# Patient Record
Sex: Male | Born: 1980 | Race: White | Hispanic: No | Marital: Married | Smoking: Former smoker
Health system: Southern US, Community
[De-identification: ages and names within clinical notes are randomized; demographics above are authoritative.]

## PROBLEM LIST (undated history)

## (undated) ENCOUNTER — Inpatient Hospital Stay: Admission: EM | Payer: Self-pay | Source: Home / Self Care

## (undated) DIAGNOSIS — F329 Major depressive disorder, single episode, unspecified: Secondary | ICD-10-CM

## (undated) DIAGNOSIS — K219 Gastro-esophageal reflux disease without esophagitis: Secondary | ICD-10-CM

## (undated) DIAGNOSIS — F419 Anxiety disorder, unspecified: Secondary | ICD-10-CM

## (undated) DIAGNOSIS — E039 Hypothyroidism, unspecified: Secondary | ICD-10-CM

## (undated) DIAGNOSIS — A4902 Methicillin resistant Staphylococcus aureus infection, unspecified site: Secondary | ICD-10-CM

## (undated) DIAGNOSIS — F1011 Alcohol abuse, in remission: Secondary | ICD-10-CM

## (undated) DIAGNOSIS — F32A Depression, unspecified: Secondary | ICD-10-CM

## (undated) DIAGNOSIS — A630 Anogenital (venereal) warts: Secondary | ICD-10-CM

## (undated) HISTORY — DX: Major depressive disorder, single episode, unspecified: F32.9

## (undated) HISTORY — DX: Gastro-esophageal reflux disease without esophagitis: K21.9

## (undated) HISTORY — DX: Anxiety disorder, unspecified: F41.9

## (undated) HISTORY — DX: Alcohol abuse, in remission: F10.11

## (undated) HISTORY — DX: Depression, unspecified: F32.A

## (undated) HISTORY — DX: Methicillin resistant Staphylococcus aureus infection, unspecified site: A49.02

## (undated) HISTORY — DX: Hypothyroidism, unspecified: E03.9

## (undated) HISTORY — DX: Anogenital (venereal) warts: A63.0

## (undated) HISTORY — PX: WRIST SURGERY: SHX841

---

## 2001-07-03 ENCOUNTER — Encounter: Admission: RE | Admit: 2001-07-03 | Discharge: 2001-07-03 | Payer: Self-pay | Admitting: Sports Medicine

## 2001-08-11 ENCOUNTER — Encounter: Admission: RE | Admit: 2001-08-11 | Discharge: 2001-08-11 | Payer: Self-pay | Admitting: Sports Medicine

## 2001-09-04 ENCOUNTER — Encounter: Admission: RE | Admit: 2001-09-04 | Discharge: 2001-09-04 | Payer: Self-pay | Admitting: Sports Medicine

## 2001-09-11 ENCOUNTER — Encounter: Admission: RE | Admit: 2001-09-11 | Discharge: 2001-09-11 | Payer: Self-pay | Admitting: Sports Medicine

## 2003-06-14 ENCOUNTER — Encounter: Admission: RE | Admit: 2003-06-14 | Discharge: 2003-06-14 | Payer: Self-pay | Admitting: Sports Medicine

## 2006-03-10 ENCOUNTER — Ambulatory Visit: Payer: Self-pay | Admitting: Gastroenterology

## 2006-03-10 LAB — CONVERTED CEMR LAB
AST: 25 units/L (ref 0–37)
Albumin: 4.4 g/dL (ref 3.5–5.2)
Amylase: 60 units/L (ref 27–131)
BUN: 13 mg/dL (ref 6–23)
Basophils Absolute: 0 10*3/uL (ref 0.0–0.1)
Basophils Relative: 0.6 % (ref 0.0–1.0)
CO2: 32 meq/L (ref 19–32)
Calcium: 9.7 mg/dL (ref 8.4–10.5)
Eosinophils Relative: 1.6 % (ref 0.0–5.0)
GFR calc Af Amer: 151 mL/min
GFR calc non Af Amer: 125 mL/min
HCT: 49.9 % (ref 39.0–52.0)
Lymphocytes Relative: 39 % (ref 12.0–46.0)
MCHC: 35 g/dL (ref 30.0–36.0)
MCV: 91.8 fL (ref 78.0–100.0)
Monocytes Absolute: 0.7 10*3/uL (ref 0.2–0.7)
Monocytes Relative: 12.1 % — ABNORMAL HIGH (ref 3.0–11.0)
Neutrophils Relative %: 46.7 % (ref 43.0–77.0)
Platelets: 240 10*3/uL (ref 150–400)
Potassium: 4 meq/L (ref 3.5–5.1)
RBC: 5.43 M/uL (ref 4.22–5.81)
Sed Rate: 2 mm/hr (ref 0–20)
Sodium: 145 meq/L (ref 135–145)
Tissue Transglutaminase Ab, IgA: <3 units (ref <5)
Total Bilirubin: 0.8 mg/dL (ref 0.3–1.2)
WBC: 6 10*3/uL (ref 4.5–10.5)

## 2006-03-17 ENCOUNTER — Encounter (INDEPENDENT_AMBULATORY_CARE_PROVIDER_SITE_OTHER): Payer: Self-pay | Admitting: Specialist

## 2006-03-17 ENCOUNTER — Ambulatory Visit: Payer: Self-pay | Admitting: Gastroenterology

## 2006-03-17 LAB — CONVERTED CEMR LAB: HCV Ab: NEGATIVE

## 2006-03-21 ENCOUNTER — Ambulatory Visit: Payer: Self-pay | Admitting: Gastroenterology

## 2006-03-31 ENCOUNTER — Ambulatory Visit: Payer: Self-pay | Admitting: Gastroenterology

## 2006-04-01 ENCOUNTER — Ambulatory Visit: Payer: Self-pay | Admitting: Gastroenterology

## 2006-04-25 ENCOUNTER — Ambulatory Visit: Payer: Self-pay | Admitting: Gastroenterology

## 2006-07-01 ENCOUNTER — Ambulatory Visit: Payer: Self-pay | Admitting: Gastroenterology

## 2006-07-01 LAB — CONVERTED CEMR LAB
ALT: 26 units/L (ref 0–40)
AST: 22 units/L (ref 0–37)
Albumin: 4.2 g/dL (ref 3.5–5.2)
Alkaline Phosphatase: 57 units/L (ref 39–117)
Bilirubin, Direct: 0.1 mg/dL (ref 0.0–0.3)
Total Bilirubin: 1 mg/dL (ref 0.3–1.2)
Total Protein: 7 g/dL (ref 6.0–8.3)

## 2010-03-28 ENCOUNTER — Ambulatory Visit: Payer: Self-pay | Admitting: Infectious Diseases

## 2010-03-29 ENCOUNTER — Encounter: Payer: Self-pay | Admitting: *Deleted

## 2010-03-29 DIAGNOSIS — IMO0002 Reserved for concepts with insufficient information to code with codable children: Secondary | ICD-10-CM | POA: Insufficient documentation

## 2010-04-02 ENCOUNTER — Telehealth: Payer: Self-pay | Admitting: Infectious Diseases

## 2010-04-02 ENCOUNTER — Encounter: Payer: Self-pay | Admitting: Infectious Diseases

## 2010-04-02 ENCOUNTER — Ambulatory Visit (INDEPENDENT_AMBULATORY_CARE_PROVIDER_SITE_OTHER): Payer: BC Managed Care – PPO | Admitting: Infectious Diseases

## 2010-04-02 ENCOUNTER — Other Ambulatory Visit: Payer: Self-pay | Admitting: Infectious Diseases

## 2010-04-02 DIAGNOSIS — IMO0002 Reserved for concepts with insufficient information to code with codable children: Secondary | ICD-10-CM

## 2010-04-02 LAB — CONVERTED CEMR LAB
BUN: 23 mg/dL (ref 6–23)
CO2: 28 meq/L (ref 19–32)
Calcium: 9.3 mg/dL (ref 8.4–10.5)
Compl, Total (CH50): >60 — ABNORMAL HIGH (ref 31–60)
Creatinine, Ser: 0.95 mg/dL (ref 0.40–1.50)
Eosinophils Absolute: 0.2 10*3/uL (ref 0.0–0.7)
Eosinophils Relative: 2 % (ref 0–5)
Glucose, Bld: 93 mg/dL (ref 70–99)
HCT: 47 % (ref 39.0–52.0)
HIV 1 RNA Quant: <20 copies/mL (ref <20)
HIV-1 RNA Quant, Log: <1.3 (ref <1.30)
Hemoglobin: 16.6 g/dL (ref 13.0–17.0)
IgA: 219 mg/dL (ref 68–378)
Lymphocytes Relative: 35 % (ref 12–46)
Lymphs Abs: 2.8 10*3/uL (ref 0.7–4.0)
MCV: 89.9 fL (ref 78.0–100.0)
Monocytes Relative: 11 % (ref 3–12)
RBC: 5.23 M/uL (ref 4.22–5.81)
Total Bilirubin: 0.6 mg/dL (ref 0.3–1.2)
WBC: 8 10*3/uL (ref 4.0–10.5)

## 2010-04-03 LAB — T-HELPER CELL (CD4) - (RCID CLINIC ONLY): CD4 T Cell Abs: 1260 uL (ref 400–2700)

## 2010-04-03 NOTE — Miscellaneous (Addendum)
Summary: meds  Medications Added CHANTIX STARTING MONTH PAK 0.5 MG X 11 & 1 MG X 42 TABS (VARENICLINE TARTRATE) per instructions CYMBALTA 30 MG CPEP (DULOXETINE HCL) take 3 caps orally once a day PREDNISONE 5 MG TABS (PREDNISONE) 1 pack was dispensed. take as directed      Allergies Added: NKDA Clinical Lists Changes  Problems: Added new problem of CELLULITIS, ARM (ICD-682.3) Added new problem of DEPRESSIVE DISORDER NOT ELSEWHERE CLASSIFIED (ICD-311) Medications: Added new medication of CHANTIX STARTING MONTH PAK 0.5 MG X 11 & 1 MG X 42 TABS (VARENICLINE TARTRATE) per instructions Added new medication of CYMBALTA 30 MG CPEP (DULOXETINE HCL) take 3 caps orally once a day Added new medication of CEFTIN 500 MG TABS (CEFUROXIME AXETIL) one tab two times a day Added new medication of MUCINEX DM 30-600 MG XR12H-TAB (DEXTROMETHORPHAN-GUAIFENESIN) take one or tabs by mouth twice a day Added new medication of 12 HOUR NASAL SPRAY 0.05 % SOLN (OXYMETAZOLINE HCL) take 2 sprays intranasally twice a day for 3 days. Added new medication of SINUS RINSE KIT  PACK (HYPERTONIC NASAL WASH) take one application in each affected nostril 1 or 2 times a day for 30 days Added new medication of AVELOX ABC PACK 400 MG TABS (MOXIFLOXACIN HCL) take one tablet per day Added new medication of MUCINEX D 60-600 MG XR12H-TAB (PSEUDOEPHEDRINE-GUAIFENESIN) take one tab orally two times a day for stuffines & congestion Added new medication of AUGMENTIN 875-125 MG TABS (AMOXICILLIN-POT CLAVULANATE) 1 tab two times a day Added new medication of PREDNISONE 5 MG TABS (PREDNISONE) 1 pack was dispensed. take as directed Observations: Added new observation of NKA: T (03/29/2010 11:08)

## 2010-04-05 ENCOUNTER — Telehealth: Payer: Self-pay | Admitting: *Deleted

## 2010-04-10 NOTE — Progress Notes (Signed)
Summary: Send rxes to [facility] and answer pt. questions  Phone Note Call from Patient Call back at cell [phone]   Caller: Patient Call For: Johny Sax, MD Reason for Call: Talk to Doctor Summary of Call: Pt. would appreciate the anibiotic ointment and surgical scrub [facility] [address]  Pt. also had a question about his continuing flu-like symptoms.  Is there something he should be doing or taking to relieve the symptoms?  Please call the pt. Jennet Maduro RN  April 02, 2010 3:23 PM   Follow-up for Phone Call        rx's sent electronically. will watch his flu symptoms. can try zyrtec, ibuprofen.     New/Updated Medications: MUPIROCIN 2 % OINT (MUPIROCIN) apply pea sized amt, intrasally, two times a day for the first 5 days of each month for the next 5 months. BETASEPT SURGICAL SCRUB 4 % LIQD (CHLORHEXIDINE GLUCONATE) scrub entire body except face, once weekly Prescriptions: BETASEPT SURGICAL SCRUB 4 % LIQD (CHLORHEXIDINE GLUCONATE) scrub entire body except face, once weekly  #30 days x 3   Entered and Authorized by:   Johny Sax MD   Signed by:   Johny Sax MD on 04/02/2010   Method used:   [facility] to        [facility] [address] [phone]* (retail)       [address]       [place], [place]  [address]       Ph: [phone] or [phone]       Fax: [phone]   RxID:   [id number] MUPIROCIN 2 % OINT (MUPIROCIN) apply pea sized amt, intrasally, two times a day for the first 5 days of each month for the next 5 months.  #30 days x 1   Entered and Authorized by:   Johny Sax MD   Signed by:   Johny Sax MD on 04/02/2010   Method used:   Electronically to        [facility] [address] [phone]* (retail)       [address]       [place], [place]  [address]       Ph: [phone] or [phone]       Fax: [phone]   RxID:   [id number]

## 2010-04-10 NOTE — Assessment & Plan Note (Signed)
Summary: Office Visit - Infectious Disease   CC:  new pt. recurrent MRSA and pt. c/o headache and sorethroat x 2 weeks.  History of Present Illness: 30 yo M with hx of recurrent MRSA infections. prev treated with Avelox, cipro, ceftin, bactrim. Two episodes on forearm, once on his thigh. Has never had a Cx. No fevers or chills, sweating still (from previous viral gastroenteritis).  States he is getting sick once a month- sinus infections, stomach virus.  Soap- "manbar".   Depression History:      The patient denies a depressed mood most of the day and a diminished interest in his usual daily activities.        The patient denies that he feels like life is not worth living, denies that he wishes that he were dead, and denies that he has thought about ending his life.        Preventive Screening-Counseling & Management  Alcohol-Tobacco     Alcohol drinks/day: 0     Smoking Status: quit     Year Quit: 2010  Caffeine-Diet-Exercise     Caffeine use/day: coffee and soda 6 per day     Does Patient Exercise: yes     Type of exercise: lift weights, cardio     Times/week: 3  Comments: pt. uses nicotine lozenges  Safety-Violence-Falls     Seat Belt Use: yes      Drug Use:  no.     Current Allergies (reviewed today): No known allergies  Family History: sister with DM dx at  55 Father with CAD, PTCA  Social History: Single Former Smoker Alcohol use-no Drug use-no Drug Use:  no  Current Medications (verified): 1)  Cymbalta 20 Mg Cpep (Duloxetine Hcl) .... Take 1 Tablet By Mouth Once A Day  Allergies (verified): No Known Drug Allergies   Review of Systems       BM nl, urination nl, has skin "bumps" pimples on chest and pelvis. wt is steady. lifts weights daily.   Vital Signs:  Patient profile:   30 year old male Height:      71 inches (180.34 cm) Weight:      185.8 pounds (84.45 kg) BMI:     26.01 Temp:     98.4 degrees F (36.89 degrees C) oral Pulse rate:   80 /  minute BP sitting:   147 / 81  (right arm)  Vitals Entered By: Wendall Mola CMA Duncan Dull) (April 02, 2010 2:16 PM) CC: new pt. recurrent MRSA, pt. c/o headache and sorethroat x 2 weeks Is Patient Diabetic? No Pain Assessment Patient in pain? no      Nutritional Status BMI of 25 - 29 = overweight Nutritional Status Detail appetite "terrible"  Have you ever been in a relationship where you felt threatened, hurt or afraid?No   Does patient need assistance? Functional Status Self care Ambulation Normal   Physical Exam  General:  well-developed, well-nourished, and well-hydrated.   Eyes:  pupils equal, pupils round, and pupils reactive to light.   Mouth:  pharynx pink and moist and no exudates.   Neck:  no masses.   Lungs:  normal respiratory effort and normal breath sounds.   Heart:  normal rate, regular rhythm, and no murmur.   Abdomen:  soft, non-tender, and normal bowel sounds.   Extremities:  has healed furuncle scar o nihs R inner thigh.    Impression & Recommendations:  Problem # 1:  CELLULITIS, ARM (ICD-682.3)  he does not have any active  lesions currently. WIll start him on mupicocin intranasal two times a day for the first 5 days of the month for the next 3 months. Advised him to use  an antibacterial soap. Offered to write him a rx for chlorhexidine soap but will hold off for He would like to be screened for "everything" with regards to possible recurrent infections. He had a negative HIV 3 months ago at rehab. Will rechck that and check his AB levels and CH50. return to clinic 2 weeks.   Orders: T-HIV Viral Load [phone]) T-Comprehensive Metabolic Panel [phone]) T-CBC w/Diff [phone]) T-CD4SP ([facility]) (CD4SP) T- * Misc. Laboratory test [phone]) Consultation Level IV [phone])  Medications Added to Medication List This Visit: 1)  Cymbalta 20 Mg Cpep (Duloxetine hcl) .... Take 1 tablet by mouth once a day

## 2010-04-10 NOTE — Progress Notes (Signed)
   Phone Note Call from Patient   Caller: Patient Summary of Call: states he has been sick for a wk & it is now moving to his chest. c/o stuffed head & sinus. blowing green mucous. 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 saw ENT 2 yrs ago & was told to "tough it out" wants to be seen. transferred to front to get an appt. told him if none today of tomorrow, use UC. he agreed with plan Initial call taken by: Golden Circle RN,  April 05, 2010 10:16 AM

## 2010-04-13 ENCOUNTER — Telehealth: Payer: Self-pay | Admitting: *Deleted

## 2010-04-13 NOTE — Telephone Encounter (Signed)
C/o L upper arm MRSA. States he has a long hx of this. Wanted to be seen or have something called in. Told him we have no appts & he would need to be seen. He will go to UC. Has appt here early April.Jacob [name], Jacob [name]

## 2010-04-23 ENCOUNTER — Ambulatory Visit (INDEPENDENT_AMBULATORY_CARE_PROVIDER_SITE_OTHER): Payer: BC Managed Care – PPO | Admitting: Infectious Diseases

## 2010-04-23 ENCOUNTER — Encounter: Payer: Self-pay | Admitting: Infectious Diseases

## 2010-04-23 VITALS — BP 151/75 | HR 66 | Temp 98.2°F | Ht 71.0 in | Wt 185.4 lb

## 2010-04-23 DIAGNOSIS — IMO0002 Reserved for concepts with insufficient information to code with codable children: Secondary | ICD-10-CM

## 2010-04-23 NOTE — Assessment & Plan Note (Signed)
He has had a recurnece. Exam of his arm does not show a lesion that i am convinced was a MRSA boil. He needs to continue the bactroban, CHG baths. He is given a prn rx for doxy. Will rtc prn. i have asked him to be seen by a dermatologist or to try topical hydrocortisone for his rash.

## 2010-04-23 NOTE — Progress Notes (Signed)
  Subjective:    Patient ID: LAKSHYA MCGILLICUDDY, male    DOB: 1980-03-19, 30 y.o.   MRN: [id number]  HPI 30 yo M with hx of recurrent MRSA infections which had previously with Avelox, cipro, ceftin and bactrim. Has  Prev had episodes on forearm, thigh. No previous Cxs. He was prev started on (04-02-10) mupirocin, chlorhexidine baths. His Immunoglobulin levels, CH50, HIV RNA were all nl/negative. Had small lesion on his LUE and saw urgent care. Was given 10 days of bactrim and topical (? Can't rember name).  Also recently treated for sinus infection.  Has new rash on his UE over the last 24 hours. No new soaps or medicines (except bactrim). Pruritic.    Review of Systems     Objective:   Physical Exam  Skin: Skin is warm and dry. Rash noted.             Assessment & Plan:

## 2010-06-08 NOTE — Assessment & Plan Note (Signed)
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   HISTORY OF PRESENT ILLNESS:  Slayter is accompanied by his mother today  and I have a somewhat detailed history. He apparently has had a queasy  stomach since childhood. He has also had recurrent episodes of  constipation throughout his life usually when traveling and not watching  his diet. The patient has had a long history of musculoskeletal pains  related to his athleticism and has taken large amounts of Advil which  has probably contributed to his epigastric discomfort. He currently  describes rather typical dyspepsia with reflux symptoms but no dysphagia  or specific hepatobiliary complaints. He was seen at an emergency  medical care center recently. He was found to have a positive H. pylori  antibody and has been taking a Pylora pack with Pepto-Bismol and  antibiotics. He has had no real improvement in his symptoms with this  treatment. Over the last several years he has had recurrent episodes of  dysentery while traveling to [place] and [place], but currently is not  having any bowel problems. He has had 13 pound weight loss over the last  year and he follows a frequent small feeding diet.   The patient has never had an endoscopy or upper GI series. He denies any  specific food intolerances or lactose intolerance.   PAST MEDICAL HISTORY:  Otherwise fairly unremarkable except for a  chronic anxiety disorder and he has been on clonazepam twice a day for  several years. He also takes Adderall daily for attention  deficit  disorder and has recently been on over-the-counter Prilosec. He denies  drug allergies.   FAMILY HISTORY:  Remarkable for peptic ulcer disease in several family  members including his father, mother and siblings. Apparently his father  has some type of abnormal chronic pancreatitis.   SOCIAL HISTORY:  The patient is single and lives with his parents. He  has a Naval architect. He is employed as [employer] of  [employer]. The patient smokes a pack of cigarettes per day and  uses ethanol socially but gives no history of ethanol abuse.   REVIEW OF SYSTEMS:  Noncontributory. He has had acne skin-like rash all  of his life and previously was on doxicycline. He gives no past history  of hepatitis or known pancreatitis. He does have a high stress level and  tends to get GI problems associated with stress. He denies any  hallucinations or delusions or other psychiatric difficulties.   LABORATORY DATA:  He apparently had some lab tests which are not  available a this time. Ultrasound of the abdomen in [place] [facility] was normal on February 28, 2006 per Dr. Onalee Hua  Ormond.   PHYSICAL EXAMINATION:  GENERAL:  He is a somewhat nervous but healthy-  appearing, white male in no acute distress. He does not have stigmata of  chronic liver disease but does have a fine acneiform rash over his back.  VITAL SIGNS:  He is 5 feet 10 inches tall and weighs 150 pounds. Blood  pressure 122/78, pulse was 88 and regular.  HEENT:  I could not appreciate lymphadenopathy or thyromegaly.  CHEST:  Clear.  CARDIAC:  Regular rhythm without murmur, gallop or rub.  ABDOMEN:  I could not appreciate hepatosplenomegaly, abdominal masses or  tenderness. Bowel sounds were normal.  EXTREMITIES:  Peripheral extremities were unremarkable.  NEUROLOGIC:  Mental status was clear.  RECTAL:  Deferred at this time.   ASSESSMENT:  1. The patient's discomfort seems to be localized  in the subxiphoid      area and I suspect he has chronic reflux esophagitis with an      associated hiatal hernia.  2. I doubt that the patient has active H. pylori infection or peptic      ulcer disease but this certainly is a possibility with his NSAID      use.  3. 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  4. History of recurrent dysentery with his travels to [place] and      [place]. He does need to be screened for intestinal parasites.  5. History of chronic anxiety syndrome with a diagnosis of adult      attention deficit disorder.   RECOMMENDATIONS:  1. Screen and laboratory parameters.  2. Stool examinations for parasites and culture.  3. Discontinue H. pylori treatment and placed him on Nexium 40 mg a      day.  4. Endoscopic exam with small bowel biopsy and CLOtest as soon as      possible.  5. Consider colonoscopy exam.  6. Avoidance of NSAIDs and salicylates if at all possible.  7. The patient will need smoking cessation counseling once      gastrointestinal workup is completed.     Vania Rea. Jarold Motto, MD, Caleen Essex, FAGA  Electronically Signed    DRP/MedQ  DD: 03/10/2006  DT: 03/10/2006  Job #: [id number]

## 2010-06-08 NOTE — Assessment & Plan Note (Signed)
[facility]                         GASTROENTEROLOGY OFFICE NOTE   DETRON, CARRAS                  MRN:          [id number]  DATE:04/25/2006                            DOB:          1980/02/09    Jacob [name] is fairly asymptomatic in terms of his GI complaints. 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 Since he is a heterozygote, he really has no risk of  hemachromatosis or iron overload but I have given him a copy of the  report for his information and for genetic counseling.   The patient is on Adderall 40-60 mg a day and Clonazepam 1.5 mg a day  for attention deficit disorder and anxiety attacks. He wishes to come  off of these medications, and I have given him Dr. Dawayne Cirri card,  arranged a visit with Dr. Dellia [name] to counsel him along these lines. I  will have him repeat his liver function tests in 3 months' time with  continued absence from alcohol.     Jacob [name]. Jacob Motto, MD, Caleen Essex, FAGA  Electronically Signed    Jacob [name]  DD: 04/25/2006  DT: 04/25/2006  Job #: [id number]   cc:   Jacob Hua L. Dellia Cloud, Jacob [name]

## 2010-12-27 ENCOUNTER — Telehealth: Payer: Self-pay | Admitting: *Deleted

## 2010-12-27 DIAGNOSIS — Z0000 Encounter for general adult medical examination without abnormal findings: Secondary | ICD-10-CM

## 2010-12-27 NOTE — Telephone Encounter (Signed)
Received staff msg pt made cpx appt need labs. Entered in EPIC....12/27/10@12 :05am/LMB

## 2010-12-31 ENCOUNTER — Other Ambulatory Visit (INDEPENDENT_AMBULATORY_CARE_PROVIDER_SITE_OTHER): Payer: BC Managed Care – PPO

## 2010-12-31 DIAGNOSIS — Z0000 Encounter for general adult medical examination without abnormal findings: Secondary | ICD-10-CM

## 2010-12-31 LAB — LIPID PANEL
HDL: 62.7 mg/dL (ref >39.00)
Total CHOL/HDL Ratio: 3
Triglycerides: 64 mg/dL (ref 0.0–149.0)

## 2010-12-31 LAB — URINALYSIS, ROUTINE W REFLEX MICROSCOPIC
Hgb urine dipstick: NEGATIVE
Nitrite: NEGATIVE
Specific Gravity, Urine: 1.025 (ref 1.000–1.030)
Total Protein, Urine: NEGATIVE
Urobilinogen, UA: 0.2 (ref 0.0–1.0)

## 2010-12-31 LAB — CBC WITH DIFFERENTIAL/PLATELET
Eosinophils Relative: 2.9 % (ref 0.0–5.0)
Lymphocytes Relative: 49 % — ABNORMAL HIGH (ref 12.0–46.0)
MCV: 94.2 fl (ref 78.0–100.0)
Monocytes Absolute: 0.9 10*3/uL (ref 0.1–1.0)
Neutrophils Relative %: 34.2 % — ABNORMAL LOW (ref 43.0–77.0)
Platelets: 220 10*3/uL (ref 150.0–400.0)
WBC: 6.4 10*3/uL (ref 4.5–10.5)

## 2010-12-31 LAB — HEPATIC FUNCTION PANEL
AST: 28 U/L (ref 0–37)
Albumin: 4.1 g/dL (ref 3.5–5.2)
Alkaline Phosphatase: 65 U/L (ref 39–117)
Total Protein: 6.7 g/dL (ref 6.0–8.3)

## 2010-12-31 LAB — BASIC METABOLIC PANEL
BUN: 22 mg/dL (ref 6–23)
Calcium: 9 mg/dL (ref 8.4–10.5)
Chloride: 106 mEq/L (ref 96–112)
Creatinine, Ser: 0.9 mg/dL (ref 0.4–1.5)
GFR: 110.54 mL/min (ref >60.00)

## 2010-12-31 LAB — TSH: TSH: 6.24 u[IU]/mL — ABNORMAL HIGH (ref 0.35–5.50)

## 2011-01-02 ENCOUNTER — Encounter: Payer: Self-pay | Admitting: Internal Medicine

## 2011-01-02 ENCOUNTER — Ambulatory Visit (INDEPENDENT_AMBULATORY_CARE_PROVIDER_SITE_OTHER): Payer: BC Managed Care – PPO | Admitting: Internal Medicine

## 2011-01-02 VITALS — BP 110/82 | HR 74 | Temp 98.4°F | Ht 70.0 in | Wt 187.4 lb

## 2011-01-02 DIAGNOSIS — F329 Major depressive disorder, single episode, unspecified: Secondary | ICD-10-CM

## 2011-01-02 DIAGNOSIS — D72820 Lymphocytosis (symptomatic): Secondary | ICD-10-CM

## 2011-01-02 DIAGNOSIS — Z23 Encounter for immunization: Secondary | ICD-10-CM

## 2011-01-02 DIAGNOSIS — F32A Depression, unspecified: Secondary | ICD-10-CM

## 2011-01-02 DIAGNOSIS — Z0000 Encounter for general adult medical examination without abnormal findings: Secondary | ICD-10-CM

## 2011-01-02 DIAGNOSIS — R946 Abnormal results of thyroid function studies: Secondary | ICD-10-CM

## 2011-01-02 DIAGNOSIS — A4902 Methicillin resistant Staphylococcus aureus infection, unspecified site: Secondary | ICD-10-CM

## 2011-01-02 MED ORDER — SULFAMETHOXAZOLE-TRIMETHOPRIM 800-160 MG PO TABS: 1.0000 | ORAL_TABLET | Freq: Two times a day (BID) | ORAL | Status: DC

## 2011-01-02 NOTE — Patient Instructions (Signed)
It was good to see you today. Exam and labs overall look normal - will recheck thyroid and cbc in 3 months (lab only) Flu and Tdap shot today Refill Bactrim as needed for MRSA outbreaks Other medications as reviewed - no changes - Please schedule followup annually for labs and physical, call sooner if problems.

## 2011-01-02 NOTE — Progress Notes (Signed)
Subjective:    Patient ID: Jacob [name], male    DOB: January 30, 1980, 30 y.o.   MRN: [id number]  HPI patient is here today for annual physical. Patient feels well and has no complaints.  Reviewed chronic medical issues: Colonization with community acquired MRSA. Recurrent skin infections 3-4 times annually due to same - treated with Septra as needed and has been evaluated by ID for same. Not compliance with nasal treatment monthly as recommended by ID, uses Hibiclens as needed. No current skin problem  Depression. Overlaps with history of alcohol and substance abuse. Sober since January 2009. On Cymbalta since that time and works with substance counselor in [place], [place]. Denies active symptoms and would like to wean off medication but we'll continue to work with counselor on same   Past Medical History  Diagnosis Date  . H/O alcohol abuse   . Depression   . GERD (gastroesophageal reflux disease)   . Genital warts   . 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 Heart disease Father   . Hyperlipidemia Father   . Hypertension Father   . Diabetes Other   . Stroke Other   . Heart disease Other    History  Substance Use Topics  . Smoking status: Former Smoker    Types: Cigarettes  . Smokeless tobacco: Never Used  . Alcohol Use: No     Review of Systems Constitutional: Negative for fever or weight change.  Respiratory: Negative for cough and shortness of breath.   Cardiovascular: Negative for chest pain or palpitations.  Gastrointestinal: Negative for abdominal pain, no bowel changes.  Musculoskeletal: Negative for gait problem or joint swelling.  Skin: Negative for rash.  Neurological: Negative for dizziness or headache.  No other specific complaints in a complete review of systems (except as listed in HPI above).     Objective:   Physical Exam BP 110/82   Pulse 74  Temp(Src) 98.4 F (36.9 C) (Oral)  Ht 5\' 10"  (1.778 m)  Wt 187 lb 6.4 oz (85.004 kg)  BMI 26.89 kg/m2  SpO2 98% Wt Readings from Last 3 Encounters:  01/02/11 187 lb 6.4 oz (85.004 kg)  04/23/10 185 lb 6 oz (84.086 kg)  04/02/10 185 lb 12.8 oz (84.278 kg)   Constitutional:  He appears well-developed and well-nourished. No distress.  HEENT: Normocephalic/atraumatic, PERRLA. Tympanic membranes clear without effusion or erythema. Hearing grossly intact. Oropharynx clear without erythema or exudate Neck: Normal range of motion. Neck supple. No JVD or LAD present. No thyromegaly present.  Cardiovascular: Normal rate, regular rhythm and normal heart sounds.  No murmur heard. no BLE edema Pulmonary/Chest: Effort normal and breath sounds normal. No respiratory distress. no wheezes.  Abdominal: Soft. Bowel sounds are normal. Patient exhibits no distension. There is no tenderness.  Musculoskeletal: Normal range of motion. Patient exhibits no edema.  Neurological: he is alert and oriented to person, place, and time. No cranial nerve deficit. Coordination normal.  Skin: small scattered keloids on anterior chest - no purulence or cellulitis - remaining skin is warm and dry.  No erythema or ulceration.  Psychiatric: he has a normal mood and affect. behavior is normal. Judgment and thought content normal.   Lab Results  Component Value Date   WBC 6.4 12/31/2010   HGB 16.2 12/31/2010   HCT 46.7 12/31/2010   PLT 220.0 12/31/2010   GLUCOSE 91  12/31/2010   CHOL 180 12/31/2010   TRIG 64.0 12/31/2010   HDL 62.70 12/31/2010   LDLCALC 105* 12/31/2010   ALT 28 12/31/2010   AST 28 12/31/2010   NA 141 12/31/2010   K 4.4 12/31/2010   CL 106 12/31/2010   CREATININE 0.9 12/31/2010   BUN 22 12/31/2010   CO2 27 12/31/2010   TSH 6.24* 12/31/2010       Assessment & Plan:  CPX - v70.0 -Patient has been counseled on age-appropriate routine health concerns for screening and prevention. These are  reviewed and up-to-date. Immunizations are up-to-date or declined. Labs  reviewed.  Abnormal TSH -reports multiple herbal and energy supplement use for health. Advised cessation of any herbal medications. In the absence of active symptoms, will recheck TSH in 3 months to monitor for evolving hypothyroidism, but no treatment to begin at this time

## 2011-01-03 ENCOUNTER — Encounter: Payer: Self-pay | Admitting: Internal Medicine

## 2011-01-03 DIAGNOSIS — A4902 Methicillin resistant Staphylococcus aureus infection, unspecified site: Secondary | ICD-10-CM | POA: Insufficient documentation

## 2011-01-03 NOTE — Assessment & Plan Note (Signed)
On Cymbalta since 2009 to help combat depression symptoms following sobriety Works with Nena Jordan substance counselor in [place] for same Currently at 5 month interval visits, has weaned down off Max dose Cymbalta over past several years Expresses interest in medication cessation but will continue to work with his counselor/provider on this issue No changes recommended at this time

## 2011-01-03 NOTE — Assessment & Plan Note (Signed)
History reviewed including ID notes spring 2012 in the electronic record. No active infectious symptoms Reviewed importance of compliance with antibiotic nasal cleansing for eradication of colonization if possible Also renewed Septra to use as needed as previously had standing prescription for same from infectious disease specialist

## 2011-01-09 ENCOUNTER — Encounter: Payer: Self-pay | Admitting: Internal Medicine

## 2011-01-09 ENCOUNTER — Ambulatory Visit (INDEPENDENT_AMBULATORY_CARE_PROVIDER_SITE_OTHER): Payer: BC Managed Care – PPO | Admitting: Internal Medicine

## 2011-01-09 VITALS — BP 102/60 | HR 67 | Temp 98.8°F

## 2011-01-09 DIAGNOSIS — K120 Recurrent oral aphthae: Secondary | ICD-10-CM

## 2011-01-09 MED ORDER — TRIAMCINOLONE ACETONIDE 0.1 % MT PSTE: PASTE | Freq: Two times a day (BID) | OROMUCOSAL | Status: DC

## 2011-01-09 NOTE — Patient Instructions (Addendum)
It was good to see you today. Use oral paste as needed for your canker sores - Your prescription(s) have been submitted to your pharmacy. Please take as directed and contact our office if you believe you are having problem(s) with the medication(s).

## 2011-01-09 NOTE — Progress Notes (Signed)
  Subjective:    Patient ID: Jacob [name], male    DOB: 1980-08-26, 30 y.o.   MRN: [id number]  HPI  complains of mouth sores Onset 3 days ago -  Located on roof of mouth denies trauma - physical or thermal Hx same but usual on inside cheeks No tooth pain  Past Medical History  Diagnosis Date  . H/O alcohol abuse     sober since 02/04/2007  . Depression   . GERD (gastroesophageal reflux disease)   . Genital warts   . Community acquired MRSA infection     recurrent, 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 - follows with [facility]     Review of Systems  Constitutional: Negative for fever and fatigue.  HENT: Positive for mouth sores. Negative for sore throat, drooling, trouble swallowing and dental problem.        Objective:   Physical Exam BP 102/60  Pulse 67  Temp(Src) 98.8 F (37.1 C) (Oral)  SpO2 96% Wt Readings from Last 3 Encounters:  01/02/11 187 lb 6.4 oz (85.004 kg)  04/23/10 185 lb 6 oz (84.086 kg)  04/02/10 185 lb 12.8 oz (84.278 kg)   Gen: NAD - nontoxic HENT: NCAT - mouth with aphthous ulcers x 3 2-106mm size on roof of mouth - gums, tongue and lips normal - OP clear without erythema or vessicles Skin - no rash or ulcers      Assessment & Plan:  Aphthous ulcers - steroid mouth paste and education/reassurance provided

## 2011-01-14 ENCOUNTER — Ambulatory Visit: Payer: BC Managed Care – PPO | Admitting: Internal Medicine

## 2011-02-13 ENCOUNTER — Ambulatory Visit (INDEPENDENT_AMBULATORY_CARE_PROVIDER_SITE_OTHER): Payer: BC Managed Care – PPO | Admitting: Internal Medicine

## 2011-02-13 ENCOUNTER — Encounter: Payer: Self-pay | Admitting: Internal Medicine

## 2011-02-13 DIAGNOSIS — L739 Follicular disorder, unspecified: Secondary | ICD-10-CM

## 2011-02-13 DIAGNOSIS — L0293 Carbuncle, unspecified: Secondary | ICD-10-CM

## 2011-02-13 DIAGNOSIS — A4902 Methicillin resistant Staphylococcus aureus infection, unspecified site: Secondary | ICD-10-CM

## 2011-02-13 DIAGNOSIS — L0292 Furuncle, unspecified: Secondary | ICD-10-CM

## 2011-02-13 NOTE — Progress Notes (Signed)
  Subjective:    Patient ID: Jacob [name], male    DOB: 1980-03-18, 31 y.o.   MRN: [id number]  HPI  Here for red spot on R dorsum of foot - Onset yesterday No itch, bite or trauma  Non tender, not expanding redness, no "pus" ?MRSA given hx same  Past Medical History  Diagnosis Date  . H/O alcohol abuse     sober since 02/04/2007  . Depression   . GERD (gastroesophageal reflux disease)   . Genital warts   . 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       Objective:   Physical Exam BP 136/78  Pulse 64  Temp(Src) 98.6 F (37 C) (Oral)  SpO2 97% Wt Readings from Last 3 Encounters:  01/02/11 187 lb 6.4 oz (85.004 kg)  04/23/10 185 lb 6 oz (84.086 kg)  04/02/10 185 lb 12.8 oz (84.278 kg)   GEN: NAD Skin - dorsum of foot with mild single focus of folliculitis, no purulence or cellulitis - nontender      Assessment & Plan:  Folliculitis - reassurance provided - conservative care/education, call if worse  Hx caMRSA - will culture for future episodes as needed

## 2011-02-13 NOTE — Patient Instructions (Signed)
It was good to see you today. No indication for antibiotic pills at this time - if increasing redness, fever or pain in next 72h, or if any "pus", call for office visit and culture swab Treat irritated hair follicle as discussed: foot soak, wash with soap/warm water and cover with antibiotics ointment and bandaid to avoid friction to this area

## 2011-04-10 ENCOUNTER — Other Ambulatory Visit (INDEPENDENT_AMBULATORY_CARE_PROVIDER_SITE_OTHER): Payer: BC Managed Care – PPO

## 2011-04-10 DIAGNOSIS — D72820 Lymphocytosis (symptomatic): Secondary | ICD-10-CM

## 2011-04-10 DIAGNOSIS — R946 Abnormal results of thyroid function studies: Secondary | ICD-10-CM

## 2011-04-10 LAB — CBC WITH DIFFERENTIAL/PLATELET
Basophils Relative: 0.6 % (ref 0.0–3.0)
Eosinophils Absolute: 0.2 10*3/uL (ref 0.0–0.7)
Eosinophils Relative: 3.5 % (ref 0.0–5.0)
HCT: 48.5 % (ref 39.0–52.0)
Lymphs Abs: 2.5 10*3/uL (ref 0.7–4.0)
MCHC: 34.1 g/dL (ref 30.0–36.0)
MCV: 93.9 fl (ref 78.0–100.0)
Monocytes Absolute: 0.6 10*3/uL (ref 0.1–1.0)
RBC: 5.17 Mil/uL (ref 4.22–5.81)
WBC: 5.5 10*3/uL (ref 4.5–10.5)

## 2011-08-06 ENCOUNTER — Other Ambulatory Visit: Payer: Self-pay

## 2011-08-06 MED ORDER — CIPROFLOXACIN HCL 500 MG PO TABS: 500.0000 mg | ORAL_TABLET | Freq: Two times a day (BID) | ORAL | Status: AC

## 2011-08-06 NOTE — Telephone Encounter (Signed)
Pt called stating he will be leaving to [place] Friday 07/19. Pt is requesting a Rx for Cipro to take with him in case he gets sick while out of the country.

## 2011-08-06 NOTE — Telephone Encounter (Signed)
Pt advised of Rx/pharmacy 

## 2011-08-06 NOTE — Telephone Encounter (Signed)
Ok: cipro 500 bid x 1 week prn diarrhea

## 2011-08-19 ENCOUNTER — Telehealth: Payer: Self-pay | Admitting: Internal Medicine

## 2011-08-19 NOTE — Telephone Encounter (Signed)
Caller: Efe/Patient; PCP: Rene Paci; CB#: [phone]. Call regarding Would Like Shingles Vaccine; Caller reports his father has shingles and he would like to get vaccine to prevent same "after seeing him suffer." Caller advised this vaccine is indicated for those over 50 but a note will be sent with his request. He can be reached at above #.

## 2011-08-19 NOTE — Telephone Encounter (Signed)
Sorry - no indication to give shingles vaccine prior to age 31

## 2011-08-19 NOTE — Telephone Encounter (Signed)
Notified pt with md response... 08/19/11@1 :56pm/LMB

## 2011-10-17 ENCOUNTER — Ambulatory Visit (INDEPENDENT_AMBULATORY_CARE_PROVIDER_SITE_OTHER): Payer: BC Managed Care – PPO | Admitting: Internal Medicine

## 2011-10-17 ENCOUNTER — Encounter: Payer: Self-pay | Admitting: Internal Medicine

## 2011-10-17 VITALS — BP 120/74 | HR 54 | Temp 98.1°F | Resp 17 | Wt 175.8 lb

## 2011-10-17 DIAGNOSIS — L709 Acne, unspecified: Secondary | ICD-10-CM

## 2011-10-17 DIAGNOSIS — A4902 Methicillin resistant Staphylococcus aureus infection, unspecified site: Secondary | ICD-10-CM

## 2011-10-17 DIAGNOSIS — L708 Other acne: Secondary | ICD-10-CM

## 2011-10-17 DIAGNOSIS — K120 Recurrent oral aphthae: Secondary | ICD-10-CM

## 2011-10-17 MED ORDER — TRETINOIN 0.05 % EX CREA: TOPICAL_CREAM | Freq: Every day | CUTANEOUS | Status: DC

## 2011-10-17 MED ORDER — DULOXETINE HCL 30 MG PO CPEP: 30.0000 mg | ORAL_CAPSULE | Freq: Every day | ORAL | Status: DC

## 2011-10-17 MED ORDER — MAGIC MOUTHWASH W/LIDOCAINE: 5.0000 mL | Freq: Four times a day (QID) | ORAL | Status: DC | PRN

## 2011-10-17 NOTE — Progress Notes (Signed)
  Subjective:    Patient ID: Jacob [name], male    DOB: 01/07/1981, 31 y.o.   MRN: [id number]  HPI   Here for red spots on chest - typical of prior MRSA outbreaks - Onset 2 weeks ago Improving with septra begun 1 week ago -  Using nasal spray as rx'd requests refer to [facility] for 2nd opinion on ?eradication  Also ?tx for acne - on amox now - ?renew prev cream per derm  Past Medical History  Diagnosis Date  . H/O alcohol abuse     sober since 02/04/2007  . Depression   . GERD (gastroesophageal reflux disease)   . Genital warts   . 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  HENT: Positive for mouth sores. Negative for sore throat, trouble swallowing and dental problem.   Respiratory: Negative for cough and shortness of breath.   Cardiovascular: Negative for chest pain and palpitations.  Skin: Positive for wound. Negative for rash.       Objective:   Physical Exam  BP 120/74  Pulse 54  Temp 98.1 F (36.7 C) (Oral)  Resp 17  Wt 175 lb 12 oz (79.72 kg)  SpO2 97% Wt Readings from Last 3 Encounters:  10/17/11 175 lb 12 oz (79.72 kg)  01/02/11 187 lb 6.4 oz (85.004 kg)  04/23/10 185 lb 6 oz (84.086 kg)   GEN: NAD, nontoxic HENT: apthous ulceration on roof of mouth - gums, lips teeth normal Skin - mild facial acne and mild folliculitis proximal BUE Anterior chest with several small red keloid nodules, L side lesion enlarged and inflammed (red and tender, evidence of purulence but no cellulitis)  Lab Results  Component Value Date   WBC 5.5 04/10/2011   HGB 16.5 04/10/2011   HCT 48.5 04/10/2011   PLT 220.0 04/10/2011   GLUCOSE 91 12/31/2010   CHOL 180 12/31/2010   TRIG 64.0 12/31/2010   HDL 62.70 12/31/2010   LDLCALC 105* 12/31/2010   ALT 28 12/31/2010   AST 28 12/31/2010   NA 141 12/31/2010   K 4.4 12/31/2010   CL 106 12/31/2010   CREATININE 0.9 12/31/2010   BUN 22 12/31/2010   CO2 27 12/31/2010   TSH 4.34 04/10/2011       Assessment & Plan:  Keloids with hx MRSA - recurrent infectious flare - ?HS as alternate dx? - continue septra given improvement and refer to ID at [facility] for 2nd opinion as requested  Acne - topical retin-a rx done - would prefer to avoid systemic antibiotics chronically given MRSA hx  apthous ulcer - prior tx with triamcinalone paste "too sticky" - use magic mouthwash with lidocaine as needed

## 2011-10-17 NOTE — Patient Instructions (Signed)
It was good to see you today. Stop amoxicillin and complete Septra Use Retin-A cream for acne Use Magic mouthwash for mouth pain and ulcers Your prescription(s) have been submitted to your pharmacy. Please take as directed and contact our office if you believe you are having problem(s) with the medication(s). we'll make referral to [facility] . Our office will contact you regarding appointment(s) once made.

## 2011-10-17 NOTE — Assessment & Plan Note (Signed)
History reviewed including ID notes spring 2012 in the electronic record. Improving on septra - begun 9/22 for chest infection (?complicating keloids from prior outbreaks) Reviewed importance of compliance with antibiotic nasal cleansing for eradication of colonization if possible Requests new infectious disease specialist input - refer to [facility] per request

## 2011-11-13 ENCOUNTER — Telehealth: Payer: Self-pay | Admitting: *Deleted

## 2011-11-13 DIAGNOSIS — M79603 Pain in arm, unspecified: Secondary | ICD-10-CM

## 2011-11-13 NOTE — Telephone Encounter (Signed)
Pt states that he is experiencing intense pain in both arms. Pt is currently a weightlifter and doesn't want to decrease or stop the activity and he has heard that cortisone would help. (Pt had surgery on both forearms about 10 years ago-tore tendons-played tennis)

## 2011-11-13 NOTE — Telephone Encounter (Signed)
Thank you for the update. Cortisone is not indicated for this reason. In fact, steroid use will increase his risk of repeat rupture the tendons. If he is having pain because of weight training, I recommend a sports medicine referral for evaluation of this problem. I will happily make this referral if desired. Thanks

## 2011-11-13 NOTE — Telephone Encounter (Signed)
I need more info: what symptoms is he experiencing that need treatment with cortisone? - thanks

## 2011-11-13 NOTE — Telephone Encounter (Signed)
Message copied by Carin Primrose on Wed Nov 13, 2011  8:21 AM ------      Message from: Newell Coral      Created: Wed Nov 13, 2011  8:08 AM       This pt is hoping to get cortisone shots in both his arms tomorrow. Is this okay?

## 2011-11-14 ENCOUNTER — Ambulatory Visit (INDEPENDENT_AMBULATORY_CARE_PROVIDER_SITE_OTHER): Payer: BC Managed Care – PPO | Admitting: Internal Medicine

## 2011-11-14 ENCOUNTER — Encounter: Payer: Self-pay | Admitting: Internal Medicine

## 2011-11-14 DIAGNOSIS — A4902 Methicillin resistant Staphylococcus aureus infection, unspecified site: Secondary | ICD-10-CM

## 2011-11-14 NOTE — Telephone Encounter (Signed)
Refer done.

## 2011-11-14 NOTE — Progress Notes (Signed)
A user error has taken place: encounter opened in error, closed for administrative reasons.

## 2011-11-14 NOTE — Telephone Encounter (Signed)
Notified pt with md response.../lmb 

## 2011-11-14 NOTE — Telephone Encounter (Signed)
Pt informed of MD's advisement. Pt would like referral to sports medicine.

## 2011-11-22 ENCOUNTER — Telehealth: Payer: Self-pay | Admitting: *Deleted

## 2011-11-22 DIAGNOSIS — Z0000 Encounter for general adult medical examination without abnormal findings: Secondary | ICD-10-CM

## 2011-11-22 NOTE — Telephone Encounter (Signed)
Received staff msg pt made cpx for December. Needing lab in epic...lmb

## 2011-11-22 NOTE — Telephone Encounter (Signed)
Message copied by Deatra James on Fri Nov 22, 2011  2:32 PM ------      Message from: Etheleen Sia      Created: Fri Nov 22, 2011  2:28 PM      Regarding: labs       PHYSICAL LABS IN DEC

## 2011-12-12 ENCOUNTER — Ambulatory Visit: Payer: BC Managed Care – PPO | Admitting: Sports Medicine

## 2012-01-01 ENCOUNTER — Other Ambulatory Visit (INDEPENDENT_AMBULATORY_CARE_PROVIDER_SITE_OTHER): Payer: BC Managed Care – PPO

## 2012-01-01 DIAGNOSIS — Z0000 Encounter for general adult medical examination without abnormal findings: Secondary | ICD-10-CM

## 2012-01-01 LAB — HEPATIC FUNCTION PANEL
Albumin: 4.3 g/dL (ref 3.5–5.2)
Total Protein: 7 g/dL (ref 6.0–8.3)

## 2012-01-01 LAB — URINALYSIS, ROUTINE W REFLEX MICROSCOPIC
Hgb urine dipstick: NEGATIVE
Leukocytes, UA: NEGATIVE
Specific Gravity, Urine: 1.025 (ref 1.000–1.030)
Urobilinogen, UA: 0.2 (ref 0.0–1.0)

## 2012-01-01 LAB — CBC WITH DIFFERENTIAL/PLATELET
Basophils Absolute: 0 10*3/uL (ref 0.0–0.1)
Eosinophils Absolute: 0.3 10*3/uL (ref 0.0–0.7)
HCT: 46.4 % (ref 39.0–52.0)
Hemoglobin: 15.9 g/dL (ref 13.0–17.0)
Lymphocytes Relative: 45 % (ref 12.0–46.0)
Lymphs Abs: 2.6 10*3/uL (ref 0.7–4.0)
MCHC: 34.1 g/dL (ref 30.0–36.0)
MCV: 92.2 fl (ref 78.0–100.0)
Monocytes Absolute: 0.8 10*3/uL (ref 0.1–1.0)
Neutro Abs: 2.2 10*3/uL (ref 1.4–7.7)
RDW: 12.8 % (ref 11.5–14.6)

## 2012-01-01 LAB — TSH: TSH: 7.08 u[IU]/mL — ABNORMAL HIGH (ref 0.35–5.50)

## 2012-01-01 LAB — BASIC METABOLIC PANEL
CO2: 28 mEq/L (ref 19–32)
Calcium: 8.7 mg/dL (ref 8.4–10.5)
Chloride: 102 mEq/L (ref 96–112)
Glucose, Bld: 88 mg/dL (ref 70–99)
Sodium: 139 mEq/L (ref 135–145)

## 2012-01-01 LAB — LIPID PANEL
HDL: 70.7 mg/dL (ref >39.00)
Triglycerides: 48 mg/dL (ref 0.0–149.0)

## 2012-01-03 ENCOUNTER — Encounter: Payer: BC Managed Care – PPO | Admitting: Internal Medicine

## 2012-01-06 ENCOUNTER — Encounter: Payer: Self-pay | Admitting: Internal Medicine

## 2012-01-06 ENCOUNTER — Ambulatory Visit (INDEPENDENT_AMBULATORY_CARE_PROVIDER_SITE_OTHER): Payer: BC Managed Care – PPO | Admitting: Internal Medicine

## 2012-01-06 ENCOUNTER — Telehealth: Payer: Self-pay | Admitting: *Deleted

## 2012-01-06 VITALS — BP 128/82 | HR 72 | Temp 98.3°F | Ht 70.0 in | Wt 179.6 lb

## 2012-01-06 DIAGNOSIS — Z0000 Encounter for general adult medical examination without abnormal findings: Secondary | ICD-10-CM

## 2012-01-06 DIAGNOSIS — Z23 Encounter for immunization: Secondary | ICD-10-CM

## 2012-01-06 DIAGNOSIS — E039 Hypothyroidism, unspecified: Secondary | ICD-10-CM | POA: Insufficient documentation

## 2012-01-06 MED ORDER — LEVOTHYROXINE SODIUM 50 MCG PO TABS: 50.0000 ug | ORAL_TABLET | Freq: Every day | ORAL | Status: DC

## 2012-01-06 NOTE — Progress Notes (Signed)
Subjective:    Patient ID: Jacob [name], male    DOB: 1980/06/18, 31 y.o.   MRN: [id number]  HPI  patient is here today for annual physical. Patient feels well and has no complaints.  Reviewed chronic medical issues: Colonization with community acquired MRSA. Recurrent skin infections 3-4 times annually due to same - treated with Septra as needed and has been evaluated by ID for same. Not compliance with nasal treatment monthly as recommended by ID, uses Hibiclens as needed. No current skin problem  Depression. Overlaps with history of alcohol and substance abuse. Sober since January 2009. On Cymbalta since that time and works with substance counselor in [place], [place]. Denies active symptoms - plans to wean off medication at some point but will continue to work with counselor on same   Past Medical History  Diagnosis Date  . H/O alcohol abuse     sober since 02/04/2007  . Depression   . GERD (gastroesophageal reflux disease)   . Genital warts   . 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 Heart disease Father   . Hyperlipidemia Father   . Hypertension Father   . Diabetes Other   . Stroke Other   . Heart disease Other    History  Substance Use Topics  . Smoking status: Former Smoker    Types: Cigarettes    Quit date: 03/28/2008  . Smokeless tobacco: Never Used  . Alcohol Use: No    Review of Systems  Constitutional: Negative for fever or weight change.  Respiratory: Negative for cough and shortness of breath.   Cardiovascular: Negative for chest pain or palpitations.  Gastrointestinal: Negative for abdominal pain, no bowel changes.  Musculoskeletal: Negative for gait problem or joint swelling.  Skin: Negative for rash.  Neurological: Negative for dizziness or headache.  No other specific complaints in a complete review of systems (except as listed in  HPI above).     Objective:   Physical Exam  BP 128/82  Pulse 72  Temp 98.3 F (36.8 C) (Oral)  Ht 5\' 10"  (1.778 m)  Wt 179 lb 9.6 oz (81.466 kg)  BMI 25.77 kg/m2  SpO2 96% Wt Readings from Last 3 Encounters:  01/06/12 179 lb 9.6 oz (81.466 kg)  10/17/11 175 lb 12 oz (79.72 kg)  01/02/11 187 lb 6.4 oz (85.004 kg)   Constitutional:  He appears well-developed and well-nourished. No distress.  HENT: Normocephalic/atraumatic, sinus nontender. Ears: Tympanic membranes clear without effusion or erythema. Hearing grossly intact. Oropharynx clear without erythema or exudate Eyes: PERRL, EOMI - vision intact Neck: Normal range of motion. Neck supple. No JVD or LAD present. No thyromegaly present.  Cardiovascular: Normal rate, regular rhythm and normal heart sounds.  No murmur heard. no BLE edema Pulmonary/Chest: Effort normal and breath sounds normal. No respiratory distress. no wheezes.  Abdominal: Soft. Bowel sounds are normal. Patient exhibits no distension. There is no tenderness.  Musculoskeletal: Normal range of motion. Patient exhibits no edema.  Neurological: he is alert and oriented to person, place, and time. No cranial nerve deficit. Coordination normal.  Skin: small scattered keloids on anterior chest - no purulence or cellulitis - remaining skin is warm and dry. Facial acne improved. No erythema or ulceration.  Psychiatric: he has an expansive normal mood and affect. behavior is normal. Judgment and thought content normal.   Lab Results  Component Value Date   WBC 5.8 01/01/2012   HGB 15.9 01/01/2012   HCT 46.4 01/01/2012   PLT 232.0 01/01/2012   GLUCOSE 88 01/01/2012   CHOL 194 01/01/2012   TRIG 48.0 01/01/2012   HDL 70.70 01/01/2012   LDLCALC 114* 01/01/2012   ALT 33 01/01/2012   AST 36 01/01/2012   NA 139 01/01/2012   K 3.9 01/01/2012   CL 102 01/01/2012   CREATININE 1.1 01/01/2012   BUN 27* 01/01/2012   CO2 28 01/01/2012   TSH 7.08* 01/01/2012        Assessment & Plan:  CPX - v70.0 -Patient has been counseled on age-appropriate routine health concerns for screening and prevention. These are reviewed and up-to-date. Immunizations are up-to-date or declined. Labs  reviewed.  Abnormal TSH -hx same 12/2010 - previously used multiple herbal and energy supplements, but none >6 months. No overt symptoms, start synthroid and will recheck TSH in 3 months to monitor

## 2012-01-06 NOTE — Assessment & Plan Note (Signed)
Start synthroid 50 mcg and recheck TSH in 3 months Lab Results  Component Value Date   TSH 7.08* 01/01/2012

## 2012-01-06 NOTE — Telephone Encounter (Signed)
Pt made cpx for Dec 2014 putting in cpx labs...lmb

## 2012-01-06 NOTE — Telephone Encounter (Signed)
Message copied by Deatra James on Mon Jan 06, 2012  4:07 PM ------      Message from: Etheleen Sia      Created: Mon Jan 06, 2012  3:04 PM      Regarding: LABS       PHYSICAL LABS IN DEC 2014

## 2012-01-06 NOTE — Patient Instructions (Addendum)
It was good to see you today. Health Maintenance reviewed - flu shot today -all other recommended immunizations and age-appropriate screenings are up-to-date. We have reviewed your prior records including labs and tests today Start low dose thyroid replacement everyday - Your prescription(s) have been submitted to your pharmacy. Please take as directed and contact our office if you believe you are having problem(s) with the medication(s). Test(s) ordered today to recheck thyroid labs in 3 months. Your results will be released to MyChart (or called to you) after review, usually within 72hours after test completion. If any changes need to be made, you will be notified at that same time. Health Maintenance, Males A healthy lifestyle and preventative care can promote health and wellness.  Maintain regular health, dental, and eye exams.   Eat a healthy diet. Foods like vegetables, fruits, whole grains, low-fat dairy products, and lean protein foods contain the nutrients you need without too many calories. Decrease your intake of foods high in solid fats, added sugars, and salt. Get information about a proper diet from your caregiver, if necessary.   Regular physical exercise is one of the most important things you can do for your health. Most adults should get at least 150 minutes of moderate-intensity exercise (any activity that increases your heart rate and causes you to sweat) each week. In addition, most adults need muscle-strengthening exercises on 2 or more days a week.     Maintain a healthy weight. The body mass index (BMI) is a screening tool to identify possible weight problems. It provides an estimate of body fat based on height and weight. Your caregiver can help determine your BMI, and can help you achieve or maintain a healthy weight. For adults 20 years and older:   A BMI below 18.5 is considered underweight.   A BMI of 18.5 to 24.9 is normal.   A BMI of 25 to 29.9 is considered  overweight.   A BMI of 30 and above is considered obese.   Maintain normal blood lipids and cholesterol by exercising and minimizing your intake of saturated fat. Eat a balanced diet with plenty of fruits and vegetables. Blood tests for lipids and cholesterol should begin at age 68 and be repeated every 5 years. If your lipid or cholesterol levels are high, you are over 50, or you are a high risk for heart disease, you may need your cholesterol levels checked more frequently. Ongoing high lipid and cholesterol levels should be treated with medicines, if diet and exercise are not effective.   If you smoke, find out from your caregiver how to quit. If you do not use tobacco, do not start.   If you choose to drink alcohol, do not exceed 2 drinks per day. One drink is considered to be 12 ounces (355 mL) of beer, 5 ounces (148 mL) of wine, or 1.5 ounces (44 mL) of liquor.   Avoid use of street drugs. Do not share needles with anyone. Ask for help if you need support or instructions about stopping the use of drugs.   High blood pressure causes heart disease and increases the risk of stroke. Blood pressure should be checked at least every 1 to 2 years. Ongoing high blood pressure should be treated with medicines if weight loss and exercise are not effective.   If you are 36 to 31 years old, ask your caregiver if you should take aspirin to prevent heart disease.   Diabetes screening involves taking a blood sample to check your  fasting blood sugar level. This should be done once every 3 years, after age 60, if you are within normal weight and without risk factors for diabetes. Testing should be considered at a younger age or be carried out more frequently if you are overweight and have at least 1 risk factor for diabetes.   Colorectal cancer can be detected and often prevented. Most routine colorectal cancer screening begins at the age of 38 and continues through age 46. However, your caregiver may  recommend screening at an earlier age if you have risk factors for colon cancer. On a yearly basis, your caregiver may provide home test kits to check for hidden blood in the stool. Use of a small camera at the end of a tube, to directly examine the colon (sigmoidoscopy or colonoscopy), can detect the earliest forms of colorectal cancer. Talk to your caregiver about this at age 31, when routine screening begins. Direct examination of the colon should be repeated every 5 to 10 years through age 36, unless early forms of pre-cancerous polyps or small growths are found.   Hepatitis C blood testing is recommended for all people born from [birth year] through 1965 and any individual with known risks for hepatitis C.   Healthy men should no longer receive prostate-specific antigen (PSA) blood tests as part of routine cancer screening. Consult with your caregiver about prostate cancer screening.   Testicular cancer screening is not recommended for adolescents or adult males who have no symptoms. Screening includes self-exam, caregiver exam, and other screening tests. Consult with your caregiver about any symptoms you have or any concerns you have about testicular cancer.   Practice safe sex. Use condoms and avoid high-risk sexual practices to reduce the spread of sexually transmitted infections (STIs).   Use sunscreen with a sun protection factor (SPF) of 30 or greater. Apply sunscreen liberally and repeatedly throughout the day. You should seek shade when your shadow is shorter than you. Protect yourself by wearing long sleeves, pants, a wide-brimmed hat, and sunglasses year round, whenever you are outdoors.   Notify your caregiver of new moles or changes in moles, especially if there is a change in shape or color. Also notify your caregiver if a mole is larger than the size of a pencil eraser.   A one-time screening for abdominal aortic aneurysm (AAA) and surgical repair of large AAAs by sound wave imaging  (ultrasonography) is recommended for ages 25 to 38 years who are current or former smokers.   Stay current with your immunizations.  Document Released: 07/06/2007 Document Revised: 04/01/2011 Document Reviewed: 06/04/2010 [facility] Patient Information 2013 [place], [place].

## 2012-01-27 ENCOUNTER — Telehealth: Payer: Self-pay | Admitting: *Deleted

## 2012-01-27 NOTE — Telephone Encounter (Signed)
Left msg on vm stating needing a referral to see neurologist. Called pt back he states he had a accident currently in [place], but needing a referral to see neurologist. Inform pt he would have to see md first before referral. Pt states when he get back tomorrow if still need to see neurologist will call back to make appt...Raechel Chute

## 2012-02-03 ENCOUNTER — Telehealth: Payer: Self-pay | Admitting: *Deleted

## 2012-02-03 DIAGNOSIS — E039 Hypothyroidism, unspecified: Secondary | ICD-10-CM

## 2012-02-03 NOTE — Telephone Encounter (Signed)
tsh recheck ordered - lab only - will call with results after review

## 2012-02-03 NOTE — Telephone Encounter (Signed)
Pt states he ? If the mcg on the  levothyroxine is too much for him. He states he feel like he is on speed. Want to have TSH check to see if mcg is too much. He states he been taking med for about 4 weeks now,,,,/lmb

## 2012-02-04 ENCOUNTER — Other Ambulatory Visit (INDEPENDENT_AMBULATORY_CARE_PROVIDER_SITE_OTHER): Payer: BC Managed Care – PPO

## 2012-02-04 DIAGNOSIS — E039 Hypothyroidism, unspecified: Secondary | ICD-10-CM

## 2012-02-04 LAB — TSH: TSH: 2.52 u[IU]/mL (ref 0.35–5.50)

## 2012-02-04 NOTE — Telephone Encounter (Signed)
Notified pt with md response.../lmb 

## 2012-02-19 ENCOUNTER — Ambulatory Visit: Payer: BC Managed Care – PPO | Admitting: Internal Medicine

## 2012-03-16 ENCOUNTER — Ambulatory Visit (INDEPENDENT_AMBULATORY_CARE_PROVIDER_SITE_OTHER): Payer: BC Managed Care – PPO | Admitting: Internal Medicine

## 2012-03-16 ENCOUNTER — Encounter: Payer: Self-pay | Admitting: Internal Medicine

## 2012-03-16 VITALS — BP 112/72 | HR 65 | Temp 97.1°F | Wt 176.1 lb

## 2012-03-16 DIAGNOSIS — M940 Chondrocostal junction syndrome [Tietze]: Secondary | ICD-10-CM

## 2012-03-16 DIAGNOSIS — L709 Acne, unspecified: Secondary | ICD-10-CM

## 2012-03-16 DIAGNOSIS — T3991XA Poisoning by unspecified nonopioid analgesic, antipyretic and antirheumatic, accidental (unintentional), initial encounter: Secondary | ICD-10-CM

## 2012-03-16 DIAGNOSIS — L708 Other acne: Secondary | ICD-10-CM

## 2012-03-16 MED ORDER — MELOXICAM 15 MG PO TABS: 15.0000 mg | ORAL_TABLET | Freq: Every day | ORAL | Status: DC

## 2012-03-16 NOTE — Patient Instructions (Signed)
It was good to see you today. There is no evidence for stomach ulcer according to your history or exam today Continue using Prilosec for 2-4 weeks as needed for stomach indigestion as discussed, call if unimproved after 30 days of treatment Use meloxicam daily as needed for anti-inflammatory in place of high-dose ibuprofen -Your prescription(s) have been submitted to your pharmacy. Please take as directed and contact our office if you believe you are having problem(s) with the medication(s). Try weaning off acne antibiotics as discussed, every other day if tolerated Followup for thyroid check this summer, call sooner if problems

## 2012-03-16 NOTE — Progress Notes (Signed)
Subjective:    Patient ID: Jacob [name], male    DOB: 09/22/1980, 32 y.o.   MRN: [id number]  Gastrophageal Reflux He complains of chest pain (reproducible to palpation of L side) and heartburn. He reports no coughing, no dysphagia, no early satiety, no globus sensation, no sore throat, no tooth decay, no water brash or no wheezing. This is a new problem. The current episode started today. The problem occurs frequently. The problem has been unchanged. The heartburn is located in the substernum. The heartburn is of mild intensity. Exacerbated by: NSAIDs. Pertinent negatives include no anemia, melena, orthopnea or weight loss. Risk factors include NSAIDs. He has tried an antacid and a PPI for the symptoms. The treatment provided significant relief. Past procedures include an EGD (remote, while drinking).   Past Medical History  Diagnosis Date  . H/O alcohol abuse     sober since 02/04/2007  . Depression   . GERD (gastroesophageal reflux disease)   . Genital warts   . 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  HENT: Negative for sore throat.   Respiratory: Negative for cough and wheezing.   Cardiovascular: Positive for chest pain (reproducible to palpation of L side).  Gastrointestinal: Positive for heartburn. Negative for dysphagia and melena.       Objective:   Physical Exam BP 112/72  Pulse 65  Temp(Src) 97.1 F (36.2 C) (Oral)  Wt 176 lb 1.9 oz (79.888 kg)  BMI 25.27 kg/m2  SpO2 99% Wt Readings from Last 3 Encounters:  03/16/12 176 lb 1.9 oz (79.888 kg)  01/06/12 179 lb 9.6 oz (81.466 kg)  10/17/11 175 lb 12 oz (79.72 kg)   Constitutional:  He appears well-developed and well-nourished. No distress.  Neck: Normal range of motion. Neck supple. No JVD present. No thyromegaly present.  Cardiovascular: Normal rate, regular rhythm and normal heart sounds.  No  murmur heard. no BLE edema Pulmonary/Chest: Effort normal and breath sounds normal. No respiratory distress. no wheezes.  Abdominal: Soft. Bowel sounds are normal. Patient exhibits no distension. There is no tenderness.  Musculoskeletal: Normal range of motion. Patient exhibits no edema.  Neurological: he is alert and oriented to person, place, and time. No cranial nerve deficit. Coordination normal.  Skin: Skin is warm and dry.  No erythema or ulceration.  Psychiatric: he has a normal mood and affect. behavior is normal. Judgment and thought content normal.   Lab Results  Component Value Date   WBC 5.8 01/01/2012   HGB 15.9 01/01/2012   HCT 46.4 01/01/2012   PLT 232.0 01/01/2012   GLUCOSE 88 01/01/2012   CHOL 194 01/01/2012   TRIG 48.0 01/01/2012   HDL 70.70 01/01/2012   LDLCALC 114* 01/01/2012   ALT 33 01/01/2012   AST 36 01/01/2012   NA 139 01/01/2012   K 3.9 01/01/2012   CL 102 01/01/2012   CREATININE 1.1 01/01/2012   BUN 27* 01/01/2012   CO2 28 01/01/2012   TSH 2.52 02/04/2012       Assessment & Plan:   Costochondritis, improved with self administered high-dose anti-inflammatories. Reassurance and review of diagnosis today. Patient to call if symptoms worse or unimproved. Change anti-inflammatory to meloxicam because of GI upset, see next  GERD -suspect NSAIDs-induced gastritis. Improved with discontinuation of high-dose ibuprofen and self administered to be PPI trial. Education on diagnosis provided. Change anti-inflammatory as above to  meloxicam. Knees PPI for 2 weeks as needed, patient call symptoms unimproved and 30 day -no red flags on history or exam today  Acne. Has improved with antibiotic therapy. Encouraged to wean down as tolerated daily, then every other day as needed for control of skin flare

## 2012-03-24 ENCOUNTER — Other Ambulatory Visit: Payer: Self-pay | Admitting: Internal Medicine

## 2012-03-24 ENCOUNTER — Telehealth: Payer: Self-pay | Admitting: Internal Medicine

## 2012-03-24 ENCOUNTER — Encounter: Payer: Self-pay | Admitting: Internal Medicine

## 2012-03-24 DIAGNOSIS — K2970 Gastritis, unspecified, without bleeding: Secondary | ICD-10-CM

## 2012-03-24 DIAGNOSIS — K2990 Gastroduodenitis, unspecified, without bleeding: Secondary | ICD-10-CM

## 2012-03-24 MED ORDER — ESOMEPRAZOLE MAGNESIUM 40 MG PO PACK: 40.0000 mg | PACK | Freq: Every day | ORAL | Status: DC

## 2012-03-24 NOTE — Telephone Encounter (Signed)
Notified pt with regina response.../lmb 

## 2012-03-24 NOTE — Telephone Encounter (Signed)
The patient called hoping to get a strong proton pump inhibitor medication called in.  His callback number is [phone] , thanks

## 2012-03-24 NOTE — Telephone Encounter (Signed)
Lucy, eRx for Nexium 40 mg daily done [facility]

## 2012-04-01 ENCOUNTER — Encounter: Payer: Self-pay | Admitting: Internal Medicine

## 2012-04-02 ENCOUNTER — Telehealth: Payer: Self-pay | Admitting: Gastroenterology

## 2012-04-02 ENCOUNTER — Encounter: Payer: Self-pay | Admitting: Internal Medicine

## 2012-04-02 NOTE — Telephone Encounter (Signed)
Old pt of Dr Jarold Motto reports 2 weeks of upper abdominal pain. He is on Omeprazole BID with no help. He is also distended and bloating. He eats all the right foods, has been sober for> 5years after going thru rehab. He does go to [place] twice a year for work. Pt given an appt with Mike Gip, PA. Pt stated understanding.

## 2012-04-03 ENCOUNTER — Encounter: Payer: Self-pay | Admitting: Gastroenterology

## 2012-04-03 ENCOUNTER — Encounter: Payer: Self-pay | Admitting: Physician Assistant

## 2012-04-03 ENCOUNTER — Ambulatory Visit (INDEPENDENT_AMBULATORY_CARE_PROVIDER_SITE_OTHER): Payer: BC Managed Care – PPO | Admitting: Physician Assistant

## 2012-04-03 VITALS — BP 118/60 | HR 76 | Ht 70.0 in | Wt 179.5 lb

## 2012-04-03 DIAGNOSIS — R1013 Epigastric pain: Secondary | ICD-10-CM

## 2012-04-03 DIAGNOSIS — R110 Nausea: Secondary | ICD-10-CM

## 2012-04-03 MED ORDER — ESOMEPRAZOLE MAGNESIUM 40 MG PO CPDR: 40.0000 mg | DELAYED_RELEASE_CAPSULE | Freq: Every day | ORAL | Status: DC

## 2012-04-03 NOTE — Patient Instructions (Addendum)
Take the Nexium samples, 1 cap twice daily .  You have been scheduled for an endoscopy with propofol. Please follow written instructions given to you at your visit today. If you use inhalers (even only as needed), please bring them with you on the day of your procedure.  Stay on the probiotic.

## 2012-04-04 ENCOUNTER — Encounter: Payer: Self-pay | Admitting: Physician Assistant

## 2012-04-04 NOTE — Progress Notes (Signed)
Subjective:    Patient ID: Jacob [name], male    DOB: Jan 21, 1981, 32 y.o.   MRN: [id number]  HPI [name] he is a pleasant 32 year old white male known previously to Jacob [name] who had undergone endoscopy in 2008 at which time he was having reflux symptoms and epigastric pain. Found to have a hiatal hernia and biopsies showed chronic gastritis-no Barrett's. He comes in today complaining of upper abdominal pain over the past 18 days. He had an episode of costochondritis in January and had taken high-dose ibuprofen continuously for about 3 weeks. He was seen by Jacob [name] on 03/12/2012 and was started on a PPI x2 weeks. At that time he says the costochondritis was better but he was having some epigastric discomfort. She also gave him a prescription for meloxicam but he says he is only taking this a couple of times. He mentions that he has been sober of the past 5 years and is trying to stay away from taking a lot of medications. He admits to having a lot of anxiety over his health. Says she's had noted some changes in his bowel habits with occasional constipation and occasional diarrhea. He denies any heartburn or indigestion symptoms no dysphagia or dying aphasia point he has a constant throbbing epigastric discomfort which he says is "uncomfortable". He is also had some nausea but no vomiting. He says says sometimes his pain feels better with food in the stomach. Has not noted any melena or hematochezia .    Review of Systems  Constitutional: Negative.   Eyes: Negative.   Cardiovascular: Negative.   Gastrointestinal: Positive for nausea and abdominal pain.  Endocrine: Negative.   Genitourinary: Negative.   Skin: Negative.   Allergic/Immunologic: Negative.   Neurological: Negative.   Hematological: Negative.   Psychiatric/Behavioral: The patient is nervous/anxious.    Outpatient Prescriptions Prior to Visit  Medication Sig Dispense Refill  . cetirizine (ZYRTEC) 10 MG tablet Take 10  mg by mouth daily.        . DULoxetine (CYMBALTA) 30 MG capsule Take 1 capsule (30 mg total) by mouth daily.      Marland Kitchen levothyroxine (SYNTHROID, LEVOTHROID) 50 MCG tablet Take 1 tablet (50 mcg total) by mouth daily.  90 tablet  3  . Multiple Vitamin (MULTIVITAMIN) tablet Take 1 tablet by mouth daily.        . nicotine polacrilex (COMMIT) 2 MG lozenge Place 2 mg inside cheek daily.        Marland Kitchen sulfamethoxazole-trimethoprim (BACTRIM DS) 800-160 MG per tablet Take 1 tablet by mouth daily.       Marland Kitchen esomeprazole (NEXIUM) 40 MG packet Take 40 mg by mouth daily before breakfast.  30 each  12  . meloxicam (MOBIC) 15 MG tablet Take 1 tablet (15 mg total) by mouth daily.  30 tablet  3  . tretinoin (RETIN-A) 0.05 % cream Apply topically at bedtime.  45 g  0   No facility-administered medications prior to visit.   No Known Allergies Patient Active Problem List  Diagnosis  . Depression  . GERD (gastroesophageal reflux disease)  . Community acquired MRSA infection  . Acne  . Hypothyroid   History  Substance Use Topics  . Smoking status: Former Smoker    Types: Cigarettes    Quit date: 03/28/2008  . Smokeless tobacco: Never Used  . Alcohol Use: No   family history includes Diabetes in his other; Heart disease in his father and other; Hyperlipidemia in his father; Hypertension in his father;  and Stroke in his other.     Objective:   Physical Exam well-developed healthy-appearing 32 year old white male in no acute distress, pleasant blood pressure 118/80 pulse 76 height 5 foot 10 weight 179. HEENT; nontraumatic normocephalic EOMI PERRLA sclera anicteric,;Neck; Supple no JVD, Cardiovascular regular rate and rhythm with S1-S2 no murmur or gallop, Pulmonary; clear bilaterally, Abdomen; soft and is mildly tender in the epigastrium is no guarding or rebound no palpable mass or hepatosplenomegaly bowel sounds are active, done, Extremities; no clubbing cyanosis or edema skin warm dry, Psych; mood and affect normal  and appropriate        Assessment & Plan:  #53 32 year old male with 3 week history of epigastric pain and vague nausea . Suspect he may have an NSAID-induced gastropathy or ulcer. #2 altered bowel habits-most consistent with IBS #3 anxiety/depression #4 history of substance abuse/inactive  Plan; will give him a short course of Nexium 40 mg by mouth twice daily x2 weeks We discussed probiotics and he states he is already taking one from a health food store and we'll continue this. Schedule for upper endoscopy with Jacob [name] was discussed in detail with the patient and he is agreeable to proceed

## 2012-04-06 ENCOUNTER — Encounter: Payer: Self-pay | Admitting: Gastroenterology

## 2012-04-06 ENCOUNTER — Ambulatory Visit (AMBULATORY_SURGERY_CENTER): Payer: BC Managed Care – PPO | Admitting: Gastroenterology

## 2012-04-06 VITALS — BP 113/63 | HR 49 | Temp 97.3°F | Resp 19 | Ht 70.0 in | Wt 179.0 lb

## 2012-04-06 DIAGNOSIS — R110 Nausea: Secondary | ICD-10-CM

## 2012-04-06 DIAGNOSIS — K219 Gastro-esophageal reflux disease without esophagitis: Secondary | ICD-10-CM

## 2012-04-06 DIAGNOSIS — D131 Benign neoplasm of stomach: Secondary | ICD-10-CM

## 2012-04-06 DIAGNOSIS — R1013 Epigastric pain: Secondary | ICD-10-CM

## 2012-04-06 MED ORDER — SODIUM CHLORIDE 0.9 % IV SOLN: 500.0000 mL | INTRAVENOUS | Status: DC

## 2012-04-06 NOTE — Patient Instructions (Signed)
Start Nexium 40 mg. Once daily 20 minutes before your morning meal.  Dr. Norval Gable office will call you with a appointment for an Abdominal Ultrasound.  YOU HAD AN ENDOSCOPIC PROCEDURE TODAY AT THE [facility]: Refer to the procedure report that was given to you for any specific questions about what was found during the examination.  If the procedure report does not answer your questions, please call your gastroenterologist to clarify.  If you requested that your care partner not be given the details of your procedure findings, then the procedure report has been included in a sealed envelope for you to review at your convenience later.  YOU SHOULD EXPECT: Some feelings of bloating in the abdomen. Passage of more gas than usual.  Walking can help get rid of the air that was put into your GI tract during the procedure and reduce the bloating. If you had a lower endoscopy (such as a colonoscopy or flexible sigmoidoscopy) you may notice spotting of blood in your stool or on the toilet paper. If you underwent a bowel prep for your procedure, then you may not have a normal bowel movement for a few days.  DIET: Your first meal following the procedure should be a light meal and then it is ok to progress to your normal diet.  A half-sandwich or bowl of soup is an example of a good first meal.  Heavy or fried foods are harder to digest and may make you feel nauseous or bloated.  Likewise meals heavy in dairy and vegetables can cause extra gas to form and this can also increase the bloating.  Drink plenty of fluids but you should avoid alcoholic beverages for 24 hours.  ACTIVITY: Your care partner should take you home directly after the procedure.  You should plan to take it easy, moving slowly for the rest of the day.  You can resume normal activity the day after the procedure however you should NOT DRIVE or use heavy machinery for 24 hours (because of the sedation medicines used during the test).     SYMPTOMS TO REPORT IMMEDIATELY: A gastroenterologist can be reached at any hour.  During normal business hours, 8:30 AM to 5:00 PM Monday through Friday, call [phone].  After hours and on weekends, please call the GI answering service at [phone] who will take a message and have the physician on call contact you.  Following upper endoscopy (EGD)  Vomiting of blood or coffee ground material  New chest pain or pain under the shoulder blades  Painful or persistently difficult swallowing  New shortness of breath  Fever of 100F or higher  Black, tarry-looking stools  FOLLOW UP: If any biopsies were taken you will be contacted by phone or by letter within the next 1-3 weeks.  Call your gastroenterologist if you have not heard about the biopsies in 3 weeks.  Our staff will call the home number listed on your records the next business day following your procedure to check on you and address any questions or concerns that you may have at that time regarding the information given to you following your procedure. This is a courtesy call and so if there is no answer at the home number and we have not heard from you through the emergency physician on call, we will assume that you have returned to your regular daily activities without incident.  SIGNATURES/CONFIDENTIALITY: You and/or your care partner have signed paperwork which will be entered into your electronic medical record.  These signatures attest to the fact that that the information above on your After Visit Summary has been reviewed and is understood.  Full responsibility of the confidentiality of this discharge information lies with you and/or your care-partner.

## 2012-04-06 NOTE — Progress Notes (Signed)
Called to room to assist during endoscopic procedure.  Patient ID and intended procedure confirmed with present staff. Received instructions for my participation in the procedure from the performing physician.  

## 2012-04-06 NOTE — Op Note (Signed)
[facility] [address] [place], [address]   ENDOSCOPY PROCEDURE REPORT  PATIENT: Jacob [name], Jacob [name].  MR#: [id number] BIRTHDATE: 03-04-1980 , 31  yrs. old GENDER: Male ENDOSCOPIST:Laneta Guerin Hale Bogus, MD, Clementeen Graham REFERRED BY: Mike Gip, PA-C PROCEDURE DATE:  04/06/2012 PROCEDURE:   EGD w/ biopsy and EGD w/ biopsy for H.pylori ASA CLASS:    Class II INDICATIONS: Epigastric pain. MEDICATION: propofol (Diprivan) 400mg  IV TOPICAL ANESTHETIC:   Cetacaine Spray  DESCRIPTION OF PROCEDURE:   After the risks and benefits of the procedure were explained, informed consent was obtained.  The LB GIF-H180 [id number]  endoscope was introduced through the mouth  and advanced to the second portion of the duodenum .  The instrument was slowly withdrawn as the mucosa was fully examined.      DUODENUM: The duodenal mucosa showed no abnormalities in the bulb and second portion of the duodenum.  STOMACH: The mucosa of the stomach appeared normal.  Multiple biopsies were performed.  CLOBx. also done.  ESOPHAGUS: There was LA Class A esophagitis noted.   A 2 cm hiatal hernia was noted. Biopsies of irregular Z-line done.   Retroflexed views revealed no abnormalities.    The scope was then withdrawn from the patient and the procedure completed.  COMPLICATIONS: There were no complications.   ENDOSCOPIC IMPRESSION: 1.   The duodenal mucosa showed no abnormalities in the bulb and second portion of the duodenum ,no ulcer noted. 2.   The mucosa of the stomach appeared normal; multiple biopsies .Marland KitchenMarland KitchenR/O H.pylori infection. 3.   There was LA Class A esophagitis noted c/w GERD,R/O Barrett's mucosa. 4.   2 cm hiatal hernia noted  RECOMMENDATIONS: 1.  Await pathology results 2.  Continue PPI 3.  Rx CLO if positive 4.  My office will arrange for you to have an abdominal ultrasound performed.    _______________________________ eSigned:  Mardella Layman, MD, [facility] 04/06/2012  3:25 PM   standard discharge   PATIENT NAME:  Jacob [name]. MR#: [id number]

## 2012-04-06 NOTE — Progress Notes (Signed)
Patient did not experience any of the following events: a burn prior to discharge; a fall within the facility; wrong site/side/patient/procedure/implant event; or a hospital transfer or hospital admission upon discharge from the facility. (G8907) Patient did not have preoperative order for IV antibiotic SSI prophylaxis. (G8918)  

## 2012-04-07 ENCOUNTER — Telehealth: Payer: Self-pay | Admitting: *Deleted

## 2012-04-07 DIAGNOSIS — R1013 Epigastric pain: Secondary | ICD-10-CM

## 2012-04-07 NOTE — Telephone Encounter (Signed)
  Follow up Call-  Call back number 04/06/2012  Post procedure Call Back phone  # [phone]  Permission to leave phone message Yes     Patient questions:  Do you have a fever, pain , or abdominal swelling? no Pain Score  0 *  Have you tolerated food without any problems? yes  Have you been able to return to your normal activities? yes  Do you have any questions about your discharge instructions: Diet   no Medications  no Follow up visit  no  Do you have questions or concerns about your Care? no  Actions: * If pain score is 4 or above: No action needed, pain <4.

## 2012-04-07 NOTE — Telephone Encounter (Signed)
Informed pt of U/S appt on 04/10/12 at [facility]; arrive at 0745am, NPO after midnight. Pt stated understanding.

## 2012-04-09 ENCOUNTER — Encounter: Payer: Self-pay | Admitting: Internal Medicine

## 2012-04-10 ENCOUNTER — Ambulatory Visit (HOSPITAL_COMMUNITY)
Admission: RE | Admit: 2012-04-10 | Discharge: 2012-04-10 | Disposition: A | Discharge status: Home / Self Care | Payer: BC Managed Care – PPO | Source: Ambulatory Visit | Attending: Gastroenterology | Admitting: Gastroenterology

## 2012-04-10 ENCOUNTER — Telehealth: Payer: Self-pay | Admitting: *Deleted

## 2012-04-10 ENCOUNTER — Encounter: Payer: Self-pay | Admitting: Gastroenterology

## 2012-04-10 DIAGNOSIS — K219 Gastro-esophageal reflux disease without esophagitis: Secondary | ICD-10-CM | POA: Insufficient documentation

## 2012-04-10 DIAGNOSIS — R1013 Epigastric pain: Secondary | ICD-10-CM

## 2012-04-10 MED ORDER — ESOMEPRAZOLE MAGNESIUM 40 MG PO CPDR: 40.0000 mg | DELAYED_RELEASE_CAPSULE | Freq: Two times a day (BID) | ORAL | Status: DC

## 2012-04-10 NOTE — Telephone Encounter (Signed)
Informed pt of U/S results and he wanted to know if he should continue BID Nexium and should he keep next week's appt. Informed pt per Dr Jarold Motto, continue BID Nexium and change f/u to a couple of weeks, 04/24/12. Pt stated understanding.

## 2012-04-14 ENCOUNTER — Ambulatory Visit: Payer: BC Managed Care – PPO | Admitting: Gastroenterology

## 2012-04-17 ENCOUNTER — Encounter: Payer: Self-pay | Admitting: *Deleted

## 2012-04-24 ENCOUNTER — Encounter: Payer: Self-pay | Admitting: Gastroenterology

## 2012-04-24 ENCOUNTER — Ambulatory Visit (INDEPENDENT_AMBULATORY_CARE_PROVIDER_SITE_OTHER): Payer: BC Managed Care – PPO | Admitting: Gastroenterology

## 2012-04-24 ENCOUNTER — Encounter: Payer: Self-pay | Admitting: Internal Medicine

## 2012-04-24 VITALS — BP 114/68 | HR 57 | Ht 70.0 in | Wt 177.5 lb

## 2012-04-24 DIAGNOSIS — K219 Gastro-esophageal reflux disease without esophagitis: Secondary | ICD-10-CM

## 2012-04-24 DIAGNOSIS — F17290 Nicotine dependence, other tobacco product, uncomplicated: Secondary | ICD-10-CM

## 2012-04-24 DIAGNOSIS — F17200 Nicotine dependence, unspecified, uncomplicated: Secondary | ICD-10-CM

## 2012-04-24 DIAGNOSIS — R079 Chest pain, unspecified: Secondary | ICD-10-CM

## 2012-04-24 MED ORDER — VARENICLINE TARTRATE 0.5 MG PO TABS: 0.5000 mg | ORAL_TABLET | Freq: Two times a day (BID) | ORAL | Status: DC

## 2012-04-24 MED ORDER — ESOMEPRAZOLE MAGNESIUM 40 MG PO CPDR: 40.0000 mg | DELAYED_RELEASE_CAPSULE | Freq: Two times a day (BID) | ORAL | Status: DC

## 2012-04-24 NOTE — Patient Instructions (Signed)
Chantix was sent to your pharmacy and a new prescription for Nexium.  Please follow up with Dr. Jarold Motto in three months.  ___________________________________________________________________________________________________________________________________  Smoking and Your Digestive System Cigarette smoking causes many life-threatening diseases. These include lung cancer, other cancers, emphysema, and heart disease. About 430,000 deaths each year are directly caused by cigarette smoking. Smoking results in disease-causing changes in all parts of the body. This includes the digestive system. This can cause serious effects, since the digestive system converts foods into nutrients the body needs to live. Smoking has been shown to have harmful effects on all parts of the digestive system. It adds to common disorders, such as heartburn and peptic ulcers. It also increases the risk of Crohn's disease, and possibly gallstones. Smoking seems to affect the liver by changing the way it handles drugs and alcohol and removes them. In fact, there seems to be enough evidence to stop smoking based solely on digestive distress. Some of the harmful effects of smoking are:  Heartburn (acid reflux).  Heartburn happens when acidic juices from the stomach splash into the esophagus, which has a more sensitive and less acid-resistant lining than the stomach. Normally, a muscular valve at the lower end of the esophagus keeps out the acid solution in the stomach. Smoking decreases the strength of the esophageal valve and its ability to keep out acidic stomach contents. This allows stomach acid reflux, or flow backward into the esophagus.  Smoking also seems to promote the movement of bile salts from the intestine to the stomach. This makes stomach acids more harmful.  A peptic ulcer is an open sore in the lining of the stomach or duodenum (first part of the small intestine). The exact cause of ulcers is not known. A link  between smoking cigarettes and ulcers, especially duodenal ulcers, does exist. Ulcers are more likely to occur, less likely to heal, and more likely to cause death in smokers than in nonsmokers.  Some research suggests that smoking might increase a person's risk of infection with the bacterium Helicobacter pylori (H. pylori). Most peptic ulcers are caused by this bacterium.  Stomach acid is also important in causing ulcers. Normally, most of this acid is buffered (neutralized) by the food we eat. Most of the unbuffered acid that enters the duodenum is quickly neutralized by sodium bicarbonate. This is a naturally occurring alkali, produced by the pancreas. Some studies show that smoking reduces the bicarbonate produced by the pancreas. This interferes with the neutralization of acid in the duodenum. Other studies suggest that chronic cigarette smoking may increase the amount of acid produced by the stomach.  Whatever causes the link between smoking and ulcers, two points have been repeatedly shown. People who smoke are more likely to develop an ulcer, especially a duodenal ulcer. Ulcers in smokers are less likely to heal quickly in response to otherwise effective treatment. This research strongly suggests that a person with an ulcer should stop smoking.  The liver is an important organ with many tasks. One task of the liver is to prepare drugs, alcohol, and other toxins for elimination (removal) from the body. There is evidence that smoking alters the ability of the liver to effectively handle such substances. In some cases, this may influence the dose of medicine needed to treat an illness. Some research suggests that smoking can aggravate and speed up the course of liver disease caused by excessive alcohol intake.  Studies have shown that smokers have weaker or less frequent stomach contractions while smoking, which  can cause less efficient digestion.  Crohn's disease causes inflammation deep in the  lining of the intestine. The disease causes pain and diarrhea. It usually affects the small intestine, but it can occur anywhere in the digestive tract. Research shows that current and former smokers have a higher risk of developing Crohn's disease than nonsmokers. Among people with the disease, smoking is linked with a higher rate of relapse, repeat surgery, and immunosuppressive treatment. In all areas, the risk for women who are current or former smokers is slightly higher than for men. Why smoking increases the risk of Crohn's disease is unknown.  Several studies suggest that smoking may increase the risk of developing gallstones. The risk may be higher for women. Research results on this topic are not consistent. More studies are needed.  Oral (lip and mouth) cancer and cancer of the pharynx (throat) and the esophagus are caused by smoking. Smoking may be associated with pancreatic cancer.  Some of the effects of smoking on the digestive system seem to be short-lived. For example, the effect of smoking on bicarbonate production by the pancreas does not appear to last. Half an hour after smoking, the production of bicarbonate returns to normal. The effects of smoking on how the liver handles drugs also disappear when a person stops smoking. However, people who no longer smoke still remain at risk for Crohn's disease. Document Released: 12/21/2003 Document Revised: 04/01/2011 Document Reviewed: 10/24/2008 ExitCare Patient Information 2013 ExitCare, [place]. _____________________________________________________________________________________________________________________________________________________________________________________                                               We are excited to introduce MyChart, a new best-in-class service that provides you online access to important information in your electronic medical record. We want to make it easier for you to view your health information -  all in one secure location - when and where you need it. We expect MyChart will enhance the quality of care and service we provide.  When you register for MyChart, you can:    View your test results.    Request appointments and receive appointment reminders via email.    Request medication renewals.    View your medical history, allergies, medications and immunizations.    Communicate with your physician's office through a password-protected site.    Conveniently print information such as your medication lists.  To find out if MyChart is right for you, please talk to a member of our clinical staff today. We will gladly answer your questions about this free health and wellness tool.  If you are age 55 or older and want a member of your family to have access to your record, you must provide written consent by completing a proxy form available at our office. Please speak to our clinical staff about guidelines regarding accounts for patients younger than age 38.  As you activate your MyChart account and need any technical assistance, please call the MyChart technical support line at [phone] 8132084185) or email your question to [email or web address]. If you email your question(s), please include your name, a return phone number and the best time to reach you.  If you have non-urgent health-related questions, you can send a message to our office through MyChart at [email or web address]. If you have a medical emergency, call 911.  Thank you for using MyChart as your new health and wellness resource!  MyChart licensed from [employer],  [id number]. Patents Pending.

## 2012-04-24 NOTE — Progress Notes (Signed)
History of Present Illness: This is a very pleasant and polite 32 year old Caucasian male who has chronic recurrent GERD.  This seems to be precipitated by use of large amounts of nicotine chewing gum.  Recent endoscopy showed erosive esophagitis confirmed by biopsies without evidence of Barrett's mucosa.  Upper abdominal ultrasound exam has been normal.  Treatment with Nexium 40 mg a day has completely alleviated his symptoms.  He denies dysphagia, lower GI, or hepatobiliary complaints. Is been off of alcohol now for 5 years, and is planning a marriage within the next year.    Current Medications, Allergies, Past Medical History, Past Surgical History, Family History and Social History were reviewed in [employer] record.  ROS: All systems were reviewed and are negative unless otherwise stated in the HPI.         Assessment and plan: Blood pressure 114/68, pulse 57 and regular and weight 177 with a BMI of 25.47.  General physical exam not performed.  This patient has chronic recurrent GERD exacerbated by use of large amounts of nicotine in his smoking cessation program.  I prescribed Chantix to stop smoking information, and we will try to taper off of the Nicoderm gum, continue Nexium, and see me again in 3 months time.  Standard antireflux maneuvers reviewed with patient, ultrasound reviewed also.

## 2012-06-09 ENCOUNTER — Encounter: Payer: Self-pay | Admitting: Internal Medicine

## 2012-06-09 DIAGNOSIS — E039 Hypothyroidism, unspecified: Secondary | ICD-10-CM

## 2012-06-10 ENCOUNTER — Encounter: Payer: Self-pay | Admitting: Internal Medicine

## 2012-06-16 ENCOUNTER — Other Ambulatory Visit (INDEPENDENT_AMBULATORY_CARE_PROVIDER_SITE_OTHER): Payer: BC Managed Care – PPO

## 2012-06-16 DIAGNOSIS — E039 Hypothyroidism, unspecified: Secondary | ICD-10-CM

## 2012-11-22 ENCOUNTER — Other Ambulatory Visit: Payer: Self-pay | Admitting: Internal Medicine

## 2012-11-26 ENCOUNTER — Other Ambulatory Visit: Payer: Self-pay

## 2012-12-29 ENCOUNTER — Other Ambulatory Visit (INDEPENDENT_AMBULATORY_CARE_PROVIDER_SITE_OTHER): Payer: BC Managed Care – PPO

## 2012-12-29 ENCOUNTER — Telehealth: Payer: Self-pay | Admitting: *Deleted

## 2012-12-29 DIAGNOSIS — Z0000 Encounter for general adult medical examination without abnormal findings: Secondary | ICD-10-CM

## 2012-12-29 LAB — LIPID PANEL
Cholesterol: 192 mg/dL (ref 0–200)
HDL: 60.7 mg/dL (ref >39.00)
LDL Cholesterol: 121 mg/dL — ABNORMAL HIGH (ref 0–99)
Triglycerides: 53 mg/dL (ref 0.0–149.0)
VLDL: 10.6 mg/dL (ref 0.0–40.0)

## 2012-12-29 LAB — URINALYSIS, ROUTINE W REFLEX MICROSCOPIC
Bilirubin Urine: NEGATIVE
Hgb urine dipstick: NEGATIVE
Ketones, ur: NEGATIVE
Nitrite: NEGATIVE
Total Protein, Urine: NEGATIVE
Urine Glucose: NEGATIVE
pH: 6 (ref 5.0–8.0)

## 2012-12-29 LAB — HEPATIC FUNCTION PANEL
ALT: 29 U/L (ref 0–53)
AST: 30 U/L (ref 0–37)
Alkaline Phosphatase: 56 U/L (ref 39–117)
Bilirubin, Direct: 0.1 mg/dL (ref 0.0–0.3)
Total Bilirubin: 0.8 mg/dL (ref 0.3–1.2)

## 2012-12-29 LAB — CBC WITH DIFFERENTIAL/PLATELET
Eosinophils Relative: 2.9 % (ref 0.0–5.0)
HCT: 46.7 % (ref 39.0–52.0)
Hemoglobin: 15.8 g/dL (ref 13.0–17.0)
Lymphs Abs: 3.1 10*3/uL (ref 0.7–4.0)
MCV: 93.7 fl (ref 78.0–100.0)
Monocytes Absolute: 0.7 10*3/uL (ref 0.1–1.0)
Monocytes Relative: 11.4 % (ref 3.0–12.0)
Neutro Abs: 1.8 10*3/uL (ref 1.4–7.7)
Neutrophils Relative %: 31.5 % — ABNORMAL LOW (ref 43.0–77.0)
Platelets: 220 10*3/uL (ref 150.0–400.0)
WBC: 5.7 10*3/uL (ref 4.5–10.5)

## 2012-12-29 LAB — TSH: TSH: 5.69 u[IU]/mL — ABNORMAL HIGH (ref 0.35–5.50)

## 2012-12-29 LAB — BASIC METABOLIC PANEL
BUN: 21 mg/dL (ref 6–23)
Chloride: 100 mEq/L (ref 96–112)
GFR: 88.62 mL/min (ref >60.00)
Glucose, Bld: 82 mg/dL (ref 70–99)
Potassium: 3.7 mEq/L (ref 3.5–5.1)
Sodium: 135 mEq/L (ref 135–145)

## 2012-12-29 NOTE — Telephone Encounter (Signed)
Left msg  On vm pt is waiting in the lab for cpx labs to be entered. CPX 01/06/13...lmb

## 2013-01-06 ENCOUNTER — Encounter: Payer: Self-pay | Admitting: Internal Medicine

## 2013-01-06 ENCOUNTER — Ambulatory Visit (INDEPENDENT_AMBULATORY_CARE_PROVIDER_SITE_OTHER): Payer: BC Managed Care – PPO | Admitting: Internal Medicine

## 2013-01-06 VITALS — BP 130/82 | HR 71 | Temp 98.2°F | Ht 70.0 in | Wt 182.0 lb

## 2013-01-06 DIAGNOSIS — E039 Hypothyroidism, unspecified: Secondary | ICD-10-CM

## 2013-01-06 DIAGNOSIS — Z0000 Encounter for general adult medical examination without abnormal findings: Secondary | ICD-10-CM

## 2013-01-06 MED ORDER — DULOXETINE HCL 30 MG PO CPEP: 30.0000 mg | ORAL_CAPSULE | Freq: Every day | ORAL | Status: DC

## 2013-01-06 MED ORDER — LEVOTHYROXINE SODIUM 75 MCG PO TABS: 75.0000 ug | ORAL_TABLET | Freq: Every day | ORAL | Status: DC

## 2013-01-06 MED ORDER — ESOMEPRAZOLE MAGNESIUM 40 MG PO CPDR: 40.0000 mg | DELAYED_RELEASE_CAPSULE | Freq: Every day | ORAL | Status: DC

## 2013-01-06 NOTE — Progress Notes (Signed)
Subjective:    Patient ID: Jacob [name], male    DOB: Apr 05, 1980, 32 y.o.   MRN: [id number]  HPI  patient is here today for annual physical. Patient feels well and has no complaints. Also reviewed chronic medical issues interval medical events  Past Medical History  Diagnosis Date  . H/O alcohol abuse     sober since 02/04/2007  . Depression   . GERD (gastroesophageal reflux disease)   . Genital warts   . 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 Hiatal hernia 03/17/2006  . Anxiety    Family History  Problem Relation Age of Onset  . Heart disease Father   . Hyperlipidemia Father   . Hypertension Father   . Diabetes Other   . Stroke Other   . Heart disease Other    History  Substance Use Topics  . Smoking status: Former Smoker    Types: Cigarettes    Quit date: 03/28/2008  . Smokeless tobacco: Never Used  . Alcohol Use: No    Review of Systems  Constitutional: Negative for fever, activity change, appetite change, fatigue and unexpected weight change.  Respiratory: Negative for cough, chest tightness, shortness of breath and wheezing.   Cardiovascular: Negative for chest pain, palpitations and leg swelling.  Neurological: Negative for dizziness, weakness and headaches.  Psychiatric/Behavioral: Negative for dysphoric mood. The patient is not nervous/anxious.   All other systems reviewed and are negative.       Objective:   Physical Exam BP 130/82  Pulse 71  Temp(Src) 98.2 F (36.8 C) (Oral)  Ht 5\' 10"  (1.778 m)  Wt 182 lb (82.555 kg)  BMI 26.11 kg/m2  SpO2 98% Wt Readings from Last 3 Encounters:  01/06/13 182 lb (82.555 kg)  04/24/12 177 lb 8 oz (80.513 kg)  04/06/12 179 lb (81.194 kg)   Constitutional: he appears well-developed and well-nourished. No distress.  HENT: Head: Normocephalic and atraumatic. Ears: B TMs ok, no erythema or effusion; Nose: Nose normal. Mouth/Throat: Oropharynx is clear  and moist. No oropharyngeal exudate.  Eyes: Conjunctivae and EOM are normal. Pupils are equal, round, and reactive to light. No scleral icterus.  Neck: Normal range of motion. Neck supple. No JVD present. No thyromegaly present.  Cardiovascular: Normal rate, regular rhythm and normal heart sounds.  No murmur heard. No BLE edema. Pulmonary/Chest: Effort normal and breath sounds normal. No respiratory distress. he has no wheezes.  Abdominal: Soft. Bowel sounds are normal. he exhibits no distension. There is no tenderness. no masses GU: defer at pt request Musculoskeletal: Normal range of motion, no joint effusions. No gross deformities Neurological: he is alert and oriented to person, place, and time. No cranial nerve deficit. Coordination, balance, strength, speech and gait are normal.  Skin: Skin is warm and dry. No rash noted. No erythema.  Psychiatric: he has a normal mood and affect. behavior is normal. Judgment and thought content normal.  Lab Results  Component Value Date   WBC 5.7 12/29/2012   HGB 15.8 12/29/2012   HCT 46.7 12/29/2012   PLT 220.0 12/29/2012   GLUCOSE 82 12/29/2012   CHOL 192 12/29/2012   TRIG 53.0 12/29/2012   HDL 60.70 12/29/2012   LDLCALC 121* 12/29/2012   ALT 29 12/29/2012   AST 30 12/29/2012   NA 135 12/29/2012   K 3.7 12/29/2012   CL 100 12/29/2012   CREATININE 1.0 12/29/2012   BUN 21 12/29/2012  CO2 29 12/29/2012   TSH 5.69* 12/29/2012       Assessment & Plan:   CPX/v70.0 - Patient has been counseled on age-appropriate routine health concerns for screening and prevention. These are reviewed and up-to-date. Immunizations are up-to-date or declined. Labs reviewed.  Also See problem list. Medications and labs reviewed today.

## 2013-01-06 NOTE — Patient Instructions (Addendum)
It was good to see you today.  Health Maintenance reviewed - all recommended immunizations and age-appropriate screenings are up-to-date.  We have reviewed your prior records including labs and tests today  Medications reviewed and updated, increase synthroid dose to 75 mcg daily - no other changes recommended at this time.  Refill on other medication(s) as discussed today.  follow up tsh lab only in 6 mo -   Please schedule followup in 6 months (if desire to wean off Cymbalta medications) or 12 months for (annual visit/pysical), call sooner if problems.    Health Maintenance, Males A healthy lifestyle and preventative care can promote health and wellness.  Maintain regular health, dental, and eye exams.  Eat a healthy diet. Foods like vegetables, fruits, whole grains, low-fat dairy products, and lean protein foods contain the nutrients you need without too many calories. Decrease your intake of foods high in solid fats, added sugars, and salt. Get information about a proper diet from your caregiver, if necessary.  Regular physical exercise is one of the most important things you can do for your health. Most adults should get at least 150 minutes of moderate-intensity exercise (any activity that increases your heart rate and causes you to sweat) each week. In addition, most adults need muscle-strengthening exercises on 2 or more days a week.   Maintain a healthy weight. The body mass index (BMI) is a screening tool to identify possible weight problems. It provides an estimate of body fat based on height and weight. Your caregiver can help determine your BMI, and can help you achieve or maintain a healthy weight. For adults 20 years and older:  A BMI below 18.5 is considered underweight.  A BMI of 18.5 to 24.9 is normal.  A BMI of 25 to 29.9 is considered overweight.  A BMI of 30 and above is considered obese.  Maintain normal blood lipids and cholesterol by exercising and  minimizing your intake of saturated fat. Eat a balanced diet with plenty of fruits and vegetables. Blood tests for lipids and cholesterol should begin at age 47 and be repeated every 5 years. If your lipid or cholesterol levels are high, you are over 50, or you are a high risk for heart disease, you may need your cholesterol levels checked more frequently.Ongoing high lipid and cholesterol levels should be treated with medicines, if diet and exercise are not effective.  If you smoke, find out from your caregiver how to quit. If you do not use tobacco, do not start.  Lung cancer screening is recommended for adults aged 38 80 years who are at high risk for developing lung cancer because of a history of smoking. Yearly low-dose computed tomography (CT) is recommended for people who have at least a 30-pack-year history of smoking and are a current smoker or have quit within the past 15 years. A pack year of smoking is smoking an average of 1 pack of cigarettes a day for 1 year (for example: 1 pack a day for 30 years or 2 packs a day for 15 years). Yearly screening should continue until the smoker has stopped smoking for at least 15 years. Yearly screening should also be stopped for people who develop a health problem that would prevent them from having lung cancer treatment.  If you choose to drink alcohol, do not exceed 2 drinks per day. One drink is considered to be 12 ounces (355 mL) of beer, 5 ounces (148 mL) of wine, or 1.5 ounces (44 mL) of  liquor.  Avoid use of street drugs. Do not share needles with anyone. Ask for help if you need support or instructions about stopping the use of drugs.  High blood pressure causes heart disease and increases the risk of stroke. Blood pressure should be checked at least every 1 to 2 years. Ongoing high blood pressure should be treated with medicines if weight loss and exercise are not effective.  If you are 37 to 32 years old, ask your caregiver if you should take  aspirin to prevent heart disease.  Diabetes screening involves taking a blood sample to check your fasting blood sugar level. This should be done once every 3 years, after age 52, if you are within normal weight and without risk factors for diabetes. Testing should be considered at a younger age or be carried out more frequently if you are overweight and have at least 1 risk factor for diabetes.  Colorectal cancer can be detected and often prevented. Most routine colorectal cancer screening begins at the age of 22 and continues through age 87. However, your caregiver may recommend screening at an earlier age if you have risk factors for colon cancer. On a yearly basis, your caregiver may provide home test kits to check for hidden blood in the stool. Use of a small camera at the end of a tube, to directly examine the colon (sigmoidoscopy or colonoscopy), can detect the earliest forms of colorectal cancer. Talk to your caregiver about this at age 87, when routine screening begins. Direct examination of the colon should be repeated every 5 to 10 years through age 51, unless early forms of pre-cancerous polyps or small growths are found.  Hepatitis C blood testing is recommended for all people born from [birth year] through 1965 and any individual with known risks for hepatitis C.  Healthy men should no longer receive prostate-specific antigen (PSA) blood tests as part of routine cancer screening. Consult with your caregiver about prostate cancer screening.  Testicular cancer screening is not recommended for adolescents or adult males who have no symptoms. Screening includes self-exam, caregiver exam, and other screening tests. Consult with your caregiver about any symptoms you have or any concerns you have about testicular cancer.  Practice safe sex. Use condoms and avoid high-risk sexual practices to reduce the spread of sexually transmitted infections (STIs).  Use sunscreen. Apply sunscreen liberally and  repeatedly throughout the day. You should seek shade when your shadow is shorter than you. Protect yourself by wearing long sleeves, pants, a wide-brimmed hat, and sunglasses year round, whenever you are outdoors.  Notify your caregiver of new moles or changes in moles, especially if there is a change in shape or color. Also notify your caregiver if a mole is larger than the size of a pencil eraser.  A one-time screening for abdominal aortic aneurysm (AAA) and surgical repair of large AAAs by sound wave imaging (ultrasonography) is recommended for ages 79 to 40 years who are current or former smokers.  Stay current with your immunizations. Document Released: 07/06/2007 Document Revised: 05/04/2012 Document Reviewed: 06/04/2010 [facility] Patient Information 2014 [place], [place].

## 2013-01-06 NOTE — Assessment & Plan Note (Signed)
increase synthroid to 75 mcg and recheck TSH in 6 months Titrate as needed Lab Results  Component Value Date   TSH 5.69* 12/29/2012

## 2013-01-06 NOTE — Progress Notes (Signed)
Pre-visit discussion using our clinic review tool. No additional management support is needed unless otherwise documented below in the visit note.  

## 2013-04-05 ENCOUNTER — Ambulatory Visit (INDEPENDENT_AMBULATORY_CARE_PROVIDER_SITE_OTHER): Payer: BC Managed Care – PPO | Admitting: Physician Assistant

## 2013-04-05 ENCOUNTER — Other Ambulatory Visit (INDEPENDENT_AMBULATORY_CARE_PROVIDER_SITE_OTHER): Payer: BC Managed Care – PPO

## 2013-04-05 ENCOUNTER — Encounter: Payer: Self-pay | Admitting: Physician Assistant

## 2013-04-05 VITALS — BP 132/78 | HR 80 | Temp 98.8°F | Wt 184.1 lb

## 2013-04-05 DIAGNOSIS — R1031 Right lower quadrant pain: Secondary | ICD-10-CM

## 2013-04-05 LAB — CBC WITH DIFFERENTIAL/PLATELET
BASOS ABS: 0 10*3/uL (ref 0.0–0.1)
Basophils Relative: 0.7 % (ref 0.0–3.0)
EOS ABS: 0.1 10*3/uL (ref 0.0–0.7)
Eosinophils Relative: 2.2 % (ref 0.0–5.0)
HCT: 47 % (ref 39.0–52.0)
HEMOGLOBIN: 15.8 g/dL (ref 13.0–17.0)
LYMPHS ABS: 1.9 10*3/uL (ref 0.7–4.0)
Lymphocytes Relative: 41.7 % (ref 12.0–46.0)
MCHC: 33.7 g/dL (ref 30.0–36.0)
MCV: 94.7 fl (ref 78.0–100.0)
MONO ABS: 0.6 10*3/uL (ref 0.1–1.0)
Monocytes Relative: 12.6 % — ABNORMAL HIGH (ref 3.0–12.0)
NEUTROS ABS: 2 10*3/uL (ref 1.4–7.7)
Neutrophils Relative %: 42.8 % — ABNORMAL LOW (ref 43.0–77.0)
Platelets: 205 10*3/uL (ref 150.0–400.0)
RBC: 4.96 Mil/uL (ref 4.22–5.81)
RDW: 12.4 % (ref 11.5–14.6)
WBC: 4.6 10*3/uL (ref 4.5–10.5)

## 2013-04-05 LAB — BASIC METABOLIC PANEL
BUN: 16 mg/dL (ref 6–23)
CO2: 28 mEq/L (ref 19–32)
Calcium: 8.7 mg/dL (ref 8.4–10.5)
Chloride: 103 mEq/L (ref 96–112)
Creatinine, Ser: 1.1 mg/dL (ref 0.4–1.5)
GFR: 85.59 mL/min (ref >60.00)
Glucose, Bld: 91 mg/dL (ref 70–99)
Potassium: 4.1 mEq/L (ref 3.5–5.1)
Sodium: 139 mEq/L (ref 135–145)

## 2013-04-05 LAB — SEDIMENTATION RATE: Sed Rate: 3 mm/hr (ref 0–22)

## 2013-04-05 NOTE — Patient Instructions (Signed)
It was great meeting you today [name]!   I have ordered a few labs for you today, please report to the lab in the lower level.   Abdominal Pain, Adult Many things can cause belly (abdominal) pain. Most times, the belly pain is not dangerous. Many cases of belly pain can be watched and treated at home. HOME CARE   Do not take medicines that help you go poop (laxatives) unless told to by your doctor.  Only take medicine as told by your doctor.  Eat or drink as told by your doctor. Your doctor will tell you if you should be on a special diet. GET HELP IF:  You do not know what is causing your belly pain.  You have belly pain while you are sick to your stomach (nauseous) or have runny poop (diarrhea).  You have pain while you pee or poop.  Your belly pain wakes you up at night.  You have belly pain that gets worse or better when you eat.  You have belly pain that gets worse when you eat fatty foods. GET HELP RIGHT AWAY IF:   The pain does not go away within 2 hours.  You have a fever.  You keep throwing up (vomiting).  The pain changes and is only in the right or left part of the belly.  You have bloody or tarry looking poop. MAKE SURE YOU:   Understand these instructions.  Will watch your condition.  Will get help right away if you are not doing well or get worse. Document Released: 06/26/2007 Document Revised: 10/28/2012 Document Reviewed: 09/16/2012 [facility] Patient Information 2014 [place].   Diet for Diarrhea, Adult Frequent, runny stools (diarrhea) may be caused or worsened by food or drink. Diarrhea may be relieved by changing your diet. Since diarrhea can last up to 7 days, it is easy for you to lose too much fluid from the body and become dehydrated. Fluids that are lost need to be replaced. Along with a modified diet, make sure you drink enough fluids to keep your urine clear or pale yellow. DIET INSTRUCTIONS  Ensure adequate fluid intake  (hydration): have 1 cup (8 oz) of fluid for each diarrhea episode. Avoid fluids that contain simple sugars or sports drinks, fruit juices, whole milk products, and sodas. Your urine should be clear or pale yellow if you are drinking enough fluids. Hydrate with an oral rehydration solution that you can purchase at pharmacies, retail stores, and online. You can prepare an oral rehydration solution at home by mixing the following ingredients together:    tsp table salt.   tsp baking soda.   tsp salt substitute containing potassium chloride.  1  tablespoons sugar.  1 L (34 oz) of water.  Certain foods and beverages may increase the speed at which food moves through the gastrointestinal (GI) tract. These foods and beverages should be avoided and include:  Caffeinated and alcoholic beverages.  High-fiber foods, such as raw fruits and vegetables, nuts, seeds, and whole grain breads and cereals.  Foods and beverages sweetened with sugar alcohols, such as xylitol, sorbitol, and mannitol.  Some foods may be well tolerated and may help thicken stool including:  Starchy foods, such as rice, toast, pasta, low-sugar cereal, oatmeal, grits, baked potatoes, crackers, and bagels.   Bananas.   Applesauce.  Add probiotic-rich foods to help increase healthy bacteria in the GI tract, such as yogurt and fermented milk products. RECOMMENDED FOODS AND BEVERAGES Starches Choose foods with less than  2 g of fiber per serving.  Recommended:  White, [place], and pita breads, plain rolls, buns, bagels. Plain muffins, matzo. Soda, saltine, or graham crackers. Pretzels, melba toast, zwieback. Cooked cereals made with water: cornmeal, farina, cream cereals. Dry cereals: refined corn, wheat, rice. Potatoes prepared any way without skins, refined macaroni, spaghetti, noodles, refined rice.  Avoid:  Bread, rolls, or crackers made with whole wheat, multi-grains, rye, bran seeds, nuts, or coconut. Corn tortillas or  taco shells. Cereals containing whole grains, multi-grains, bran, coconut, nuts, raisins. Cooked or dry oatmeal. Coarse wheat cereals, granola. Cereals advertised as "high-fiber." Potato skins. Whole grain pasta, wild or brown rice. Popcorn. Sweet potatoes, yams. Sweet rolls, doughnuts, waffles, pancakes, sweet breads. Vegetables  Recommended: Strained tomato and vegetable juices. Most well-cooked and canned vegetables without seeds. Fresh: Tender lettuce, cucumber without the skin, cabbage, spinach, bean sprouts.  Avoid: Fresh, cooked, or canned: Artichokes, baked beans, beet greens, broccoli, Brussels sprouts, corn, kale, legumes, peas, sweet potatoes. Cooked: Green or red cabbage, spinach. Avoid large servings of any vegetables because vegetables shrink when cooked, and they contain more fiber per serving than fresh vegetables. Fruit  Recommended: Cooked or canned: Apricots, applesauce, cantaloupe, cherries, fruit cocktail, grapefruit, grapes, kiwi, mandarin oranges, peaches, pears, plums, watermelon. Fresh: Apples without skin, ripe banana, grapes, cantaloupe, cherries, grapefruit, peaches, oranges, plums. Keep servings limited to  cup or 1 piece.  Avoid: Fresh: Apples with skin, apricots, mangoes, pears, raspberries, strawberries. Prune juice, stewed or dried prunes. Dried fruits, raisins, dates. Large servings of all fresh fruits. Protein  Recommended: Ground or well-cooked tender beef, ham, veal, lamb, pork, or poultry. Eggs. Fish, oysters, shrimp, lobster, other seafoods. Liver, organ meats.  Avoid: Tough, fibrous meats with gristle. Peanut butter, smooth or chunky. Cheese, nuts, seeds, legumes, dried peas, beans, lentils. Dairy  Recommended: Yogurt, lactose-free milk, kefir, drinkable yogurt, buttermilk, soy milk, or plain hard cheese.  Avoid: Milk, chocolate milk, beverages made with milk, such as milkshakes. Soups  Recommended: Bouillon, broth, or soups made from allowed foods. Any  strained soup.  Avoid: Soups made from vegetables that are not allowed, cream or milk-based soups. Desserts and Sweets  Recommended: Sugar-free gelatin, sugar-free frozen ice pops made without sugar alcohol.  Avoid: Plain cakes and cookies, pie made with fruit, pudding, custard, cream pie. Gelatin, fruit, ice, sherbet, frozen ice pops. Ice cream, ice milk without nuts. Plain hard candy, honey, jelly, molasses, syrup, sugar, chocolate syrup, gumdrops, marshmallows. Fats and Oils  Recommended: Limit fats to less than 8 tsp per day.  Avoid: Seeds, nuts, olives, avocados. Margarine, butter, cream, mayonnaise, salad oils, plain salad dressings. Plain gravy, crisp bacon without rind. Beverages  Recommended: Water, decaffeinated teas, oral rehydration solutions, sugar-free beverages not sweetened with sugar alcohols.  Avoid: Fruit juices, caffeinated beverages (coffee, tea, soda), alcohol, sports drinks, or lemon-lime soda. Condiments  Recommended: Ketchup, mustard, horseradish, vinegar, cocoa powder. Spices in moderation: allspice, basil, bay leaves, celery powder or leaves, cinnamon, cumin powder, curry powder, ginger, mace, marjoram, onion or garlic powder, oregano, paprika, parsley flakes, ground pepper, rosemary, sage, savory, tarragon, thyme, turmeric.  Avoid: Coconut, honey. Document Released: 03/30/2003 Document Revised: 10/02/2011 Document Reviewed: 05/24/2011 [facility] Patient Information 2014 [place].

## 2013-04-05 NOTE — Progress Notes (Signed)
Pre visit review using our clinic review tool, if applicable. No additional management support is needed unless otherwise documented below in the visit note. 

## 2013-04-05 NOTE — Progress Notes (Signed)
Subjective:    Patient ID: Jacob [name], male    DOB: 1980/07/26, 33 y.o.   MRN: [id number]  Abdominal Pain This is a new problem. Episode onset: three days PTA. The onset quality is gradual. The problem occurs intermittently. Progression since onset: was worse on second day, today is day three and pain is gone. The pain is located in the RLQ. The patient is experiencing no pain. The quality of the pain is sharp. The abdominal pain does not radiate. Associated symptoms include anorexia (on day two, back to normal today), diarrhea (on day two, resolved with form stool today) and nausea (day two, resolved today). Pertinent negatives include no arthralgias, constipation, dysuria, fever, frequency, headaches, hematochezia, hematuria, melena, myalgias or vomiting. Nothing aggravates the pain. The pain is relieved by nothing. He has tried nothing for the symptoms. His past medical history is significant for GERD. There is no history of abdominal surgery, Crohn's disease or PUD. still has appendix    Review of Systems  Constitutional: Positive for appetite change (decreased for one day, resolved now). Negative for fever and chills.  Gastrointestinal: Positive for nausea (day two, resolved today), abdominal pain, diarrhea (on day two, resolved with form stool today) and anorexia (on day two, back to normal today). Negative for vomiting, constipation, blood in stool, melena and hematochezia.  Genitourinary: Negative for dysuria, frequency, hematuria, scrotal swelling and testicular pain.  Musculoskeletal: Negative for arthralgias and myalgias.  Neurological: Negative for dizziness, weakness and headaches.   Past Medical History  Diagnosis Date  . H/O alcohol abuse     sober since 02/04/2007  . Depression   . GERD (gastroesophageal reflux disease)     w/ HH  . Genital warts   . 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 Anxiety   .  Hypothyroid        Objective:   Physical Exam  Vitals reviewed. Constitutional: He is oriented to person, place, and time. He appears well-developed and well-nourished. No distress.  HENT:  Head: Normocephalic and atraumatic.  Right Ear: External ear normal.  Left Ear: External ear normal.  Eyes: Conjunctivae are normal.  Neck: Normal range of motion.  Cardiovascular: Normal rate and regular rhythm.  Exam reveals no gallop and no friction rub.   No murmur heard. Pulmonary/Chest: Effort normal and breath sounds normal. He has no wheezes. He has no rales.  Abdominal: Soft. Normal appearance. There is no tenderness. There is no rigidity, no rebound, no guarding, no tenderness at McBurney's point and negative Murphy's sign.  negative Rovsing's, abdomen non tender.  Musculoskeletal: Normal range of motion.  Neurological: He is alert and oriented to person, place, and time.  Skin: Skin is warm and dry.  Psychiatric: He has a normal mood and affect.   Lab Results  Component Value Date   WBC 4.6 04/05/2013   HGB 15.8 04/05/2013   HCT 47.0 04/05/2013   PLT 205.0 04/05/2013   GLUCOSE 91 04/05/2013   CHOL 192 12/29/2012   TRIG 53.0 12/29/2012   HDL 60.70 12/29/2012   LDLCALC 121* 12/29/2012   ALT 29 12/29/2012   AST 30 12/29/2012   NA 139 04/05/2013   K 4.1 04/05/2013   CL 103 04/05/2013   CREATININE 1.1 04/05/2013   BUN 16 04/05/2013   CO2 28 04/05/2013   TSH 5.69* 12/29/2012   Filed Vitals:   04/05/13 1618  BP: 132/78  Pulse: 80  Temp: 98.8 F (37.1 C)       Assessment & Plan:    Abdominal pain: Low suspicion for acute abdomen. Afebrile, symptoms of pain, anorexia and nausea improved.  Labs ordered today: CBC, Sed rate, BMET. Patient instructed to phone office if symptoms return/worsen. May require CT with contrast of abdomen.   Patient states understanding and agreement with treatment plan.

## 2013-05-19 ENCOUNTER — Telehealth: Payer: Self-pay | Admitting: Internal Medicine

## 2013-05-19 NOTE — Telephone Encounter (Signed)
Patient is calling requesting to speak with Jacob [name] about his thyroid medication. Please advise.

## 2013-05-19 NOTE — Telephone Encounter (Signed)
Called pt he wanted to know when he ned to return to have his thyroid check. Inform pt per chart md has an order inhere to return for lab (TSH) 6/15...Jacob [name]

## 2013-06-18 ENCOUNTER — Ambulatory Visit: Payer: BC Managed Care – PPO | Admitting: Internal Medicine

## 2013-06-20 ENCOUNTER — Other Ambulatory Visit: Payer: Self-pay | Admitting: Internal Medicine

## 2013-06-24 ENCOUNTER — Other Ambulatory Visit: Payer: Self-pay | Admitting: Internal Medicine

## 2013-07-05 ENCOUNTER — Other Ambulatory Visit (INDEPENDENT_AMBULATORY_CARE_PROVIDER_SITE_OTHER): Payer: BC Managed Care – PPO

## 2013-07-05 DIAGNOSIS — E039 Hypothyroidism, unspecified: Secondary | ICD-10-CM

## 2013-07-05 LAB — TSH: TSH: 2.29 u[IU]/mL (ref 0.35–4.50)

## 2013-08-04 ENCOUNTER — Encounter: Payer: Self-pay | Admitting: Infectious Diseases

## 2013-08-04 ENCOUNTER — Ambulatory Visit (INDEPENDENT_AMBULATORY_CARE_PROVIDER_SITE_OTHER): Payer: BC Managed Care – PPO | Admitting: Infectious Diseases

## 2013-08-04 ENCOUNTER — Telehealth: Payer: Self-pay | Admitting: *Deleted

## 2013-08-04 VITALS — BP 135/82 | HR 60 | Temp 98.2°F | Ht 70.0 in | Wt 187.0 lb

## 2013-08-04 DIAGNOSIS — A4902 Methicillin resistant Staphylococcus aureus infection, unspecified site: Secondary | ICD-10-CM

## 2013-08-04 MED ORDER — MUPIROCIN CALCIUM 2 % NA OINT: 1.0000 "application " | TOPICAL_OINTMENT | Freq: Two times a day (BID) | NASAL | Status: DC

## 2013-08-04 MED ORDER — SULFAMETHOXAZOLE-TMP DS 800-160 MG PO TABS: 1.0000 | ORAL_TABLET | Freq: Every day | ORAL | Status: DC

## 2013-08-04 NOTE — Assessment & Plan Note (Signed)
Not clear this is boil, is still quite small with minimal d/c. Will Cx and see if MRSA present and if is resistant to bactrim. Will refill bactrim, mupirocin. Will call pt with results.

## 2013-08-04 NOTE — Progress Notes (Signed)
   Subjective:    Patient ID: Jacob [name], male    DOB: August 27, 1980, 33 y.o.   MRN: [id number]  HPI 33 yo M with hx of recurrent MRSA infections which had previously with Avelox, cipro, ceftin and bactrim. Has Prev had episodes on forearm, thigh. No previous Cxs. He was prev started on (04-02-10) mupirocin, chlorhexidine baths. His Immunoglobulin levels, CH50, HIV RNA were all nl/negative. Had small lesion on his LUE and saw urgent care. Was given 10 days of bactrim and topical (? Can't rember name).  He was seen in ID 2012 and given doxy, CHG baths, mupirocin.  Returns now after being on daily bactrim for 1 year and now developing new lesion on L elbow for last 24h.  Has only had 2 lesions on last 18 months.   Review of Systems  Constitutional: Negative for fever and chills.       Objective:   Physical Exam  Constitutional: He appears well-developed and well-nourished.  Skin:             Assessment & Plan:

## 2013-08-04 NOTE — Telephone Encounter (Signed)
MRSA outbreak - requesting culture. Was told by Dr. Johnnye Sima to call when he is having an outbreak so that the lesion can be cultured.  Dr. Johnnye Sima approved work-in appt for 10 AM today.

## 2013-08-06 LAB — WOUND CULTURE
GRAM STAIN: NONE SEEN
Gram Stain: NONE SEEN
Gram Stain: NONE SEEN

## 2013-08-09 ENCOUNTER — Telehealth: Payer: Self-pay | Admitting: Infectious Diseases

## 2013-08-09 NOTE — Telephone Encounter (Signed)
Called pt to let him know that his cx grew CNS.  Has had 2 boils, in last 18 months.  Would like to have MRSA taken off his chart. I suggested he would need to have screening done (nasal, anal swabs).  Will stop his bactrim unless he has recurrence.

## 2013-12-12 ENCOUNTER — Encounter: Payer: Self-pay | Admitting: Internal Medicine

## 2013-12-13 NOTE — Telephone Encounter (Signed)
If there is any cancellation or availability, ok to work in (if its possible) thanks

## 2014-01-05 ENCOUNTER — Other Ambulatory Visit: Payer: Self-pay | Admitting: Internal Medicine

## 2014-01-05 ENCOUNTER — Other Ambulatory Visit (INDEPENDENT_AMBULATORY_CARE_PROVIDER_SITE_OTHER): Payer: BC Managed Care – PPO

## 2014-01-05 DIAGNOSIS — Z0000 Encounter for general adult medical examination without abnormal findings: Secondary | ICD-10-CM

## 2014-01-05 LAB — LIPID PANEL
Cholesterol: 186 mg/dL (ref 0–200)
HDL: 58.7 mg/dL (ref >39.00)
LDL CALC: 115 mg/dL — AB (ref 0–99)
NonHDL: 127.3
Total CHOL/HDL Ratio: 3
Triglycerides: 63 mg/dL (ref 0.0–149.0)
VLDL: 12.6 mg/dL (ref 0.0–40.0)

## 2014-01-05 LAB — HEPATIC FUNCTION PANEL
ALT: 19 U/L (ref 0–53)
AST: 22 U/L (ref 0–37)
Albumin: 4.1 g/dL (ref 3.5–5.2)
Alkaline Phosphatase: 51 U/L (ref 39–117)
BILIRUBIN TOTAL: 0.6 mg/dL (ref 0.2–1.2)
Bilirubin, Direct: 0 mg/dL (ref 0.0–0.3)
Total Protein: 6.5 g/dL (ref 6.0–8.3)

## 2014-01-05 LAB — BASIC METABOLIC PANEL
BUN: 16 mg/dL (ref 6–23)
CHLORIDE: 107 meq/L (ref 96–112)
CO2: 27 mEq/L (ref 19–32)
CREATININE: 1 mg/dL (ref 0.4–1.5)
Calcium: 8.7 mg/dL (ref 8.4–10.5)
GFR: 95.52 mL/min (ref >60.00)
Glucose, Bld: 95 mg/dL (ref 70–99)
Potassium: 4 mEq/L (ref 3.5–5.1)
Sodium: 138 mEq/L (ref 135–145)

## 2014-01-05 LAB — URINALYSIS, ROUTINE W REFLEX MICROSCOPIC
BILIRUBIN URINE: NEGATIVE
HGB URINE DIPSTICK: NEGATIVE
Ketones, ur: NEGATIVE
Leukocytes, UA: NEGATIVE
Nitrite: NEGATIVE
RBC / HPF: NONE SEEN (ref 0–?)
Specific Gravity, Urine: 1.02 (ref 1.000–1.030)
TOTAL PROTEIN, URINE-UPE24: NEGATIVE
Urine Glucose: NEGATIVE
Urobilinogen, UA: 0.2 (ref 0.0–1.0)
WBC UA: NONE SEEN (ref 0–?)
pH: 6 (ref 5.0–8.0)

## 2014-01-05 LAB — CBC WITH DIFFERENTIAL/PLATELET
BASOS ABS: 0 10*3/uL (ref 0.0–0.1)
Basophils Relative: 0.6 % (ref 0.0–3.0)
EOS PCT: 2.8 % (ref 0.0–5.0)
Eosinophils Absolute: 0.2 10*3/uL (ref 0.0–0.7)
HCT: 46.5 % (ref 39.0–52.0)
Hemoglobin: 15.4 g/dL (ref 13.0–17.0)
LYMPHS PCT: 38.5 % (ref 12.0–46.0)
Lymphs Abs: 2.5 10*3/uL (ref 0.7–4.0)
MCHC: 33.2 g/dL (ref 30.0–36.0)
MCV: 93.6 fl (ref 78.0–100.0)
MONOS PCT: 11.5 % (ref 3.0–12.0)
Monocytes Absolute: 0.7 10*3/uL (ref 0.1–1.0)
Neutro Abs: 3 10*3/uL (ref 1.4–7.7)
Neutrophils Relative %: 46.6 % (ref 43.0–77.0)
PLATELETS: 236 10*3/uL (ref 150.0–400.0)
RBC: 4.96 Mil/uL (ref 4.22–5.81)
RDW: 12.7 % (ref 11.5–15.5)
WBC: 6.5 10*3/uL (ref 4.0–10.5)

## 2014-01-05 LAB — TSH: TSH: 3.34 u[IU]/mL (ref 0.35–4.50)

## 2014-01-10 ENCOUNTER — Ambulatory Visit (INDEPENDENT_AMBULATORY_CARE_PROVIDER_SITE_OTHER): Payer: BC Managed Care – PPO | Admitting: Internal Medicine

## 2014-01-10 ENCOUNTER — Encounter: Payer: BC Managed Care – PPO | Admitting: Internal Medicine

## 2014-01-10 ENCOUNTER — Encounter: Payer: Self-pay | Admitting: Internal Medicine

## 2014-01-10 VITALS — BP 132/70 | HR 57 | Temp 97.6°F | Ht 70.0 in | Wt 186.2 lb

## 2014-01-10 DIAGNOSIS — E039 Hypothyroidism, unspecified: Secondary | ICD-10-CM

## 2014-01-10 DIAGNOSIS — Z0000 Encounter for general adult medical examination without abnormal findings: Secondary | ICD-10-CM

## 2014-01-10 NOTE — Patient Instructions (Addendum)
It was good to see you today.  We have reviewed your prior records including labs and tests today  Health Maintenance reviewed - all recommended immunizations and age-appropriate screenings are up-to-date.  Test(s) ordered today. Your results will be released to [place] (or called to you) after review, usually within 72hours after test completion. If any changes need to be made, you will be notified at that same time.  Medications reviewed and updated, no changes recommended at this time.  Please schedule followup in 12 months for annual exam and labs, call sooner if problems.  Health Maintenance A healthy lifestyle and preventative care can promote health and wellness.  Maintain regular health, dental, and eye exams.  Eat a healthy diet. Foods like vegetables, fruits, whole grains, low-fat dairy products, and lean protein foods contain the nutrients you need and are low in calories. Decrease your intake of foods high in solid fats, added sugars, and salt. Get information about a proper diet from your health care provider, if necessary.  Regular physical exercise is one of the most important things you can do for your health. Most adults should get at least 150 minutes of moderate-intensity exercise (any activity that increases your heart rate and causes you to sweat) each week. In addition, most adults need muscle-strengthening exercises on 2 or more days a week.   Maintain a healthy weight. The body mass index (BMI) is a screening tool to identify possible weight problems. It provides an estimate of body fat based on height and weight. Your health care provider can find your BMI and can help you achieve or maintain a healthy weight. For males 20 years and older:  A BMI below 18.5 is considered underweight.  A BMI of 18.5 to 24.9 is normal.  A BMI of 25 to 29.9 is considered overweight.  A BMI of 30 and above is considered obese.  Maintain normal blood lipids and cholesterol by  exercising and minimizing your intake of saturated fat. Eat a balanced diet with plenty of fruits and vegetables. Blood tests for lipids and cholesterol should begin at age 27 and be repeated every 5 years. If your lipid or cholesterol levels are high, you are over age 35, or you are at high risk for heart disease, you may need your cholesterol levels checked more frequently.Ongoing high lipid and cholesterol levels should be treated with medicines if diet and exercise are not working.  If you smoke, find out from your health care provider how to quit. If you do not use tobacco, do not start.  Lung cancer screening is recommended for adults aged 35-80 years who are at high risk for developing lung cancer because of a history of smoking. A yearly low-dose CT scan of the lungs is recommended for people who have at least a 30-pack-year history of smoking and are current smokers or have quit within the past 15 years. A pack year of smoking is smoking an average of 1 pack of cigarettes a day for 1 year (for example, a 30-pack-year history of smoking could mean smoking 1 pack a day for 30 years or 2 packs a day for 15 years). Yearly screening should continue until the smoker has stopped smoking for at least 15 years. Yearly screening should be stopped for people who develop a health problem that would prevent them from having lung cancer treatment.  If you choose to drink alcohol, do not have more than 2 drinks per day. One drink is considered to be 12 oz (  360 mL) of beer, 5 oz (150 mL) of wine, or 1.5 oz (45 mL) of liquor.  Avoid the use of street drugs. Do not share needles with anyone. Ask for help if you need support or instructions about stopping the use of drugs.  High blood pressure causes heart disease and increases the risk of stroke. Blood pressure should be checked at least every 1-2 years. Ongoing high blood pressure should be treated with medicines if weight loss and exercise are not  effective.  If you are 32-40 years old, ask your health care provider if you should take aspirin to prevent heart disease.  Diabetes screening involves taking a blood sample to check your fasting blood sugar level. This should be done once every 3 years after age 54 if you are at a normal weight and without risk factors for diabetes. Testing should be considered at a younger age or be carried out more frequently if you are overweight and have at least 1 risk factor for diabetes.  Colorectal cancer can be detected and often prevented. Most routine colorectal cancer screening begins at the age of 43 and continues through age 72. However, your health care provider may recommend screening at an earlier age if you have risk factors for colon cancer. On a yearly basis, your health care provider may provide home test kits to check for hidden blood in the stool. A small camera at the end of a tube may be used to directly examine the colon (sigmoidoscopy or colonoscopy) to detect the earliest forms of colorectal cancer. Talk to your health care provider about this at age 76 when routine screening begins. A direct exam of the colon should be repeated every 5-10 years through age 3, unless early forms of precancerous polyps or small growths are found.  People who are at an increased risk for hepatitis B should be screened for this virus. You are considered at high risk for hepatitis B if:  You were born in a country where hepatitis B occurs often. Talk with your health care provider about which countries are considered high risk.  Your parents were born in a high-risk country and you have not received a shot to protect against hepatitis B (hepatitis B vaccine).  You have HIV or AIDS.  You use needles to inject street drugs.  You live with, or have sex with, someone who has hepatitis B.  You are a man who has sex with other men (MSM).  You get hemodialysis treatment.  You take certain medicines for  conditions like cancer, organ transplantation, and autoimmune conditions.  Hepatitis C blood testing is recommended for all people born from [birth year] through 1965 and any individual with known risk factors for hepatitis C.  Healthy men should no longer receive prostate-specific antigen (PSA) blood tests as part of routine cancer screening. Talk to your health care provider about prostate cancer screening.  Testicular cancer screening is not recommended for adolescents or adult males who have no symptoms. Screening includes self-exam, a health care provider exam, and other screening tests. Consult with your health care provider about any symptoms you have or any concerns you have about testicular cancer.  Practice safe sex. Use condoms and avoid high-risk sexual practices to reduce the spread of sexually transmitted infections (STIs).  You should be screened for STIs, including gonorrhea and chlamydia if:  You are sexually active and are younger than 24 years.  You are older than 24 years, and your health care provider tells you  that you are at risk for this type of infection.  Your sexual activity has changed since you were last screened, and you are at an increased risk for chlamydia or gonorrhea. Ask your health care provider if you are at risk.  If you are at risk of being infected with HIV, it is recommended that you take a prescription medicine daily to prevent HIV infection. This is called pre-exposure prophylaxis (PrEP). You are considered at risk if:  You are a man who has sex with other men (MSM).  You are a heterosexual man who is sexually active with multiple partners.  You take drugs by injection.  You are sexually active with a partner who has HIV.  Talk with your health care provider about whether you are at high risk of being infected with HIV. If you choose to begin PrEP, you should first be tested for HIV. You should then be tested every 3 months for as long as you are taking  PrEP.  Use sunscreen. Apply sunscreen liberally and repeatedly throughout the day. You should seek shade when your shadow is shorter than you. Protect yourself by wearing long sleeves, pants, a wide-brimmed hat, and sunglasses year round whenever you are outdoors.  Tell your health care provider of new moles or changes in moles, especially if there is a change in shape or color. Also, tell your health care provider if a mole is larger than the size of a pencil eraser.  A one-time screening for abdominal aortic aneurysm (AAA) and surgical repair of large AAAs by ultrasound is recommended for men aged 67-75 years who are current or former smokers.  Stay current with your vaccines (immunizations). Document Released: 07/06/2007 Document Revised: 01/12/2013 Document Reviewed: 06/04/2010 [facility] Patient Information 2015 [place], [place]. This information is not intended to replace advice given to you by your health care provider. Make sure you discuss any questions you have with your health care provider.

## 2014-01-10 NOTE — Progress Notes (Signed)
Pre visit review using our clinic review tool, if applicable. No additional management support is needed unless otherwise documented below in the visit note. 

## 2014-01-10 NOTE — Assessment & Plan Note (Signed)
Last dose change synthroid to 75 mcg 12/2012 - doing well recheck TSH in 6-12 months and titrate as needed Lab Results  Component Value Date   TSH 3.34 01/05/2014

## 2014-01-10 NOTE — Progress Notes (Signed)
Subjective:    Patient ID: Jacob [name], male    DOB: 10-19-1980, 32 y.o.   MRN: [id number]  HPI  patient is here today for annual physical. Patient feels well and has no complaints.  Also reviewed chronic medical issues and interval medical events  Past Medical History  Diagnosis Date  . H/O alcohol abuse     sober since 02/04/2007  . Depression   . GERD (gastroesophageal reflux disease)     w/ HH  . Genital warts   . 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 Anxiety   . Hypothyroid    Family History  Problem Relation Age of Onset  . Heart disease Father 68  . Hyperlipidemia Father   . Hypertension Father   . Diabetes Sister 41   History  Substance Use Topics  . Smoking status: Former Smoker    Types: Cigarettes    Quit date: 03/28/2008  . Smokeless tobacco: Never Used     Comment: using lozenges  . Alcohol Use: No    Review of Systems  Constitutional: Negative for fever, activity change, appetite change, fatigue and unexpected weight change.  Respiratory: Negative for cough, chest tightness, shortness of breath and wheezing.   Cardiovascular: Negative for chest pain, palpitations and leg swelling.  Neurological: Negative for dizziness, weakness and headaches.  Psychiatric/Behavioral: Negative for dysphoric mood. The patient is not nervous/anxious.   All other systems reviewed and are negative.      Objective:   Physical Exam  BP 132/70 mmHg  Pulse 57  Temp(Src) 97.6 F (36.4 C) (Oral)  Ht 5\' 10"  (1.778 m)  Wt 186 lb 4 oz (84.482 kg)  BMI 26.72 kg/m2  SpO2 97% Wt Readings from Last 3 Encounters:  01/10/14 186 lb 4 oz (84.482 kg)  08/04/13 187 lb (84.823 kg)  04/05/13 184 lb 1.9 oz (83.516 kg)   Constitutional: he appears well-developed and well-nourished. No distress.  HENT: Head: Normocephalic and atraumatic. Ears: B TMs ok, no erythema or effusion; Nose: Nose normal.  Mouth/Throat: Oropharynx is clear and moist. No oropharyngeal exudate.  Eyes: Conjunctivae and EOM are normal. Pupils are equal, round, and reactive to light. No scleral icterus.  Neck: Normal range of motion. Neck supple. No JVD present. No thyromegaly present.  Cardiovascular: Normal rate, regular rhythm and normal heart sounds.  No murmur heard. No BLE edema. Pulmonary/Chest: Effort normal and breath sounds normal. No respiratory distress. he has no wheezes.  Abdominal: Soft. Bowel sounds are normal. he exhibits no distension. There is no tenderness. no masses GU: defer Musculoskeletal: Normal range of motion, no joint effusions. No gross deformities Neurological: he is alert and oriented to person, place, and time. No cranial nerve deficit. Coordination, balance, strength, speech and gait are normal.  Skin: Skin is warm and dry. No rash noted. No erythema.  Psychiatric: he has a normal mood and affect. behavior is normal. Judgment and thought content normal.  Lab Results  Component Value Date   WBC 6.5 01/05/2014   HGB 15.4 01/05/2014   HCT 46.5 01/05/2014   PLT 236.0 01/05/2014   GLUCOSE 95 01/05/2014   CHOL 186 01/05/2014   TRIG 63.0 01/05/2014   HDL 58.70 01/05/2014   LDLCALC 115* 01/05/2014   ALT 19 01/05/2014   AST 22 01/05/2014   NA 138 01/05/2014   K 4.0 01/05/2014   CL 107 01/05/2014   CREATININE 1.0  01/05/2014   BUN 16 01/05/2014   CO2 27 01/05/2014   TSH 3.34 01/05/2014    US Abdomen Complete  04/10/2012   *RADIOLOGY REPORT*  Clinical Data:  Epigastric pain, reflux  COMPLETE ABDOMINAL ULTRASOUND  Comparison:  None.  Findings:  Gallbladder:  The gallbladder is visualized and no gallstones are noted.  There is no pain over the gallbladder with compression.  Common bile duct:  The common bile duct is normal measuring 2.7 mm in diameter.  Liver:  The liver has a normal echogenic pattern.  No ductal dilatation is seen.  IVC:  Appears normal.  Pancreas:  No focal  abnormality seen.  Spleen:  The spleen is normal measuring 6.5 cm sagittally.  Right Kidney:  No hydronephrosis is seen.  The right kidney measures 11.2 cm sagittally.  Left Kidney:  No hydronephrosis is noted.  The left kidney measures 12.0 cm.  Abdominal aorta:  The abdominal aorta is normal in caliber.  IMPRESSION: Negative abdominal ultrasound.  No gallstones.  No ductal dilatation.   Original Report Authenticated By: Ivar Drape, M.D.       Assessment & Plan:   CPX/z00.00 - Patient has been counseled on age-appropriate routine health concerns for screening and prevention. These are reviewed and up-to-date. Immunizations are up-to-date or declined. Labs and reviewed.

## 2014-03-13 ENCOUNTER — Encounter: Payer: Self-pay | Admitting: Internal Medicine

## 2014-03-14 ENCOUNTER — Other Ambulatory Visit: Payer: Self-pay

## 2014-03-14 MED ORDER — LEVOTHYROXINE SODIUM 75 MCG PO TABS
75.0000 ug | ORAL_TABLET | Freq: Every day | ORAL | Status: DC
Start: 2014-03-14 — End: 2014-09-08

## 2014-03-14 MED ORDER — DULOXETINE HCL 30 MG PO CPEP: 30.0000 mg | ORAL_CAPSULE | Freq: Every day | ORAL | Status: DC

## 2014-03-19 ENCOUNTER — Other Ambulatory Visit: Payer: Self-pay | Admitting: Internal Medicine

## 2014-03-28 ENCOUNTER — Encounter: Payer: Self-pay | Admitting: Internal Medicine

## 2014-03-29 MED ORDER — CIPROFLOXACIN HCL 500 MG PO TABS: 500.0000 mg | ORAL_TABLET | Freq: Two times a day (BID) | ORAL | Status: DC

## 2014-03-29 MED ORDER — DOXYCYCLINE HYCLATE 100 MG PO TABS: 100.0000 mg | ORAL_TABLET | Freq: Every day | ORAL | Status: DC

## 2014-03-31 MED ORDER — TYPHOID VACCINE PO CPDR: 1.0000 | DELAYED_RELEASE_CAPSULE | ORAL | Status: DC

## 2014-03-31 NOTE — Addendum Note (Signed)
Addended by: [name] A on: 03/31/2014 01:32 PM   Modules accepted: Orders

## 2014-03-31 NOTE — Telephone Encounter (Signed)
Please call and make nurse visit to start pt on Hep B and varicella vaccine series thanks

## 2014-04-01 NOTE — Telephone Encounter (Signed)
Called pt made nurse visit for injections. Will have to get varicella from another site inform management we need for 04/05/14...Jacob [name]

## 2014-04-05 ENCOUNTER — Ambulatory Visit (INDEPENDENT_AMBULATORY_CARE_PROVIDER_SITE_OTHER): Payer: BLUE CROSS/BLUE SHIELD

## 2014-04-05 ENCOUNTER — Other Ambulatory Visit: Payer: BLUE CROSS/BLUE SHIELD

## 2014-04-05 DIAGNOSIS — Z23 Encounter for immunization: Secondary | ICD-10-CM

## 2014-04-05 DIAGNOSIS — R760 Raised antibody titer: Secondary | ICD-10-CM

## 2014-04-06 LAB — VARICELLA ZOSTER ANTIBODY, IGG: VARICELLA IGG: 831.8 {index} — AB (ref <135.00)

## 2014-05-06 ENCOUNTER — Ambulatory Visit (INDEPENDENT_AMBULATORY_CARE_PROVIDER_SITE_OTHER): Payer: BLUE CROSS/BLUE SHIELD

## 2014-05-06 DIAGNOSIS — Z23 Encounter for immunization: Secondary | ICD-10-CM | POA: Diagnosis not present

## 2014-05-17 ENCOUNTER — Encounter: Payer: Self-pay | Admitting: Internal Medicine

## 2014-05-20 NOTE — Telephone Encounter (Signed)
Please sched appt with Dr Tamala Julian and lt=et pt know same Thanks!

## 2014-05-20 NOTE — Telephone Encounter (Signed)
Printed msg gave to schedulers to make appt with Dr. Tamala Julian...Jacob [name]

## 2014-06-03 ENCOUNTER — Ambulatory Visit: Payer: BLUE CROSS/BLUE SHIELD | Admitting: Family Medicine

## 2014-06-22 ENCOUNTER — Encounter: Payer: Self-pay | Admitting: Family Medicine

## 2014-06-22 ENCOUNTER — Ambulatory Visit (INDEPENDENT_AMBULATORY_CARE_PROVIDER_SITE_OTHER): Payer: BLUE CROSS/BLUE SHIELD | Admitting: Family Medicine

## 2014-06-22 VITALS — BP 124/78 | HR 76 | Ht 70.0 in | Wt 183.0 lb

## 2014-06-22 DIAGNOSIS — M533 Sacrococcygeal disorders, not elsewhere classified: Secondary | ICD-10-CM | POA: Insufficient documentation

## 2014-06-22 DIAGNOSIS — M9904 Segmental and somatic dysfunction of sacral region: Secondary | ICD-10-CM

## 2014-06-22 DIAGNOSIS — M999 Biomechanical lesion, unspecified: Secondary | ICD-10-CM | POA: Insufficient documentation

## 2014-06-22 NOTE — Patient Instructions (Addendum)
Good to see you.  Ice 20 minutes 2 times daily. Usually after activity and before bed. Exercises 3 times a week.  pennsaid pinkie amount topically 2 times daily as needed.  Start legs 50% and increase 10% a week.  Sacroiliac Joint Mobilization and Rehab 1. Work on pretzel stretching, shoulder back and leg draped in front. 3-5 sets, 30 sec.. 2. hip abductor rotations. standing, hip flexion and rotation outward then inward. 3 sets, 15 reps. when can do comfortably, add ankle weights starting at 2 pounds.  3. cross over stretching - shoulder back to ground, same side leg crossover. 3-5 sets for 30 min..  4. rolling up and back knees to chest and rocking. 5. sacral tilt - 5 sets, hold for 5-10 seconds  Exercises on wall.  Heel and butt touching.  Raise leg 6 inches and hold 2 seconds.  Down slow for count of 4 seconds.  1 set of 30 reps daily on both sides.  See me again in 3 weeks if pain.

## 2014-06-22 NOTE — Progress Notes (Signed)
[name] [address] [place], [name] [address] Phone: [phone] Subjective:    I'm seeing this patient by the request  of:  [name], MD   CC: Back pain lower.  [id number] Jacob [name] is a 34 y.o. male coming in with complaint of lower back pain. Patient states that this seems to be mostly on the left side. Discuss pain as a dull throbbing aching sensation. Only occurs after certain things such as squats. Patient states that last for proximal only 4 days and then seems to resolve on its own. Patient states anti-inflammatory skin be helpful. Denies any radiation or any numbness or tingling.  Patient states at this time and seems to be improved and he has changed some of the exercises. Patient overall will do better. Patient is concerned as he would like to continue to workout and avoid any significant difficulty.  Past Medical History  Diagnosis Date  . H/O alcohol abuse     sober since 02/04/2007  . Depression   . GERD (gastroesophageal reflux disease)     w/ HH  . Genital warts   . 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 Anxiety   . Hypothyroid    Past Surgical History  Procedure Laterality Date  . Wrist surgery      left   History  Substance Use Topics  . Smoking status: Former Smoker    Types: Cigarettes    Quit date: 03/28/2008  . Smokeless tobacco: Never Used     Comment: using lozenges  . Alcohol Use: No   Family History  Problem Relation Age of Onset  . Heart disease Father 25  . Hyperlipidemia Father   . Hypertension Father   . Diabetes Sister 60        Past medical history, social, surgical and family history all reviewed in electronic medical record.   Review of Systems: No headache, visual changes, nausea, vomiting, diarrhea, constipation, dizziness, abdominal pain, skin rash, fevers, chills, night sweats, weight loss, swollen  lymph nodes, body aches, joint swelling, muscle aches, chest pain, shortness of breath, mood changes.   Objective Height 5\' 10"  (1.778 m), weight 186 lb (84.369 kg).  General: No apparent distress alert and oriented x3 mood and affect normal, dressed appropriately.  HEENT: Pupils equal, extraocular movements intact  Respiratory: Patient's speak in full sentences and does not appear short of breath  Cardiovascular: No lower extremity edema, non tender, no erythema  Skin: Warm dry intact with no signs of infection or rash on extremities or on axial skeleton.  Abdomen: Soft nontender  Neuro: Cranial nerves II through XII are intact, neurovascularly intact in all extremities with 2+ DTRs and 2+ pulses.  Lymph: No lymphadenopathy of posterior or anterior cervical chain or axillae bilaterally.  Gait normal with good balance and coordination.  MSK:  Non tender with full range of motion and good stability and symmetric strength and tone of shoulders, elbows, wrist, hip, knee and ankles bilaterally.  Back Exam:  Inspection: Unremarkable  Motion: Flexion 45 deg, Extension 25 deg, Side Bending to 45 deg bilaterally,  Rotation to 45 deg bilaterally  SLR laying: Negative  XSLR laying: Negative  Palpable tenderness: Tender left sacroiliac joint FABER: Left Sensory change: Gross sensation intact to all lumbar and sacral dermatomes.  Reflexes: 2+ at both patellar tendons, 2+ at achilles tendons, Babinski's downgoing.  Strength at  foot  Plantar-flexion: 5/5 Dorsi-flexion: 5/5 Eversion: 5/5 Inversion: 5/5  Leg strength  Quad: 5/5 Hamstring: 5/5 Hip flexor: 5/5 Hip abductors: 3/5  Gait unremarkable. Osteopathic findings Left on left  Procedure note 03403; 15 minutes spent for Therapeutic exercises as stated in above notes.  This included exercises focusing on stretching, strengthening, with significant focus on eccentric aspects. Sacroiliac Joint Mobilization and Rehab 1. Work on pretzel stretching,  shoulder back and leg draped in front. 3-5 sets, 30 sec.. 2. hip abductor rotations. standing, hip flexion and rotation outward then inward. 3 sets, 15 reps. when can do comfortably, add ankle weights starting at 2 pounds.  3. cross over stretching - shoulder back to ground, same side leg crossover. 3-5 sets for 30 min..  4. rolling up and back knees to chest and rocking. 5. sacral tilt - 5 sets, hold for 5-10 seconds   Proper technique shown and discussed handout in great detail with ATC.  All questions were discussed and answered.     Impression and Recommendations:     This case required medical decision making of moderate complexity.

## 2014-06-22 NOTE — Progress Notes (Signed)
Pre visit review using our clinic review tool, if applicable. No additional management support is needed unless otherwise documented below in the visit note. 

## 2014-06-22 NOTE — Assessment & Plan Note (Signed)
Decision today to treat with OMT was based on Physical Exam  After verbal consent patient was treated with HVLA, ME techniques in sacral areas  Patient tolerated the procedure well with improvement in symptoms  Patient given exercises, stretches and lifestyle modifications  See medications in patient instructions if given  Patient will follow up in 3 weeks

## 2014-06-22 NOTE — Assessment & Plan Note (Signed)
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 We discussed icing regimen, home exercises, as well as patient work with Product/process development scientist. Patient is can work on hip abductor strengthening. We discussed which activities to potentially avoid and slowly increasing with an exercise prescription. Patient given a trial topical anti-prematures. Patient come back and see me again in 3-4 weeks if continuing to have difficulty.

## 2014-07-13 ENCOUNTER — Ambulatory Visit: Payer: BLUE CROSS/BLUE SHIELD | Admitting: Family Medicine

## 2014-09-07 ENCOUNTER — Encounter: Payer: Self-pay | Admitting: Internal Medicine

## 2014-09-08 MED ORDER — LEVOTHYROXINE SODIUM 75 MCG PO TABS: 75.0000 ug | ORAL_TABLET | Freq: Every day | ORAL | Status: DC

## 2014-09-08 MED ORDER — DULOXETINE HCL 30 MG PO CPEP: 30.0000 mg | ORAL_CAPSULE | Freq: Every day | ORAL | Status: DC

## 2014-12-26 ENCOUNTER — Encounter: Payer: Self-pay | Admitting: Internal Medicine

## 2014-12-26 DIAGNOSIS — Z0000 Encounter for general adult medical examination without abnormal findings: Secondary | ICD-10-CM

## 2015-01-09 ENCOUNTER — Other Ambulatory Visit (INDEPENDENT_AMBULATORY_CARE_PROVIDER_SITE_OTHER): Payer: BLUE CROSS/BLUE SHIELD

## 2015-01-09 DIAGNOSIS — Z0000 Encounter for general adult medical examination without abnormal findings: Secondary | ICD-10-CM | POA: Diagnosis not present

## 2015-01-09 LAB — CBC WITH DIFFERENTIAL/PLATELET
BASOS ABS: 0 10*3/uL (ref 0.0–0.1)
Basophils Relative: 0.5 % (ref 0.0–3.0)
EOS ABS: 0.2 10*3/uL (ref 0.0–0.7)
Eosinophils Relative: 2.5 % (ref 0.0–5.0)
HCT: 47.4 % (ref 39.0–52.0)
HEMOGLOBIN: 16.1 g/dL (ref 13.0–17.0)
Lymphocytes Relative: 58.3 % — ABNORMAL HIGH (ref 12.0–46.0)
Lymphs Abs: 3.5 10*3/uL (ref 0.7–4.0)
MCHC: 34.1 g/dL (ref 30.0–36.0)
MCV: 92.2 fl (ref 78.0–100.0)
MONO ABS: 0.8 10*3/uL (ref 0.1–1.0)
Monocytes Relative: 13.3 % — ABNORMAL HIGH (ref 3.0–12.0)
Neutro Abs: 1.5 10*3/uL (ref 1.4–7.7)
Neutrophils Relative %: 25.4 % — ABNORMAL LOW (ref 43.0–77.0)
Platelets: 237 10*3/uL (ref 150.0–400.0)
RBC: 5.14 Mil/uL (ref 4.22–5.81)
RDW: 13.8 % (ref 11.5–15.5)
WBC: 6 10*3/uL (ref 4.0–10.5)

## 2015-01-09 LAB — BASIC METABOLIC PANEL
BUN: 22 mg/dL (ref 6–23)
CO2: 31 mEq/L (ref 19–32)
CREATININE: 0.98 mg/dL (ref 0.40–1.50)
Calcium: 9.7 mg/dL (ref 8.4–10.5)
Chloride: 103 mEq/L (ref 96–112)
GFR: 92.71 mL/min (ref >60.00)
Glucose, Bld: 94 mg/dL (ref 70–99)
Potassium: 4.1 mEq/L (ref 3.5–5.1)
Sodium: 140 mEq/L (ref 135–145)

## 2015-01-09 LAB — LIPID PANEL
CHOL/HDL RATIO: 3
Cholesterol: 176 mg/dL (ref 0–200)
HDL: 57.3 mg/dL (ref >39.00)
LDL CALC: 105 mg/dL — AB (ref 0–99)
NonHDL: 118.62
TRIGLYCERIDES: 67 mg/dL (ref 0.0–149.0)
VLDL: 13.4 mg/dL (ref 0.0–40.0)

## 2015-01-09 LAB — URINALYSIS, ROUTINE W REFLEX MICROSCOPIC
Hgb urine dipstick: NEGATIVE
Ketones, ur: NEGATIVE
Leukocytes, UA: NEGATIVE
Nitrite: NEGATIVE
RBC / HPF: NONE SEEN (ref 0–?)
SPECIFIC GRAVITY, URINE: 1.02 (ref 1.000–1.030)
Total Protein, Urine: NEGATIVE
URINE GLUCOSE: NEGATIVE
Urobilinogen, UA: 0.2 (ref 0.0–1.0)
WBC UA: NONE SEEN (ref 0–?)
pH: 6.5 (ref 5.0–8.0)

## 2015-01-09 LAB — TSH: TSH: 7.36 u[IU]/mL — AB (ref 0.35–4.50)

## 2015-01-09 LAB — HEPATIC FUNCTION PANEL
ALK PHOS: 52 U/L (ref 39–117)
ALT: 29 U/L (ref 0–53)
AST: 23 U/L (ref 0–37)
Albumin: 4.4 g/dL (ref 3.5–5.2)
BILIRUBIN DIRECT: 0.1 mg/dL (ref 0.0–0.3)
BILIRUBIN TOTAL: 0.8 mg/dL (ref 0.2–1.2)
Total Protein: 7.1 g/dL (ref 6.0–8.3)

## 2015-01-10 LAB — HIV ANTIBODY (ROUTINE TESTING W REFLEX): HIV 1&2 Ab, 4th Generation: NONREACTIVE

## 2015-01-12 ENCOUNTER — Encounter: Payer: Self-pay | Admitting: Internal Medicine

## 2015-01-12 ENCOUNTER — Ambulatory Visit (INDEPENDENT_AMBULATORY_CARE_PROVIDER_SITE_OTHER): Payer: BLUE CROSS/BLUE SHIELD | Admitting: Internal Medicine

## 2015-01-12 VITALS — BP 122/80 | HR 64 | Temp 97.5°F | Ht 70.0 in | Wt 183.5 lb

## 2015-01-12 DIAGNOSIS — E039 Hypothyroidism, unspecified: Secondary | ICD-10-CM | POA: Diagnosis not present

## 2015-01-12 DIAGNOSIS — Z0000 Encounter for general adult medical examination without abnormal findings: Secondary | ICD-10-CM | POA: Diagnosis not present

## 2015-01-12 MED ORDER — LEVOTHYROXINE SODIUM 88 MCG PO TABS: 88.0000 ug | ORAL_TABLET | Freq: Every day | ORAL | Status: DC

## 2015-01-12 NOTE — Progress Notes (Signed)
Subjective:    Patient ID: Jacob [name], male    DOB: 1980/04/05, 34 y.o.   MRN: [id number]  HPI  patient is here today for annual physical. Patient feels well and has no complaints. Also reviewed chronic medical conditions, interval events and current concerns  Past Medical History  Diagnosis Date  . H/O alcohol abuse     sober since 02/04/2007  . Depression   . GERD (gastroesophageal reflux disease)     w/ HH  . Genital warts   . 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 Anxiety   . Hypothyroid    Family History  Problem Relation Age of Onset  . Heart disease Father 14  . Hyperlipidemia Father   . Hypertension Father   . Diabetes Sister 28   Social History  Substance Use Topics  . Smoking status: Former Smoker    Types: Cigarettes    Quit date: 03/28/2008  . Smokeless tobacco: Never Used     Comment: using lozenges  . Alcohol Use: No    Review of Systems  Constitutional: Positive for fatigue (daily). Negative for fever, activity change, appetite change and unexpected weight change.  Respiratory: Negative for cough, chest tightness, shortness of breath and wheezing.   Cardiovascular: Negative for chest pain, palpitations and leg swelling.  Neurological: Negative for dizziness, weakness and headaches.  Psychiatric/Behavioral: Negative for dysphoric mood. The patient is not nervous/anxious.   All other systems reviewed and are negative.      Objective:    Physical Exam  Constitutional: He is oriented to person, place, and time. He appears well-developed and well-nourished. No distress.  HENT:  Head: Normocephalic and atraumatic.  Nose: Nose normal.  Mouth/Throat: Oropharynx is clear and moist.  Hearing grossly normal.  Eyes: Conjunctivae and EOM are normal. Pupils are equal, round, and reactive to light. No scleral icterus.  Neck: Normal range of motion. Neck supple. No JVD present. No  thyromegaly present.  Cardiovascular: Normal rate, regular rhythm, normal heart sounds and intact distal pulses.  Exam reveals no friction rub.   No murmur heard. No edema.  Pulmonary/Chest: Effort normal and breath sounds normal. No respiratory distress. He has no wheezes.  Abdominal: Soft. Bowel sounds are normal. He exhibits no distension and no mass. There is no tenderness. There is no guarding.  Genitourinary:  defer  Musculoskeletal: Normal range of motion. He exhibits no edema or tenderness.  Lymphadenopathy:    He has no cervical adenopathy.  Neurological: He is alert and oriented to person, place, and time. He has normal reflexes. No cranial nerve deficit.  Skin: Skin is warm and dry. No rash noted. No erythema.  Psychiatric: He has a normal mood and affect. His behavior is normal. Thought content normal.    BP 122/80 mmHg  Pulse 64  Temp(Src) 97.5 F (36.4 C) (Oral)  Ht 5\' 10"  (1.778 m)  Wt 183 lb 8 oz (83.235 kg)  BMI 26.33 kg/m2  SpO2 97% Wt Readings from Last 3 Encounters:  01/12/15 183 lb 8 oz (83.235 kg)  06/22/14 183 lb (83.008 kg)  01/10/14 186 lb 4 oz (84.482 kg)    Lab Results  Component Value Date   WBC 6.0 01/09/2015   HGB 16.1 01/09/2015   HCT 47.4 01/09/2015   PLT 237.0 01/09/2015   GLUCOSE 94 01/09/2015   CHOL 176 01/09/2015   TRIG 67.0 01/09/2015   HDL  57.30 01/09/2015   LDLCALC 105* 01/09/2015   ALT 29 01/09/2015   AST 23 01/09/2015   NA 140 01/09/2015   K 4.1 01/09/2015   CL 103 01/09/2015   CREATININE 0.98 01/09/2015   BUN 22 01/09/2015   CO2 31 01/09/2015   TSH 7.36* 01/09/2015    US Abdomen Complete  04/10/2012  *RADIOLOGY REPORT* Clinical Data:  Epigastric pain, reflux COMPLETE ABDOMINAL ULTRASOUND Comparison:  None. Findings: Gallbladder:  The gallbladder is visualized and no gallstones are noted.  There is no pain over the gallbladder with compression. Common bile duct:  The common bile duct is normal measuring 2.7 mm in diameter.  Liver:  The liver has a normal echogenic pattern.  No ductal dilatation is seen. IVC:  Appears normal. Pancreas:  No focal abnormality seen. Spleen:  The spleen is normal measuring 6.5 cm sagittally. Right Kidney:  No hydronephrosis is seen.  The right kidney measures 11.2 cm sagittally. Left Kidney:  No hydronephrosis is noted.  The left kidney measures 12.0 cm. Abdominal aorta:  The abdominal aorta is normal in caliber. IMPRESSION: Negative abdominal ultrasound.  No gallstones.  No ductal dilatation. Original Report Authenticated By: Ivar Drape, M.D.       Assessment & Plan:   CPX/z00.00 -Patient has been counseled on age-appropriate routine health concerns for screening and prevention. These are reviewed and up-to-date. Immunizations are up-to-date or declined. Labs ordered and reviewed.  Problem List Items Addressed This Visit    Hypothyroid    Last dose change synthroid up to 75 mcg 12/2012 will titrate up to 27mcg now (12/2014) due to inc TSH and fatigue recheck TSH in 3 months and titrate as needed  Lab Results  Component Value Date   TSH 7.36* 01/09/2015        Relevant Medications   levothyroxine (SYNTHROID, LEVOTHROID) 88 MCG tablet   Other Relevant Orders   TSH    Other Visit Diagnoses    Routine general medical examination at a health care facility    -  Primary        [name], MD

## 2015-01-12 NOTE — Assessment & Plan Note (Signed)
Last dose change synthroid up to 75 mcg 12/2012 will titrate up to 67mcg now (12/2014) due to inc TSH and fatigue recheck TSH in 3 months and titrate as needed  Lab Results  Component Value Date   TSH 7.36* 01/09/2015

## 2015-01-12 NOTE — Patient Instructions (Addendum)
It was good to see you today.  We have reviewed your prior records including labs and tests today  Health Maintenance reviewed - all recommended immunizations and age-appropriate screenings are up-to-date.  Test(s) ordered today to recheck thyroid week of March 20. Your results will be released to Jacob [name] (or called to you) after review, usually within 72hours after test completion. If any changes need to be made, you will be notified at that same time.  Medications reviewed and updated Increase levothyroxine from 75 g up to 88 g daily; no other changes recommended at this time. Your prescription(s) have been submitted to your pharmacy. Please take as directed and contact our office if you believe you are having problem(s) with the medication(s).  If continued fatigue, will consider brand name Synthroid in place of generic levothyroxine (rather than natural thyroid)  Please schedule followup in 6-12 months for recheck and labs with new PCP Dr. Quay Burow, please call sooner if problems.  Health Maintenance, Male A healthy lifestyle and preventative care can promote health and wellness.  Maintain regular health, dental, and eye exams.  Eat a healthy diet. Foods like vegetables, fruits, whole grains, low-fat dairy products, and lean protein foods contain the nutrients you need and are low in calories. Decrease your intake of foods high in solid fats, added sugars, and salt. Get information about a proper diet from your health care provider, if necessary.  Regular physical exercise is one of the most important things you can do for your health. Most adults should get at least 150 minutes of moderate-intensity exercise (any activity that increases your heart rate and causes you to sweat) each week. In addition, most adults need muscle-strengthening exercises on 2 or more days a week.   Maintain a healthy weight. The body mass index (BMI) is a screening tool to identify possible weight problems. It  provides an estimate of body fat based on height and weight. Your health care provider can find your BMI and can help you achieve or maintain a healthy weight. For males 20 years and older:  A BMI below 18.5 is considered underweight.  A BMI of 18.5 to 24.9 is normal.  A BMI of 25 to 29.9 is considered overweight.  A BMI of 30 and above is considered obese.  Maintain normal blood lipids and cholesterol by exercising and minimizing your intake of saturated fat. Eat a balanced diet with plenty of fruits and vegetables. Blood tests for lipids and cholesterol should begin at age 80 and be repeated every 5 years. If your lipid or cholesterol levels are high, you are over age 101, or you are at high risk for heart disease, you may need your cholesterol levels checked more frequently.Ongoing high lipid and cholesterol levels should be treated with medicines if diet and exercise are not working.  If you smoke, find out from your health care provider how to quit. If you do not use tobacco, do not start.  Lung cancer screening is recommended for adults aged 50-80 years who are at high risk for developing lung cancer because of a history of smoking. A yearly low-dose CT scan of the lungs is recommended for people who have at least a 30-pack-year history of smoking and are current smokers or have quit within the past 15 years. A pack year of smoking is smoking an average of 1 pack of cigarettes a day for 1 year (for example, a 30-pack-year history of smoking could mean smoking 1 pack a day for 30 years  or 2 packs a day for 15 years). Yearly screening should continue until the smoker has stopped smoking for at least 15 years. Yearly screening should be stopped for people who develop a health problem that would prevent them from having lung cancer treatment.  If you choose to drink alcohol, do not have more than 2 drinks per day. One drink is considered to be 12 oz (360 mL) of beer, 5 oz (150 mL) of wine, or 1.5  oz (45 mL) of liquor.  Avoid the use of street drugs. Do not share needles with anyone. Ask for help if you need support or instructions about stopping the use of drugs.  High blood pressure causes heart disease and increases the risk of stroke. High blood pressure is more likely to develop in:  People who have blood pressure in the end of the normal range (100-139/85-89 mm Hg).  People who are overweight or obese.  People who are African American.  If you are 58-30 years of age, have your blood pressure checked every 3-5 years. If you are 64 years of age or older, have your blood pressure checked every year. You should have your blood pressure measured twice--once when you are at a hospital or clinic, and once when you are not at a hospital or clinic. Record the average of the two measurements. To check your blood pressure when you are not at a hospital or clinic, you can use:  An automated blood pressure machine at a pharmacy.  A home blood pressure monitor.  If you are 10-14 years old, ask your health care provider if you should take aspirin to prevent heart disease.  Diabetes screening involves taking a blood sample to check your fasting blood sugar level. This should be done once every 3 years after age 7 if you are at a normal weight and without risk factors for diabetes. Testing should be considered at a younger age or be carried out more frequently if you are overweight and have at least 1 risk factor for diabetes.  Colorectal cancer can be detected and often prevented. Most routine colorectal cancer screening begins at the age of 31 and continues through age 8. However, your health care provider may recommend screening at an earlier age if you have risk factors for colon cancer. On a yearly basis, your health care provider may provide home test kits to check for hidden blood in the stool. A small camera at the end of a tube may be used to directly examine the colon (sigmoidoscopy or  colonoscopy) to detect the earliest forms of colorectal cancer. Talk to your health care provider about this at age 44 when routine screening begins. A direct exam of the colon should be repeated every 5-10 years through age 61, unless early forms of precancerous polyps or small growths are found.  People who are at an increased risk for hepatitis B should be screened for this virus. You are considered at high risk for hepatitis B if:  You were born in a country where hepatitis B occurs often. Talk with your health care provider about which countries are considered high risk.  Your parents were born in a high-risk country and you have not received a shot to protect against hepatitis B (hepatitis B vaccine).  You have HIV or AIDS.  You use needles to inject street drugs.  You live with, or have sex with, someone who has hepatitis B.  You are a man who has sex with other men (  MSM).  You get hemodialysis treatment.  You take certain medicines for conditions like cancer, organ transplantation, and autoimmune conditions.  Hepatitis C blood testing is recommended for all people born from [birth year] through 1965 and any individual with known risk factors for hepatitis C.  Healthy men should no longer receive prostate-specific antigen (PSA) blood tests as part of routine cancer screening. Talk to your health care provider about prostate cancer screening.  Testicular cancer screening is not recommended for adolescents or adult males who have no symptoms. Screening includes self-exam, a health care provider exam, and other screening tests. Consult with your health care provider about any symptoms you have or any concerns you have about testicular cancer.  Practice safe sex. Use condoms and avoid high-risk sexual practices to reduce the spread of sexually transmitted infections (STIs).  You should be screened for STIs, including gonorrhea and chlamydia if:  You are sexually active and are younger than  24 years.  You are older than 24 years, and your health care provider tells you that you are at risk for this type of infection.  Your sexual activity has changed since you were last screened, and you are at an increased risk for chlamydia or gonorrhea. Ask your health care provider if you are at risk.  If you are at risk of being infected with HIV, it is recommended that you take a prescription medicine daily to prevent HIV infection. This is called pre-exposure prophylaxis (PrEP). You are considered at risk if:  You are a man who has sex with other men (MSM).  You are a heterosexual man who is sexually active with multiple partners.  You take drugs by injection.  You are sexually active with a partner who has HIV.  Talk with your health care provider about whether you are at high risk of being infected with HIV. If you choose to begin PrEP, you should first be tested for HIV. You should then be tested every 3 months for as long as you are taking PrEP.  Use sunscreen. Apply sunscreen liberally and repeatedly throughout the day. You should seek shade when your shadow is shorter than you. Protect yourself by wearing long sleeves, pants, a wide-brimmed hat, and sunglasses year round whenever you are outdoors.  Tell your health care provider of new moles or changes in moles, especially if there is a change in shape or color. Also, tell your health care provider if a mole is larger than the size of a pencil eraser.  A one-time screening for abdominal aortic aneurysm (AAA) and surgical repair of large AAAs by ultrasound is recommended for men aged 46-75 years who are current or former smokers.  Stay current with your vaccines (immunizations).   This information is not intended to replace advice given to you by your health care provider. Make sure you discuss any questions you have with your health care provider.   Document Released: 07/06/2007 Document Revised: 01/28/2014 Document Reviewed:  06/04/2010 Elsevier Interactive Patient Education [employer].

## 2015-01-12 NOTE — Progress Notes (Signed)
Pre visit review using our clinic review tool, if applicable. No additional management support is needed unless otherwise documented below in the visit note. 

## 2015-01-13 NOTE — Telephone Encounter (Signed)
TSH future lab is already entered. Pt is currently taking the 75 mcg.

## 2015-01-17 MED ORDER — LEVOTHYROXINE SODIUM 75 MCG PO TABS: 75.0000 ug | ORAL_TABLET | Freq: Every day | ORAL | Status: DC

## 2015-01-17 NOTE — Telephone Encounter (Signed)
Agree =- pt should resume 35mcg to correct elevated TSH rather than increase dose to 26mcg -  erx to [facility] corrected to 38mcg and recheck TSH in 6 wk as planned

## 2015-02-09 ENCOUNTER — Encounter: Payer: Self-pay | Admitting: Internal Medicine

## 2015-02-09 ENCOUNTER — Ambulatory Visit (INDEPENDENT_AMBULATORY_CARE_PROVIDER_SITE_OTHER): Payer: BLUE CROSS/BLUE SHIELD | Admitting: Internal Medicine

## 2015-02-09 ENCOUNTER — Ambulatory Visit (INDEPENDENT_AMBULATORY_CARE_PROVIDER_SITE_OTHER)
Admission: RE | Admit: 2015-02-09 | Discharge: 2015-02-09 | Disposition: A | Discharge status: Home / Self Care | Payer: BLUE CROSS/BLUE SHIELD | Source: Ambulatory Visit | Attending: Internal Medicine | Admitting: Internal Medicine

## 2015-02-09 VITALS — BP 98/70 | HR 57 | Temp 97.9°F | Resp 16 | Ht 70.0 in | Wt 183.0 lb

## 2015-02-09 DIAGNOSIS — R05 Cough: Secondary | ICD-10-CM

## 2015-02-09 DIAGNOSIS — R059 Cough, unspecified: Secondary | ICD-10-CM

## 2015-02-09 DIAGNOSIS — J189 Pneumonia, unspecified organism: Secondary | ICD-10-CM | POA: Diagnosis not present

## 2015-02-09 LAB — POCT INFLUENZA A/B
INFLUENZA B, POC: NEGATIVE
Influenza A, POC: NEGATIVE

## 2015-02-09 MED ORDER — HYDROCODONE-HOMATROPINE 5-1.5 MG/5ML PO SYRP: 5.0000 mL | ORAL_SOLUTION | Freq: Three times a day (TID) | ORAL | Status: DC | PRN

## 2015-02-09 MED ORDER — MOXIFLOXACIN HCL 400 MG PO TABS: 400.0000 mg | ORAL_TABLET | Freq: Every day | ORAL | Status: AC

## 2015-02-09 NOTE — Progress Notes (Signed)
Subjective:  Patient ID: Jacob [name], male    DOB: [birth year]  Age: 34 y.o. MRN: [id number]  CC: Cough   HPI Jacob [name] presents for a 3 day history of worsening cough that is productive of thick brown phlegm. He also complains of chills, night sweats, sore throat and low-grade fever.  Outpatient Prescriptions Prior to Visit  Medication Sig Dispense Refill  . cetirizine (ZYRTEC) 10 MG tablet Take 10 mg by mouth daily.      . DULoxetine (CYMBALTA) 30 MG capsule Take 1 capsule (30 mg total) by mouth daily. 90 capsule 1  . levothyroxine (SYNTHROID, LEVOTHROID) 75 MCG tablet Take 1 tablet (75 mcg total) by mouth daily. 90 tablet 3  . Multiple Vitamin (MULTIVITAMIN) tablet Take 1 tablet by mouth daily.      . nicotine polacrilex (COMMIT) 2 MG lozenge Place 2 mg inside cheek daily.       No facility-administered medications prior to visit.    ROS Review of Systems  Constitutional: Positive for fever, chills and diaphoresis. Negative for fatigue.  HENT: Positive for sore throat. Negative for congestion, sinus pressure, trouble swallowing and voice change.   Eyes: Negative.   Respiratory: Positive for cough. Negative for apnea, choking, chest tightness, shortness of breath, wheezing and stridor.   Cardiovascular: Negative.  Negative for palpitations and leg swelling.  Gastrointestinal: Negative.  Negative for nausea, vomiting, abdominal pain, diarrhea and constipation.  Endocrine: Negative.   Genitourinary: Negative.   Musculoskeletal: Negative.  Negative for myalgias and back pain.  Skin: Negative.  Negative for color change.  Allergic/Immunologic: Negative.   Neurological: Negative.   Hematological: Negative.   Psychiatric/Behavioral: Negative.     Objective:  BP 98/70 mmHg  Pulse 57  Temp(Src) 97.9 F (36.6 C) (Oral)  Resp 16  Ht 5\' 10"  (1.778 m)  Wt 183 lb (83.008 kg)  BMI 26.26 kg/m2  SpO2 98%  BP Readings from Last 3 Encounters:  02/09/15 98/70    01/12/15 122/80  06/22/14 124/78    Wt Readings from Last 3 Encounters:  02/09/15 183 lb (83.008 kg)  01/12/15 183 lb 8 oz (83.235 kg)  06/22/14 183 lb (83.008 kg)    Physical Exam  Constitutional: He is oriented to person, place, and time. No distress.  HENT:  Mouth/Throat: Oropharynx is clear and moist. No oropharyngeal exudate.  Eyes: Conjunctivae are normal. Right eye exhibits no discharge. Left eye exhibits no discharge. No scleral icterus.  Neck: Normal range of motion. Neck supple. No JVD present. No tracheal deviation present. No thyromegaly present.  Cardiovascular: Normal rate, regular rhythm, normal heart sounds and intact distal pulses.  Exam reveals no gallop and no friction rub.   No murmur heard. Pulmonary/Chest: Effort normal and breath sounds normal. No stridor. No respiratory distress. He has no wheezes. He has no rales. He exhibits no tenderness.  Abdominal: Soft. Bowel sounds are normal. He exhibits no distension and no mass. There is no tenderness. There is no rebound and no guarding.  Musculoskeletal: Normal range of motion. He exhibits no edema or tenderness.  Lymphadenopathy:    He has no cervical adenopathy.  Neurological: He is oriented to person, place, and time.  Skin: Skin is warm and dry. No rash noted. He is not diaphoretic. No erythema. No pallor.  Psychiatric: He has a normal mood and affect. His behavior is normal. Judgment and thought content normal.  Vitals reviewed.   Lab Results  Component Value Date   WBC 6.0 01/09/2015  HGB 16.1 01/09/2015   HCT 47.4 01/09/2015   PLT 237.0 01/09/2015   GLUCOSE 94 01/09/2015   CHOL 176 01/09/2015   TRIG 67.0 01/09/2015   HDL 57.30 01/09/2015   LDLCALC 105* 01/09/2015   ALT 29 01/09/2015   AST 23 01/09/2015   NA 140 01/09/2015   K 4.1 01/09/2015   CL 103 01/09/2015   CREATININE 0.98 01/09/2015   BUN 22 01/09/2015   CO2 31 01/09/2015   TSH 7.36* 01/09/2015    US Abdomen Complete  04/10/2012   *RADIOLOGY REPORT* Clinical Data:  Epigastric pain, reflux COMPLETE ABDOMINAL ULTRASOUND Comparison:  None. Findings: Gallbladder:  The gallbladder is visualized and no gallstones are noted.  There is no pain over the gallbladder with compression. Common bile duct:  The common bile duct is normal measuring 2.7 mm in diameter. Liver:  The liver has a normal echogenic pattern.  No ductal dilatation is seen. IVC:  Appears normal. Pancreas:  No focal abnormality seen. Spleen:  The spleen is normal measuring 6.5 cm sagittally. Right Kidney:  No hydronephrosis is seen.  The right kidney measures 11.2 cm sagittally. Left Kidney:  No hydronephrosis is noted.  The left kidney measures 12.0 cm. Abdominal aorta:  The abdominal aorta is normal in caliber. IMPRESSION: Negative abdominal ultrasound.  No gallstones.  No ductal dilatation. Original Report Authenticated By: Ivar Drape, M.D.    Assessment & Plan:   Jacob [name] was seen today for cough.  Diagnoses and all orders for this visit:  Cough- his chest x-ray is normal, his influenza A and B screening was negative. -     HYDROcodone-homatropine (HYCODAN) 5-1.5 MG/5ML syrup; Take 5 mLs by mouth every 8 (eight) hours as needed for cough. -     DG Chest 2 View; Future -     POCT Influenza A/B  Pneumonia, unspecified laterality, unspecified part of lung- though his chest x-ray is normal he clinically appears to have pneumonia. Will treat empirically with Avelox and will offer Hycodan for symptom relief. -     HYDROcodone-homatropine (HYCODAN) 5-1.5 MG/5ML syrup; Take 5 mLs by mouth every 8 (eight) hours as needed for cough. -     moxifloxacin (AVELOX) 400 MG tablet; Take 1 tablet (400 mg total) by mouth daily. -     DG Chest 2 View; Future -     POCT Influenza A/B  I am having Jacob [name] start on HYDROcodone-homatropine and moxifloxacin. I am also having him maintain his multivitamin, nicotine polacrilex, cetirizine, DULoxetine, and levothyroxine.  Meds  ordered this encounter  Medications  . HYDROcodone-homatropine (HYCODAN) 5-1.5 MG/5ML syrup    Sig: Take 5 mLs by mouth every 8 (eight) hours as needed for cough.    Dispense:  120 mL    Refill:  0  . moxifloxacin (AVELOX) 400 MG tablet    Sig: Take 1 tablet (400 mg total) by mouth daily.    Dispense:  7 tablet    Refill:  0     Follow-up: Return in about 3 weeks (around 03/02/2015).  Scarlette Calico, MD

## 2015-02-09 NOTE — Patient Instructions (Signed)

## 2015-02-09 NOTE — Progress Notes (Signed)
Pre visit review using our clinic review tool, if applicable. No additional management support is needed unless otherwise documented below in the visit note. 

## 2015-03-02 ENCOUNTER — Ambulatory Visit: Payer: BLUE CROSS/BLUE SHIELD | Admitting: Internal Medicine

## 2015-05-05 DIAGNOSIS — R5383 Other fatigue: Secondary | ICD-10-CM | POA: Diagnosis not present

## 2015-05-05 DIAGNOSIS — E291 Testicular hypofunction: Secondary | ICD-10-CM | POA: Diagnosis not present

## 2015-05-05 DIAGNOSIS — E039 Hypothyroidism, unspecified: Secondary | ICD-10-CM | POA: Diagnosis not present

## 2015-05-05 DIAGNOSIS — J309 Allergic rhinitis, unspecified: Secondary | ICD-10-CM | POA: Diagnosis not present

## 2015-05-05 DIAGNOSIS — F39 Unspecified mood [affective] disorder: Secondary | ICD-10-CM | POA: Diagnosis not present

## 2015-05-16 DIAGNOSIS — R5383 Other fatigue: Secondary | ICD-10-CM | POA: Diagnosis not present

## 2015-05-16 DIAGNOSIS — E291 Testicular hypofunction: Secondary | ICD-10-CM | POA: Diagnosis not present

## 2015-05-16 DIAGNOSIS — J309 Allergic rhinitis, unspecified: Secondary | ICD-10-CM | POA: Diagnosis not present

## 2015-05-16 DIAGNOSIS — E039 Hypothyroidism, unspecified: Secondary | ICD-10-CM | POA: Diagnosis not present

## 2015-05-24 ENCOUNTER — Telehealth: Payer: Self-pay

## 2015-05-24 NOTE — Telephone Encounter (Signed)
Just schedule his CPE for Dec, He is requesting his labs be put in before his app.

## 2015-06-26 DIAGNOSIS — F39 Unspecified mood [affective] disorder: Secondary | ICD-10-CM | POA: Diagnosis not present

## 2015-06-26 DIAGNOSIS — R5383 Other fatigue: Secondary | ICD-10-CM | POA: Diagnosis not present

## 2015-06-26 DIAGNOSIS — J309 Allergic rhinitis, unspecified: Secondary | ICD-10-CM | POA: Diagnosis not present

## 2015-06-26 DIAGNOSIS — E559 Vitamin D deficiency, unspecified: Secondary | ICD-10-CM | POA: Diagnosis not present

## 2015-06-26 DIAGNOSIS — E291 Testicular hypofunction: Secondary | ICD-10-CM | POA: Diagnosis not present

## 2015-06-26 DIAGNOSIS — E039 Hypothyroidism, unspecified: Secondary | ICD-10-CM | POA: Diagnosis not present

## 2015-07-03 ENCOUNTER — Ambulatory Visit (INDEPENDENT_AMBULATORY_CARE_PROVIDER_SITE_OTHER): Payer: BLUE CROSS/BLUE SHIELD | Admitting: Family

## 2015-07-03 ENCOUNTER — Encounter: Payer: Self-pay | Admitting: Family

## 2015-07-03 VITALS — BP 130/80 | HR 99 | Temp 98.4°F | Ht 70.0 in | Wt 184.0 lb

## 2015-07-03 DIAGNOSIS — R05 Cough: Secondary | ICD-10-CM | POA: Diagnosis not present

## 2015-07-03 DIAGNOSIS — R0981 Nasal congestion: Secondary | ICD-10-CM | POA: Diagnosis not present

## 2015-07-03 DIAGNOSIS — R059 Cough, unspecified: Secondary | ICD-10-CM

## 2015-07-03 NOTE — Patient Instructions (Signed)
Let's stay with the amoxicillin and if it is not working, we will change the antibiotic.   Increase intake of clear fluids. Congestion is best treated by hydration, when mucus is wetter, it is thinner, less sticky, and easier to expel from the body, either through coughing up drainage, or by blowing your nose.   Get plenty of rest.   Use saline nasal drops and blow your nose frequently. Run a humidifier at night and elevate the head of the bed. Vicks Vapor rub will help with congestion and cough. Steam showers and sinus massage for congestion.   Use Acetaminophen or Ibuprofen as needed for fever or pain. Avoid second hand smoke. Even the smallest exposure will worsen symptoms.   Over the counter medications you can try include Delsym for cough, a decongestant for congestion, and Mucinex or Robitussin as an expectorant. Be sure to just get the plain Mucinex or Robitussin that just has one medication (Guaifenesen). We don't recommend the combination products. Note, be sure to drink two glasses of water with each dose of Mucinex as the medication will not work well without adequate hydration.   You can also try a teaspoon of honey to see if this will help reduce cough. Throat lozenges can sometimes be beneficial as well.    This illness will typically last 7 - 10 days.   Please follow up with our clinic if you develop a fever greater than 101 F, symptoms worsen, or do not resolve in the next week.

## 2015-07-03 NOTE — Progress Notes (Signed)
g  Subjective:    Patient ID: Jacob [name], male    DOB: [birth year], 35 y.o.   MRN: [id number]   Jacob [name] is a 34 y.o. male who presents today for an acute visit.    HPI Comments: Patient here for evaluation of sinus pressure 5 days ago, improving. Wanted to come in today for reassurance and 'to be seen in person.'  Describes frontal sinus pressure. Cough started 3 days with thick, congestion.  Endorses aching, frontal headaches x 5 days, sore throat. Started amoxicillin 500mg  BID 2 days ago. Has been taking sudafed and excedrin with some relief.   On way out the door, patient asked for advice as he is seeing an integrative health group in [place] and has been started on Clomid for low testosterone. He read the adverse first effects and said that the headache was one of them; he is worried about it pituitary tumor.  Past Medical History  Diagnosis Date  . H/O alcohol abuse     sober since 02/04/2007  . Depression   . GERD (gastroesophageal reflux disease)     w/ HH  . Genital warts   . 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 Anxiety   . Hypothyroid    Allergies: Review of patient's allergies indicates no known allergies. Current Outpatient Prescriptions on File Prior to Visit  Medication Sig Dispense Refill  . cetirizine (ZYRTEC) 10 MG tablet Take 10 mg by mouth daily.      . DULoxetine (CYMBALTA) 30 MG capsule Take 1 capsule (30 mg total) by mouth daily. 90 capsule 1  . Multiple Vitamin (MULTIVITAMIN) tablet Take 1 tablet by mouth daily.      . nicotine polacrilex (COMMIT) 2 MG lozenge Place 2 mg inside cheek daily.       No current facility-administered medications on file prior to visit.    Social History  Substance Use Topics  . Smoking status: Former Smoker    Types: Cigarettes    Quit date: 03/28/2008  . Smokeless tobacco: Never Used     Comment: using lozenges  . Alcohol Use:  No    Review of Systems  Constitutional: Negative for fever and chills.  HENT: Positive for congestion, sinus pressure and sore throat. Negative for ear pain.   Respiratory: Negative for cough and shortness of breath.   Cardiovascular: Negative for chest pain and palpitations.  Gastrointestinal: Negative for nausea and vomiting.  Neurological: Positive for headaches. Negative for dizziness.      Objective:    BP 130/80 mmHg  Pulse 99  Temp(Src) 98.4 F (36.9 C) (Oral)  Ht 5\' 10"  (1.778 m)  Wt 184 lb (83.462 kg)  BMI 26.40 kg/m2  SpO2 98%   Physical Exam  Constitutional: Vital signs are normal. He appears well-developed and well-nourished.  HENT:  Head: Normocephalic and atraumatic.  Right Ear: Hearing, tympanic membrane, external ear and ear canal normal. No drainage, swelling or tenderness. Tympanic membrane is not injected, not erythematous and not bulging. No middle ear effusion. No decreased hearing is noted.  Left Ear: Hearing, tympanic membrane, external ear and ear canal normal. No drainage, swelling or tenderness. Tympanic membrane is not injected, not erythematous and not bulging.  No middle ear effusion. No decreased hearing is noted.  Nose: Nose normal. Right sinus exhibits no maxillary sinus tenderness and no frontal sinus tenderness. Left sinus exhibits no maxillary sinus tenderness  and no frontal sinus tenderness.  Mouth/Throat: Uvula is midline, oropharynx is clear and moist and mucous membranes are normal. No oropharyngeal exudate, posterior oropharyngeal edema, posterior oropharyngeal erythema or tonsillar abscesses.  Eyes: Conjunctivae are normal.  Cardiovascular: Regular rhythm and normal heart sounds.   Pulmonary/Chest: Effort normal and breath sounds normal. No respiratory distress. He has no wheezes. He has no rhonchi. He has no rales.  Lymphadenopathy:       Head (right side): No submental, no submandibular, no tonsillar, no preauricular, no posterior  auricular and no occipital adenopathy present.       Head (left side): No submental, no submandibular, no tonsillar, no preauricular, no posterior auricular and no occipital adenopathy present.    He has no cervical adenopathy.  Neurological: He is alert.  Skin: Skin is warm and dry.  Psychiatric: He has a normal mood and affect. His speech is normal and behavior is normal.  Vitals reviewed.      Assessment & Plan:  1. Cough/ Sinus Congestion Viral v bacterial URI x 5 days. On amoxicillin currently and we jointly agreed since symptoms have improved, we will continue with antibiotic. If symptoms do not resolve on antibiotic, Ms Waymack will let me know and I will change the antibiotic. We decided to stop sudafed and start mucinex.   In regards to Mr. Malicoat's concern pituitary tumor due to his headache, we jointly decided that the headache was most likely from sinus congestion and if that if is not improved with treatment this week, he will let me know and we will order MRI head.     I have discontinued Mr. Pendell's levothyroxine and HYDROcodone-homatropine. I am also having him maintain his multivitamin, nicotine polacrilex, cetirizine, DULoxetine, clomiPHENE, and Thyroid.   Meds ordered this encounter  Medications  . clomiPHENE (CLOMID) 50 MG tablet    Sig: Take 50 mg by mouth. 50 mg M,W,F and 25 mg T,Th,S,Su  . Thyroid (NATURE-THROID) 113.75 MG TABS    Sig: Take 113.75 mg by mouth daily.     Start medications as prescribed and explained to patient on After Visit Summary ( AVS). Risks, benefits, and alternatives of the medications and treatment plan prescribed today were discussed, and patient expressed understanding.   Education regarding symptom management and diagnosis given to patient.   Follow-up:Plan follow-up and return precautions given if any worsening symptoms or change in condition.   Continue to follow with Scarlette Calico, MD for routine health maintenance.    Darryl Nestle and I agreed with plan.   Mable Paris, FNP

## 2015-07-03 NOTE — Progress Notes (Signed)
Pre visit review using our clinic review tool, if applicable. No additional management support is needed unless otherwise documented below in the visit note. 

## 2015-07-06 ENCOUNTER — Telehealth: Payer: Self-pay

## 2015-07-06 DIAGNOSIS — R059 Cough, unspecified: Secondary | ICD-10-CM

## 2015-07-06 DIAGNOSIS — R05 Cough: Secondary | ICD-10-CM

## 2015-07-06 MED ORDER — AZITHROMYCIN 250 MG PO TABS: ORAL_TABLET | ORAL | Status: DC

## 2015-07-06 NOTE — Telephone Encounter (Signed)
Please call patient and let him know I have sent prescription for azithromycin.

## 2015-07-06 NOTE — Telephone Encounter (Signed)
Patients states he is not feeling 100% better. Stilling coughing stuff up, and alot chest congestion. Patient states he doesn't know if he needs another abx or what. Can you please call patient back when you have a chance.

## 2015-07-06 NOTE — Telephone Encounter (Signed)
Left detailed message for pt regarding abx rx sent to pharmacy.

## 2015-07-12 ENCOUNTER — Ambulatory Visit: Payer: BLUE CROSS/BLUE SHIELD | Admitting: Internal Medicine

## 2015-07-13 DIAGNOSIS — E291 Testicular hypofunction: Secondary | ICD-10-CM | POA: Diagnosis not present

## 2015-07-13 DIAGNOSIS — E039 Hypothyroidism, unspecified: Secondary | ICD-10-CM | POA: Diagnosis not present

## 2015-07-13 DIAGNOSIS — F39 Unspecified mood [affective] disorder: Secondary | ICD-10-CM | POA: Diagnosis not present

## 2015-07-13 DIAGNOSIS — F17200 Nicotine dependence, unspecified, uncomplicated: Secondary | ICD-10-CM | POA: Diagnosis not present

## 2015-07-13 DIAGNOSIS — R5383 Other fatigue: Secondary | ICD-10-CM | POA: Diagnosis not present

## 2015-07-13 DIAGNOSIS — J309 Allergic rhinitis, unspecified: Secondary | ICD-10-CM | POA: Diagnosis not present

## 2015-09-18 DIAGNOSIS — Z23 Encounter for immunization: Secondary | ICD-10-CM | POA: Diagnosis not present

## 2015-09-21 ENCOUNTER — Encounter: Payer: Self-pay | Admitting: Student

## 2015-09-26 DIAGNOSIS — E039 Hypothyroidism, unspecified: Secondary | ICD-10-CM | POA: Diagnosis not present

## 2015-09-26 DIAGNOSIS — F39 Unspecified mood [affective] disorder: Secondary | ICD-10-CM | POA: Diagnosis not present

## 2015-09-26 DIAGNOSIS — F17200 Nicotine dependence, unspecified, uncomplicated: Secondary | ICD-10-CM | POA: Diagnosis not present

## 2015-09-26 DIAGNOSIS — E291 Testicular hypofunction: Secondary | ICD-10-CM | POA: Diagnosis not present

## 2015-10-18 DIAGNOSIS — J309 Allergic rhinitis, unspecified: Secondary | ICD-10-CM | POA: Diagnosis not present

## 2015-10-18 DIAGNOSIS — E039 Hypothyroidism, unspecified: Secondary | ICD-10-CM | POA: Diagnosis not present

## 2015-10-18 DIAGNOSIS — E291 Testicular hypofunction: Secondary | ICD-10-CM | POA: Diagnosis not present

## 2015-10-18 DIAGNOSIS — R5383 Other fatigue: Secondary | ICD-10-CM | POA: Diagnosis not present

## 2015-11-22 ENCOUNTER — Other Ambulatory Visit: Payer: Self-pay | Admitting: Internal Medicine

## 2015-11-22 ENCOUNTER — Encounter: Payer: Self-pay | Admitting: Internal Medicine

## 2015-11-22 DIAGNOSIS — Z0000 Encounter for general adult medical examination without abnormal findings: Secondary | ICD-10-CM | POA: Insufficient documentation

## 2015-12-04 ENCOUNTER — Other Ambulatory Visit (INDEPENDENT_AMBULATORY_CARE_PROVIDER_SITE_OTHER): Payer: BLUE CROSS/BLUE SHIELD

## 2015-12-04 ENCOUNTER — Other Ambulatory Visit: Payer: Self-pay | Admitting: Internal Medicine

## 2015-12-04 DIAGNOSIS — Z0000 Encounter for general adult medical examination without abnormal findings: Secondary | ICD-10-CM

## 2015-12-04 LAB — COMPREHENSIVE METABOLIC PANEL
ALBUMIN: 4.4 g/dL (ref 3.5–5.2)
ALK PHOS: 47 U/L (ref 39–117)
ALT: 17 U/L (ref 0–53)
AST: 22 U/L (ref 0–37)
BILIRUBIN TOTAL: 0.6 mg/dL (ref 0.2–1.2)
BUN: 19 mg/dL (ref 6–23)
CALCIUM: 9.5 mg/dL (ref 8.4–10.5)
CO2: 32 mEq/L (ref 19–32)
Chloride: 104 mEq/L (ref 96–112)
Creatinine, Ser: 1.02 mg/dL (ref 0.40–1.50)
GFR: 88.07 mL/min (ref >60.00)
Glucose, Bld: 64 mg/dL — ABNORMAL LOW (ref 70–99)
POTASSIUM: 3.9 meq/L (ref 3.5–5.1)
Sodium: 142 mEq/L (ref 135–145)
TOTAL PROTEIN: 6.9 g/dL (ref 6.0–8.3)

## 2015-12-04 LAB — LIPID PANEL
CHOLESTEROL: 157 mg/dL (ref 0–200)
HDL: 58.3 mg/dL (ref >39.00)
LDL Cholesterol: 86 mg/dL (ref 0–99)
NonHDL: 99.13
Total CHOL/HDL Ratio: 3
Triglycerides: 67 mg/dL (ref 0.0–149.0)
VLDL: 13.4 mg/dL (ref 0.0–40.0)

## 2015-12-04 LAB — CBC WITH DIFFERENTIAL/PLATELET
Basophils Absolute: 0 10*3/uL (ref 0.0–0.1)
Basophils Relative: 0.3 % (ref 0.0–3.0)
EOS PCT: 1.8 % (ref 0.0–5.0)
Eosinophils Absolute: 0.1 10*3/uL (ref 0.0–0.7)
HEMATOCRIT: 47.4 % (ref 39.0–52.0)
HEMOGLOBIN: 16 g/dL (ref 13.0–17.0)
LYMPHS ABS: 3.1 10*3/uL (ref 0.7–4.0)
LYMPHS PCT: 40.6 % (ref 12.0–46.0)
MCHC: 33.8 g/dL (ref 30.0–36.0)
MCV: 91.7 fl (ref 78.0–100.0)
MONOS PCT: 14.2 % — AB (ref 3.0–12.0)
Monocytes Absolute: 1.1 10*3/uL — ABNORMAL HIGH (ref 0.1–1.0)
Neutro Abs: 3.3 10*3/uL (ref 1.4–7.7)
Neutrophils Relative %: 43.1 % (ref 43.0–77.0)
Platelets: 224 10*3/uL (ref 150.0–400.0)
RBC: 5.16 Mil/uL (ref 4.22–5.81)
RDW: 12.6 % (ref 11.5–15.5)
WBC: 7.6 10*3/uL (ref 4.0–10.5)

## 2015-12-04 LAB — TSH: TSH: 0.16 u[IU]/mL — AB (ref 0.35–4.50)

## 2015-12-17 ENCOUNTER — Encounter: Payer: Self-pay | Admitting: Internal Medicine

## 2015-12-18 DIAGNOSIS — F39 Unspecified mood [affective] disorder: Secondary | ICD-10-CM | POA: Diagnosis not present

## 2015-12-18 DIAGNOSIS — E559 Vitamin D deficiency, unspecified: Secondary | ICD-10-CM | POA: Diagnosis not present

## 2015-12-18 DIAGNOSIS — E291 Testicular hypofunction: Secondary | ICD-10-CM | POA: Diagnosis not present

## 2015-12-18 DIAGNOSIS — F17200 Nicotine dependence, unspecified, uncomplicated: Secondary | ICD-10-CM | POA: Diagnosis not present

## 2015-12-18 DIAGNOSIS — E039 Hypothyroidism, unspecified: Secondary | ICD-10-CM | POA: Diagnosis not present

## 2015-12-27 ENCOUNTER — Encounter: Payer: Self-pay | Admitting: Internal Medicine

## 2015-12-27 ENCOUNTER — Ambulatory Visit (INDEPENDENT_AMBULATORY_CARE_PROVIDER_SITE_OTHER): Payer: BLUE CROSS/BLUE SHIELD | Admitting: Internal Medicine

## 2015-12-27 VITALS — BP 130/70 | HR 78 | Temp 97.9°F | Resp 16 | Ht 70.0 in | Wt 188.0 lb

## 2015-12-27 DIAGNOSIS — E039 Hypothyroidism, unspecified: Secondary | ICD-10-CM

## 2015-12-27 DIAGNOSIS — Z0000 Encounter for general adult medical examination without abnormal findings: Secondary | ICD-10-CM

## 2015-12-27 NOTE — Patient Instructions (Signed)

## 2015-12-27 NOTE — Progress Notes (Signed)
Subjective:  Patient ID: Jacob [name], male    DOB: Jan 18, 1981  Age: 35 y.o. MRN: [id number]  CC: Annual Exam and Hypothyroidism   HPI TOREN SASSER presents for a CPX.  He feels well and offers no complaints. He recently had lab work done and the only remarkable element was a suppressed TSH. His thyroid supplementation as prescribed by someone else at a integrative health care practice in [place].  Outpatient Medications Prior to Visit  Medication Sig Dispense Refill  . cetirizine (ZYRTEC) 10 MG tablet Take 10 mg by mouth daily.      . clomiPHENE (CLOMID) 50 MG tablet Take 50 mg by mouth. 50 mg M,W,F and 25 mg T,Th,S,Su    . DULoxetine (CYMBALTA) 30 MG capsule Take 1 capsule (30 mg total) by mouth daily. 90 capsule 1  . nicotine polacrilex (COMMIT) 2 MG lozenge Place 2 mg inside cheek daily.      . Thyroid (NATURE-THROID) 113.75 MG TABS Take 113.75 mg by mouth daily.     No facility-administered medications prior to visit.     ROS Review of Systems  Constitutional: Negative for activity change, appetite change, diaphoresis, fatigue and unexpected weight change.  HENT: Negative.   Eyes: Negative for visual disturbance.  Respiratory: Negative for cough, chest tightness, shortness of breath and stridor.   Cardiovascular: Negative for chest pain, palpitations and leg swelling.  Gastrointestinal: Negative.  Negative for abdominal pain, constipation, nausea and vomiting.  Genitourinary: Negative.  Negative for difficulty urinating, dysuria, hematuria, scrotal swelling, testicular pain and urgency.  Musculoskeletal: Negative.  Negative for back pain and myalgias.  Skin: Negative.   Neurological: Negative.  Negative for dizziness, weakness, light-headedness and headaches.  Hematological: Negative.  Negative for adenopathy. Does not bruise/bleed easily.  Psychiatric/Behavioral: Negative.     Objective:  BP 130/70 (BP Location: Left Arm, Patient Position: Sitting,  Cuff Size: Normal)   Pulse 78   Temp 97.9 F (36.6 C) (Oral)   Resp 16   Ht 5\' 10"  (1.778 m)   Wt 188 lb (85.3 kg)   SpO2 97%   BMI 26.98 kg/m   BP Readings from Last 3 Encounters:  12/27/15 130/70  07/03/15 130/80  02/09/15 98/70    Wt Readings from Last 3 Encounters:  12/27/15 188 lb (85.3 kg)  07/03/15 184 lb (83.5 kg)  02/09/15 183 lb (83 kg)    Physical Exam  Constitutional: He is oriented to person, place, and time. No distress.  HENT:  Mouth/Throat: Oropharynx is clear and moist. No oropharyngeal exudate.  Eyes: Conjunctivae are normal. Right eye exhibits no discharge. Left eye exhibits no discharge. No scleral icterus.  Neck: Normal range of motion. Neck supple. No JVD present. No tracheal deviation present. No thyromegaly present.  Cardiovascular: Normal rate, regular rhythm, normal heart sounds and intact distal pulses.  Exam reveals no gallop and no friction rub.   No murmur heard. Pulmonary/Chest: Effort normal and breath sounds normal. No stridor. No respiratory distress. He has no wheezes. He has no rales. He exhibits no tenderness.  Abdominal: Soft. Bowel sounds are normal. He exhibits no distension and no mass. There is no tenderness. There is no rebound and no guarding. Hernia confirmed negative in the right inguinal area and confirmed negative in the left inguinal area.  Genitourinary: Testes normal and penis normal. Right testis shows no mass, no swelling and no tenderness. Right testis is descended. Left testis shows no mass, no swelling and no tenderness. Left testis is descended.  Circumcised. No penile erythema or penile tenderness. No discharge found.  Musculoskeletal: Normal range of motion. He exhibits no edema, tenderness or deformity.  Lymphadenopathy:    He has no cervical adenopathy.       Right: No inguinal adenopathy present.       Left: No inguinal adenopathy present.  Neurological: He is oriented to person, place, and time.  Skin: Skin is warm  and dry. No rash noted. He is not diaphoretic. No erythema. No pallor.  Psychiatric: He has a normal mood and affect. His behavior is normal. Judgment and thought content normal.  Vitals reviewed.   Lab Results  Component Value Date   WBC 7.6 12/04/2015   HGB 16.0 12/04/2015   HCT 47.4 12/04/2015   PLT 224.0 12/04/2015   GLUCOSE 64 (L) 12/04/2015   CHOL 157 12/04/2015   TRIG 67.0 12/04/2015   HDL 58.30 12/04/2015   LDLCALC 86 12/04/2015   ALT 17 12/04/2015   AST 22 12/04/2015   NA 142 12/04/2015   K 3.9 12/04/2015   CL 104 12/04/2015   CREATININE 1.02 12/04/2015   BUN 19 12/04/2015   CO2 32 12/04/2015   TSH 0.16 (L) 12/04/2015    Dg Chest 2 View  Result Date: 02/09/2015 CLINICAL DATA:  Cough, fatigue for 3 days, ex-smoker EXAM: CHEST  2 VIEW COMPARISON:  None. FINDINGS: Cardiomediastinal silhouette is unremarkable. No acute infiltrate or pleural effusion. No pulmonary edema. Bony thorax is unremarkable. IMPRESSION: No active cardiopulmonary disease. Electronically Signed   By: Lahoma Crocker M.D.   On: 02/09/2015 14:00    Assessment & Plan:   Shazil was seen today for annual exam and hypothyroidism.  Diagnoses and all orders for this visit:  Acquired hypothyroidism- I informed him that his thyroid replacement dose is too high, he is seeing his integrative physician next week and will discuss a dose decrease with that doctor.  Routine general medical examination at a health care facility- exam completed, labs ordered and reviewed, vaccines reviewed and updated, patient education material was given.   I am having Mr. Grandville Silos maintain his nicotine polacrilex, cetirizine, DULoxetine, clomiPHENE, and Thyroid.  No orders of the defined types were placed in this encounter.    Follow-up: Return if symptoms worsen or fail to improve.  Scarlette Calico, MD

## 2015-12-27 NOTE — Progress Notes (Signed)
Pre visit review using our clinic review tool, if applicable. No additional management support is needed unless otherwise documented below in the visit note. 

## 2016-01-03 DIAGNOSIS — E349 Endocrine disorder, unspecified: Secondary | ICD-10-CM | POA: Diagnosis not present

## 2016-01-03 DIAGNOSIS — R5383 Other fatigue: Secondary | ICD-10-CM | POA: Diagnosis not present

## 2016-01-03 DIAGNOSIS — E039 Hypothyroidism, unspecified: Secondary | ICD-10-CM | POA: Diagnosis not present

## 2016-01-03 DIAGNOSIS — J309 Allergic rhinitis, unspecified: Secondary | ICD-10-CM | POA: Diagnosis not present

## 2016-02-05 ENCOUNTER — Encounter: Payer: Self-pay | Admitting: Internal Medicine

## 2016-02-05 ENCOUNTER — Ambulatory Visit (INDEPENDENT_AMBULATORY_CARE_PROVIDER_SITE_OTHER): Payer: BLUE CROSS/BLUE SHIELD | Admitting: Internal Medicine

## 2016-02-05 VITALS — BP 130/62 | HR 80 | Temp 98.0°F | Resp 16 | Ht 70.0 in | Wt 188.0 lb

## 2016-02-05 DIAGNOSIS — J0100 Acute maxillary sinusitis, unspecified: Secondary | ICD-10-CM

## 2016-02-05 DIAGNOSIS — B070 Plantar wart: Secondary | ICD-10-CM | POA: Diagnosis not present

## 2016-02-05 MED ORDER — CEFDINIR 300 MG PO CAPS: 300.0000 mg | ORAL_CAPSULE | Freq: Two times a day (BID) | ORAL | 0 refills | Status: AC

## 2016-02-05 NOTE — Patient Instructions (Signed)

## 2016-02-05 NOTE — Progress Notes (Signed)
Pre visit review using our clinic review tool, if applicable. No additional management support is needed unless otherwise documented below in the visit note. 

## 2016-02-05 NOTE — Progress Notes (Signed)
Subjective:  Patient ID: Jacob [name], male    DOB: 11/08/1980  Age: 36 y.o. MRN: [id number]  CC: Sinusitis   HPI Jacob [name] presents for a 2 week history of sinus pain, thick yellow/green nasal phlegm, postnasal drip and chills but no fever, sore throat, lymphadenopathy, or cough.  He also complains of a wart on the bottom of his right foot. He has tried to treat it with multiple over-the-counter medications but it will not resolve.  Outpatient Medications Prior to Visit  Medication Sig Dispense Refill  . cetirizine (ZYRTEC) 10 MG tablet Take 10 mg by mouth daily.      . clomiPHENE (CLOMID) 50 MG tablet Take 50 mg by mouth. 50 mg M,W,F and 25 mg T,Th,S,Su    . DULoxetine (CYMBALTA) 30 MG capsule Take 1 capsule (30 mg total) by mouth daily. 90 capsule 1  . nicotine polacrilex (COMMIT) 2 MG lozenge Place 2 mg inside cheek daily.      . Thyroid (NATURE-THROID) 113.75 MG TABS Take 113.75 mg by mouth daily.     No facility-administered medications prior to visit.     ROS Review of Systems  Constitutional: Positive for chills. Negative for fatigue, fever and unexpected weight change.  HENT: Positive for congestion, postnasal drip, rhinorrhea, sinus pain and sinus pressure. Negative for facial swelling, sneezing, sore throat and trouble swallowing.   Eyes: Negative.   Respiratory: Negative.  Negative for choking, shortness of breath and wheezing.   Cardiovascular: Negative.  Negative for chest pain, palpitations and leg swelling.  Gastrointestinal: Negative for abdominal pain, constipation, diarrhea, nausea and vomiting.  Endocrine: Negative.   Genitourinary: Negative.  Negative for difficulty urinating.  Musculoskeletal: Negative.  Negative for back pain and myalgias.  Skin: Negative.  Negative for color change and rash.  Allergic/Immunologic: Negative.   Neurological: Negative.   Hematological: Negative for adenopathy. Does not bruise/bleed easily.    Psychiatric/Behavioral: Negative.     Objective:  BP 130/62   Pulse 80   Temp 98 F (36.7 C) (Oral)   Resp 16   Ht 5\' 10"  (1.778 m)   Wt 188 lb (85.3 kg)   SpO2 98%   BMI 26.98 kg/m   BP Readings from Last 3 Encounters:  02/05/16 130/62  12/27/15 130/70  07/03/15 130/80    Wt Readings from Last 3 Encounters:  02/05/16 188 lb (85.3 kg)  12/27/15 188 lb (85.3 kg)  07/03/15 184 lb (83.5 kg)    Physical Exam  Constitutional: He is oriented to person, place, and time. No distress.  HENT:  Right Ear: Hearing, tympanic membrane, external ear and ear canal normal.  Left Ear: Hearing, tympanic membrane, external ear and ear canal normal.  Nose: Rhinorrhea present. No mucosal edema or sinus tenderness. No epistaxis. Right sinus exhibits maxillary sinus tenderness. Right sinus exhibits no frontal sinus tenderness. Left sinus exhibits maxillary sinus tenderness. Left sinus exhibits no frontal sinus tenderness.  Mouth/Throat: Oropharynx is clear and moist and mucous membranes are normal. Mucous membranes are not pale, not dry and not cyanotic. No oral lesions. No trismus in the jaw. No uvula swelling. No oropharyngeal exudate, posterior oropharyngeal edema, posterior oropharyngeal erythema or tonsillar abscesses.  Eyes: Conjunctivae are normal. Right eye exhibits no discharge. Left eye exhibits no discharge. No scleral icterus.  Neck: Normal range of motion. Neck supple. No JVD present. No tracheal deviation present. No thyromegaly present.  Cardiovascular: Normal rate, regular rhythm, normal heart sounds and intact distal pulses.  Exam reveals  no gallop and no friction rub.   No murmur heard. Pulmonary/Chest: Effort normal and breath sounds normal. No stridor. No respiratory distress. He has no wheezes. He has no rales. He exhibits no tenderness.  Abdominal: Soft. Bowel sounds are normal. He exhibits no distension and no mass. There is no tenderness. There is no rebound and no guarding.   Musculoskeletal: Normal range of motion. He exhibits no edema, tenderness or deformity.  Lymphadenopathy:    He has no cervical adenopathy.  Neurological: He is oriented to person, place, and time.  Skin: Skin is warm and dry. No rash noted. He is not diaphoretic. No erythema. No pallor.  Plantar side of right foot over the first metatarsal pad reveals a 6 mm verrucous lesion. The lesion was treated with cryotherapy, he received 3 cycles of 22nd freeze and thaw. He tolerated it well.  Psychiatric: He has a normal mood and affect. His behavior is normal. Judgment and thought content normal.  Vitals reviewed.   Lab Results  Component Value Date   WBC 7.6 12/04/2015   HGB 16.0 12/04/2015   HCT 47.4 12/04/2015   PLT 224.0 12/04/2015   GLUCOSE 64 (L) 12/04/2015   CHOL 157 12/04/2015   TRIG 67.0 12/04/2015   HDL 58.30 12/04/2015   LDLCALC 86 12/04/2015   ALT 17 12/04/2015   AST 22 12/04/2015   NA 142 12/04/2015   K 3.9 12/04/2015   CL 104 12/04/2015   CREATININE 1.02 12/04/2015   BUN 19 12/04/2015   CO2 32 12/04/2015   TSH 0.16 (L) 12/04/2015    Dg Chest 2 View  Result Date: 02/09/2015 CLINICAL DATA:  Cough, fatigue for 3 days, ex-smoker EXAM: CHEST  2 VIEW COMPARISON:  None. FINDINGS: Cardiomediastinal silhouette is unremarkable. No acute infiltrate or pleural effusion. No pulmonary edema. Bony thorax is unremarkable. IMPRESSION: No active cardiopulmonary disease. Electronically Signed   By: Jacob [name] M.D.   On: 02/09/2015 14:00    Assessment & Plan:   Jacob [name] was seen today for sinusitis.  Diagnoses and all orders for this visit:  Plantar wart, right foot- cryotherapy successfully applied, I've asked him to return in 3 weeks to consider another round of treatment  Acute non-recurrent maxillary sinusitis -     cefdinir (OMNICEF) 300 MG capsule; Take 1 capsule (300 mg total) by mouth 2 (two) times daily.   I have discontinued Jacob [name]'s nicotine polacrilex. I am  also having him start on cefdinir. Additionally, I am having him maintain his cetirizine, DULoxetine, clomiPHENE, Thyroid, and NATURE-THROID.  Meds ordered this encounter  Medications  . NATURE-THROID 32.5 MG tablet    Refill:  0  . cefdinir (OMNICEF) 300 MG capsule    Sig: Take 1 capsule (300 mg total) by mouth 2 (two) times daily.    Dispense:  20 capsule    Refill:  0     Follow-up: No Follow-up on file.  Scarlette Calico, MD

## 2016-02-11 ENCOUNTER — Encounter: Payer: Self-pay | Admitting: Internal Medicine

## 2016-02-12 ENCOUNTER — Ambulatory Visit (INDEPENDENT_AMBULATORY_CARE_PROVIDER_SITE_OTHER): Payer: BLUE CROSS/BLUE SHIELD | Admitting: Internal Medicine

## 2016-02-12 ENCOUNTER — Encounter: Payer: Self-pay | Admitting: Internal Medicine

## 2016-02-12 ENCOUNTER — Ambulatory Visit (INDEPENDENT_AMBULATORY_CARE_PROVIDER_SITE_OTHER)
Admission: RE | Admit: 2016-02-12 | Discharge: 2016-02-12 | Disposition: A | Discharge status: Home / Self Care | Payer: BLUE CROSS/BLUE SHIELD | Source: Ambulatory Visit | Attending: Internal Medicine | Admitting: Internal Medicine

## 2016-02-12 VITALS — BP 124/80 | HR 70 | Temp 98.6°F | Ht 70.0 in | Wt 189.0 lb

## 2016-02-12 DIAGNOSIS — J09X2 Influenza due to identified novel influenza A virus with other respiratory manifestations: Secondary | ICD-10-CM

## 2016-02-12 DIAGNOSIS — R05 Cough: Secondary | ICD-10-CM

## 2016-02-12 DIAGNOSIS — R059 Cough, unspecified: Secondary | ICD-10-CM

## 2016-02-12 LAB — POCT INFLUENZA A/B
Influenza A, POC: POSITIVE — AB
Influenza B, POC: NEGATIVE

## 2016-02-12 MED ORDER — OSELTAMIVIR PHOSPHATE 75 MG PO CAPS: 75.0000 mg | ORAL_CAPSULE | Freq: Two times a day (BID) | ORAL | 0 refills | Status: AC

## 2016-02-12 MED ORDER — HYDROCODONE-HOMATROPINE 5-1.5 MG/5ML PO SYRP: 5.0000 mL | ORAL_SOLUTION | Freq: Three times a day (TID) | ORAL | 0 refills | Status: DC | PRN

## 2016-02-12 NOTE — Progress Notes (Signed)
Pre visit review using our clinic review tool, if applicable. No additional management support is needed unless otherwise documented below in the visit note. 

## 2016-02-12 NOTE — Progress Notes (Signed)
Subjective:  Patient ID: Jacob [name], male    DOB: 1980/10/19  Age: 36 y.o. MRN: [id number]  CC: Cough   HPI Jacob [name] presents for a 2 day hx of severe NP cough that keeps him awake at night, fever, chills, HA and muscle aches.  Outpatient Medications Prior to Visit  Medication Sig Dispense Refill  . cefdinir (OMNICEF) 300 MG capsule Take 1 capsule (300 mg total) by mouth 2 (two) times daily. 20 capsule 0  . cetirizine (ZYRTEC) 10 MG tablet Take 10 mg by mouth daily.      . clomiPHENE (CLOMID) 50 MG tablet Take 50 mg by mouth. 50 mg M,W,F and 25 mg T,Th,S,Su    . DULoxetine (CYMBALTA) 30 MG capsule Take 1 capsule (30 mg total) by mouth daily. 90 capsule 1  . NATURE-THROID 32.5 MG tablet   0  . Thyroid (NATURE-THROID) 113.75 MG TABS Take 113.75 mg by mouth daily.     No facility-administered medications prior to visit.     ROS Review of Systems  Constitutional: Positive for chills, fatigue and fever. Negative for activity change, appetite change and unexpected weight change.  HENT: Negative.  Negative for congestion, sinus pressure and trouble swallowing.   Eyes: Negative for visual disturbance.  Respiratory: Positive for cough. Negative for choking, chest tightness, shortness of breath, wheezing and stridor.   Cardiovascular: Negative.  Negative for chest pain, palpitations and leg swelling.  Gastrointestinal: Negative for abdominal pain, constipation, diarrhea, nausea and vomiting.  Endocrine: Negative.   Genitourinary: Negative.  Negative for difficulty urinating and dysuria.  Musculoskeletal: Positive for myalgias. Negative for back pain and neck pain.  Skin: Negative.  Negative for color change, pallor and rash.  Allergic/Immunologic: Negative.   Neurological: Negative.  Negative for dizziness.  Hematological: Negative.  Negative for adenopathy. Does not bruise/bleed easily.  Psychiatric/Behavioral: Negative.     Objective:  BP 124/80 (BP Location: Left  Arm, Patient Position: Sitting, Cuff Size: Normal)   Pulse 70   Temp 98.6 F (37 C) (Oral)   Ht 5\' 10"  (1.778 m)   Wt 189 lb (85.7 kg)   SpO2 98%   BMI 27.12 kg/m   BP Readings from Last 3 Encounters:  02/12/16 124/80  02/05/16 130/62  12/27/15 130/70    Wt Readings from Last 3 Encounters:  02/12/16 189 lb (85.7 kg)  02/05/16 188 lb (85.3 kg)  12/27/15 188 lb (85.3 kg)    Physical Exam  Constitutional: He is oriented to person, place, and time.  Non-toxic appearance. He does not have a sickly appearance. He does not appear ill. No distress.  HENT:  Mouth/Throat: Oropharynx is clear and moist. Mucous membranes are not pale, not dry and not cyanotic. No trismus in the jaw. No uvula swelling. No oropharyngeal exudate, posterior oropharyngeal edema, posterior oropharyngeal erythema or tonsillar abscesses.  Eyes: Conjunctivae are normal. Right eye exhibits no discharge. Left eye exhibits no discharge. No scleral icterus.  Neck: Normal range of motion. Neck supple. No JVD present. No tracheal deviation present. No thyromegaly present.  Cardiovascular: Normal rate, regular rhythm, normal heart sounds and intact distal pulses.  Exam reveals no gallop and no friction rub.   No murmur heard. Pulmonary/Chest: Effort normal and breath sounds normal. No stridor. No respiratory distress. He has no wheezes. He has no rales. He exhibits no tenderness.  Abdominal: Soft. Bowel sounds are normal. He exhibits no distension and no mass. There is no tenderness. There is no rebound and no  guarding.  Musculoskeletal: Normal range of motion. He exhibits no edema, tenderness or deformity.  Lymphadenopathy:    He has no cervical adenopathy.  Neurological: He is oriented to person, place, and time.  Skin: Skin is warm and dry. No rash noted. He is not diaphoretic. No erythema. No pallor.  Psychiatric: He has a normal mood and affect. His behavior is normal. Judgment and thought content normal.  Vitals  reviewed.   Lab Results  Component Value Date   WBC 7.6 12/04/2015   HGB 16.0 12/04/2015   HCT 47.4 12/04/2015   PLT 224.0 12/04/2015   GLUCOSE 64 (L) 12/04/2015   CHOL 157 12/04/2015   TRIG 67.0 12/04/2015   HDL 58.30 12/04/2015   LDLCALC 86 12/04/2015   ALT 17 12/04/2015   AST 22 12/04/2015   NA 142 12/04/2015   K 3.9 12/04/2015   CL 104 12/04/2015   CREATININE 1.02 12/04/2015   BUN 19 12/04/2015   CO2 32 12/04/2015   TSH 0.16 (L) 12/04/2015    Dg Chest 2 View  Result Date: 02/12/2016 CLINICAL DATA:  Cough and congestion. EXAM: CHEST  2 VIEW COMPARISON:  02/09/2015. FINDINGS: Mediastinum and hilar structures normal. Lungs are clear. No pleural effusion or pneumothorax. IMPRESSION: No acute cardiopulmonary disease. Electronically Signed   By: Marcello Moores  Register   On: 02/12/2016 10:49    Assessment & Plan:   Jacob [name] was seen today for cough.  Diagnoses and all orders for this visit:  Cough- His signs and symptoms are consistent with a viral etiology and his chest x-ray is normal. His influenza screen is positive for influenza A. Will treat as listed below. -     DG Chest 2 View; Future -     POCT Influenza A/B -     HYDROcodone-homatropine (HYCODAN) 5-1.5 MG/5ML syrup; Take 5 mLs by mouth every 8 (eight) hours as needed for cough.  Influenza due to identified novel influenza A virus with other respiratory manifestations -     oseltamivir (TAMIFLU) 75 MG capsule; Take 1 capsule (75 mg total) by mouth 2 (two) times daily. -     HYDROcodone-homatropine (HYCODAN) 5-1.5 MG/5ML syrup; Take 5 mLs by mouth every 8 (eight) hours as needed for cough.   I am having Jacob [name] start on oseltamivir and HYDROcodone-homatropine. I am also having him maintain his cetirizine, DULoxetine, clomiPHENE, Thyroid, NATURE-THROID, and cefdinir.  Meds ordered this encounter  Medications  . oseltamivir (TAMIFLU) 75 MG capsule    Sig: Take 1 capsule (75 mg total) by mouth 2 (two) times daily.     Dispense:  10 capsule    Refill:  0  . HYDROcodone-homatropine (HYCODAN) 5-1.5 MG/5ML syrup    Sig: Take 5 mLs by mouth every 8 (eight) hours as needed for cough.    Dispense:  120 mL    Refill:  0     Follow-up: Return if symptoms worsen or fail to improve.  Scarlette Calico, MD

## 2016-02-12 NOTE — Patient Instructions (Signed)

## 2016-02-12 NOTE — Telephone Encounter (Signed)
Pt is coming in now

## 2016-02-19 ENCOUNTER — Encounter: Payer: Self-pay | Admitting: Internal Medicine

## 2016-02-20 ENCOUNTER — Other Ambulatory Visit: Payer: Self-pay | Admitting: Internal Medicine

## 2016-02-20 DIAGNOSIS — J0100 Acute maxillary sinusitis, unspecified: Secondary | ICD-10-CM

## 2016-02-20 MED ORDER — AMOXICILLIN-POT CLAVULANATE 875-125 MG PO TABS: 1.0000 | ORAL_TABLET | Freq: Two times a day (BID) | ORAL | 0 refills | Status: AC

## 2016-03-15 DIAGNOSIS — J3489 Other specified disorders of nose and nasal sinuses: Secondary | ICD-10-CM | POA: Diagnosis not present

## 2016-03-15 DIAGNOSIS — J329 Chronic sinusitis, unspecified: Secondary | ICD-10-CM | POA: Diagnosis not present

## 2016-03-15 DIAGNOSIS — J301 Allergic rhinitis due to pollen: Secondary | ICD-10-CM | POA: Diagnosis not present

## 2016-03-19 ENCOUNTER — Encounter: Payer: Self-pay | Admitting: Internal Medicine

## 2016-03-19 ENCOUNTER — Ambulatory Visit (INDEPENDENT_AMBULATORY_CARE_PROVIDER_SITE_OTHER): Payer: BLUE CROSS/BLUE SHIELD | Admitting: Internal Medicine

## 2016-03-19 VITALS — BP 130/68 | HR 68 | Temp 97.8°F | Resp 16 | Ht 70.0 in | Wt 186.1 lb

## 2016-03-19 DIAGNOSIS — R1032 Left lower quadrant pain: Secondary | ICD-10-CM

## 2016-03-19 NOTE — Progress Notes (Signed)
Subjective:  Patient ID: Jacob [name], male    DOB: 22-Dec-1980  Age: 36 y.o. MRN: [id number]  CC: Abdominal Pain   HPI EOIN ANDRADA presents for concerns about intermittent, recurrent episodes of LLQ abd pain that started over 4 years ago. The pain occurs every few months and lasts about 1 week then spontaneously resolves. He rarely experiences nausea with the pain but does not vomit. He occasionally experiences constipation but never diarrhea.  Outpatient Medications Prior to Visit  Medication Sig Dispense Refill  . cetirizine (ZYRTEC) 10 MG tablet Take 10 mg by mouth daily.      . clomiPHENE (CLOMID) 50 MG tablet Take 50 mg by mouth. 50 mg M,W,F and 25 mg T,Th,S,Su    . DULoxetine (CYMBALTA) 30 MG capsule Take 1 capsule (30 mg total) by mouth daily. 90 capsule 1  . NATURE-THROID 32.5 MG tablet   0  . Thyroid (NATURE-THROID) 113.75 MG TABS Take 113.75 mg by mouth daily.     No facility-administered medications prior to visit.     ROS Review of Systems  Constitutional: Positive for unexpected weight change. Negative for activity change, appetite change, diaphoresis and fatigue.  HENT: Negative.  Negative for trouble swallowing and voice change.   Eyes: Negative.   Respiratory: Negative for cough, chest tightness, shortness of breath and wheezing.   Cardiovascular: Negative for chest pain, palpitations and leg swelling.  Gastrointestinal: Positive for abdominal pain and constipation. Negative for abdominal distention, blood in stool, diarrhea, nausea and vomiting.  Endocrine: Negative.   Genitourinary: Negative for difficulty urinating, dysuria, flank pain, frequency, hematuria, scrotal swelling, testicular pain and urgency.  Musculoskeletal: Negative.   Allergic/Immunologic: Negative.   Neurological: Negative.  Negative for dizziness, weakness and headaches.  Hematological: Negative.  Negative for adenopathy. Does not bruise/bleed easily.    Objective:  BP 130/68  (BP Location: Left Arm, Patient Position: Sitting, Cuff Size: Normal)   Pulse 68   Temp 97.8 F (36.6 C) (Oral)   Resp 16   Ht 5\' 10"  (1.778 m)   Wt 186 lb 1 oz (84.4 kg)   SpO2 98%   BMI 26.70 kg/m   BP Readings from Last 3 Encounters:  03/19/16 130/68  02/12/16 124/80  02/05/16 130/62    Wt Readings from Last 3 Encounters:  03/19/16 186 lb 1 oz (84.4 kg)  02/12/16 189 lb (85.7 kg)  02/05/16 188 lb (85.3 kg)    Physical Exam  Constitutional: No distress.  HENT:  Mouth/Throat: Oropharynx is clear and moist. No oropharyngeal exudate.  Eyes: Conjunctivae are normal. Right eye exhibits no discharge. Left eye exhibits no discharge. No scleral icterus.  Neck: Normal range of motion. Neck supple. No tracheal deviation present. No thyromegaly present.  Cardiovascular: Normal rate, regular rhythm, normal heart sounds and intact distal pulses.  Exam reveals no gallop and no friction rub.   No murmur heard. Pulmonary/Chest: Effort normal and breath sounds normal. No respiratory distress. He has no wheezes. He has no rales. He exhibits no tenderness.  Abdominal: Soft. Normal appearance and bowel sounds are normal. He exhibits no shifting dullness, no distension, no abdominal bruit and no mass. There is no hepatosplenomegaly. There is no tenderness. There is no rebound and no CVA tenderness. No hernia. Hernia confirmed negative in the ventral area, confirmed negative in the right inguinal area and confirmed negative in the left inguinal area.  Genitourinary: Testes normal and penis normal. Right testis shows no mass, no swelling and no tenderness. Right  testis is descended. Left testis shows no mass, no swelling and no tenderness. Left testis is descended. Circumcised. No penile erythema or penile tenderness. No discharge found.  Lymphadenopathy:    He has no cervical adenopathy.       Right: No inguinal adenopathy present.       Left: No inguinal adenopathy present.  Skin: He is not  diaphoretic.  Vitals reviewed.   Lab Results  Component Value Date   WBC 7.6 12/04/2015   HGB 16.0 12/04/2015   HCT 47.4 12/04/2015   PLT 224.0 12/04/2015   GLUCOSE 64 (L) 12/04/2015   CHOL 157 12/04/2015   TRIG 67.0 12/04/2015   HDL 58.30 12/04/2015   LDLCALC 86 12/04/2015   ALT 17 12/04/2015   AST 22 12/04/2015   NA 142 12/04/2015   K 3.9 12/04/2015   CL 104 12/04/2015   CREATININE 1.02 12/04/2015   BUN 19 12/04/2015   CO2 32 12/04/2015   TSH 0.16 (L) 12/04/2015    Dg Chest 2 View  Result Date: 02/12/2016 CLINICAL DATA:  Cough and congestion. EXAM: CHEST  2 VIEW COMPARISON:  02/09/2015. FINDINGS: Mediastinum and hilar structures normal. Lungs are clear. No pleural effusion or pneumothorax. IMPRESSION: No acute cardiopulmonary disease. Electronically Signed   By: Marcello Moores  Register   On: 02/12/2016 10:49    Assessment & Plan:   Dionisios was seen today for abdominal pain.  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 Shipp maintain his cetirizine, DULoxetine, clomiPHENE, Thyroid, and NATURE-THROID.  No orders of the defined types were placed in this encounter.    Follow-up: No Follow-up on file.  Scarlette Calico, MD

## 2016-03-19 NOTE — Progress Notes (Signed)
Pre visit review using our clinic review tool, if applicable. No additional management support is needed unless otherwise documented below in the visit note. 

## 2016-03-20 ENCOUNTER — Encounter: Payer: Self-pay | Admitting: Internal Medicine

## 2016-03-20 ENCOUNTER — Ambulatory Visit (INDEPENDENT_AMBULATORY_CARE_PROVIDER_SITE_OTHER)
Admission: RE | Admit: 2016-03-20 | Discharge: 2016-03-20 | Disposition: A | Discharge status: Home / Self Care | Payer: BLUE CROSS/BLUE SHIELD | Source: Ambulatory Visit | Attending: Internal Medicine | Admitting: Internal Medicine

## 2016-03-20 DIAGNOSIS — R1032 Left lower quadrant pain: Secondary | ICD-10-CM

## 2016-03-20 DIAGNOSIS — N2889 Other specified disorders of kidney and ureter: Secondary | ICD-10-CM | POA: Diagnosis not present

## 2016-03-20 MED ORDER — IOPAMIDOL (ISOVUE-300) INJECTION 61%
100.0000 mL | Freq: Once | INTRAVENOUS | Status: AC | PRN
  Administered 2016-03-20: 100 mL via INTRAVENOUS

## 2016-03-20 NOTE — Patient Instructions (Signed)

## 2016-03-22 DIAGNOSIS — E039 Hypothyroidism, unspecified: Secondary | ICD-10-CM | POA: Diagnosis not present

## 2016-03-22 DIAGNOSIS — F17200 Nicotine dependence, unspecified, uncomplicated: Secondary | ICD-10-CM | POA: Diagnosis not present

## 2016-03-22 DIAGNOSIS — E291 Testicular hypofunction: Secondary | ICD-10-CM | POA: Diagnosis not present

## 2016-03-22 DIAGNOSIS — E559 Vitamin D deficiency, unspecified: Secondary | ICD-10-CM | POA: Diagnosis not present

## 2016-03-22 DIAGNOSIS — J329 Chronic sinusitis, unspecified: Secondary | ICD-10-CM | POA: Diagnosis not present

## 2016-03-22 DIAGNOSIS — F39 Unspecified mood [affective] disorder: Secondary | ICD-10-CM | POA: Diagnosis not present

## 2016-03-24 ENCOUNTER — Other Ambulatory Visit: Payer: Self-pay | Admitting: Internal Medicine

## 2016-03-24 DIAGNOSIS — N281 Cyst of kidney, acquired: Secondary | ICD-10-CM

## 2016-04-10 DIAGNOSIS — J309 Allergic rhinitis, unspecified: Secondary | ICD-10-CM | POA: Diagnosis not present

## 2016-04-10 DIAGNOSIS — R5383 Other fatigue: Secondary | ICD-10-CM | POA: Diagnosis not present

## 2016-04-10 DIAGNOSIS — E039 Hypothyroidism, unspecified: Secondary | ICD-10-CM | POA: Diagnosis not present

## 2016-04-10 DIAGNOSIS — R7989 Other specified abnormal findings of blood chemistry: Secondary | ICD-10-CM | POA: Diagnosis not present

## 2016-04-16 ENCOUNTER — Encounter: Payer: Self-pay | Admitting: Internal Medicine

## 2016-04-16 ENCOUNTER — Ambulatory Visit (INDEPENDENT_AMBULATORY_CARE_PROVIDER_SITE_OTHER): Payer: BLUE CROSS/BLUE SHIELD | Admitting: Internal Medicine

## 2016-04-16 VITALS — BP 122/82 | HR 89 | Temp 98.3°F | Resp 16 | Ht 70.0 in | Wt 187.0 lb

## 2016-04-16 DIAGNOSIS — B070 Plantar wart: Secondary | ICD-10-CM

## 2016-04-16 NOTE — Progress Notes (Signed)
Pre visit review using our clinic review tool, if applicable. No additional management support is needed unless otherwise documented below in the visit note. 

## 2016-04-17 ENCOUNTER — Ambulatory Visit
Admission: RE | Admit: 2016-04-17 | Discharge: 2016-04-17 | Disposition: A | Discharge status: Home / Self Care | Payer: BLUE CROSS/BLUE SHIELD | Source: Ambulatory Visit | Attending: Internal Medicine | Admitting: Internal Medicine

## 2016-04-17 ENCOUNTER — Encounter: Payer: Self-pay | Admitting: Internal Medicine

## 2016-04-17 DIAGNOSIS — N281 Cyst of kidney, acquired: Secondary | ICD-10-CM | POA: Diagnosis not present

## 2016-04-21 NOTE — Progress Notes (Signed)
   Subjective:  Patient ID: Jacob [name], male    DOB: December 07, 1980  Age: 36 y.o. MRN: [id number]  CC: Plantar Warts   HPI JAMEN LOISEAU presents for Cryotherapy to treat a wart on the plantar side of his right foot. The warts are smaller and are down to 1 since his previous treatment with cryotherapy.  Outpatient Medications Prior to Visit  Medication Sig Dispense Refill  . cetirizine (ZYRTEC) 10 MG tablet Take 10 mg by mouth daily.      . clomiPHENE (CLOMID) 50 MG tablet Take 50 mg by mouth. 50 mg M,W,F and 25 mg T,Th,S,Su    . DULoxetine (CYMBALTA) 30 MG capsule Take 1 capsule (30 mg total) by mouth daily. 90 capsule 1  . NATURE-THROID 32.5 MG tablet   0  . Thyroid (NATURE-THROID) 113.75 MG TABS Take 113.75 mg by mouth daily.     No facility-administered medications prior to visit.     ROS Review of Systems  All other systems reviewed and are negative.   Objective:  BP 122/82 (BP Location: Left Arm, Patient Position: Sitting, Cuff Size: Normal)   Pulse 89   Temp 98.3 F (36.8 C) (Oral)   Resp 16   Ht 5\' 10"  (1.778 m)   Wt 187 lb (84.8 kg)   SpO2 98%   BMI 26.83 kg/m   BP Readings from Last 3 Encounters:  04/16/16 122/82  03/19/16 130/68  02/12/16 124/80    Wt Readings from Last 3 Encounters:  04/16/16 187 lb (84.8 kg)  03/19/16 186 lb 1 oz (84.4 kg)  02/12/16 189 lb (85.7 kg)    Physical Exam  Musculoskeletal:       Feet:    Lab Results  Component Value Date   WBC 7.6 12/04/2015   HGB 16.0 12/04/2015   HCT 47.4 12/04/2015   PLT 224.0 12/04/2015   GLUCOSE 64 (L) 12/04/2015   CHOL 157 12/04/2015   TRIG 67.0 12/04/2015   HDL 58.30 12/04/2015   LDLCALC 86 12/04/2015   ALT 17 12/04/2015   AST 22 12/04/2015   NA 142 12/04/2015   K 3.9 12/04/2015   CL 104 12/04/2015   CREATININE 1.02 12/04/2015   BUN 19 12/04/2015   CO2 32 12/04/2015   TSH 0.16 (L) 12/04/2015    US Renal  Result Date: 04/17/2016 CLINICAL DATA:  Renal cyst. EXAM:  RENAL / URINARY TRACT ULTRASOUND COMPLETE COMPARISON:  CT 03/20/2016 . FINDINGS: Right Kidney: Length: 10.7 cm. Echogenicity within normal limits. No mass or hydronephrosis visualized. Left Kidney: Length: 11.9 cm. Echogenicity within normal limits. No hydronephrosis visualized. Simple cyst noted measuring 1.1 cm. This most likely corresponds previously identified CT abnormality. Bladder: Appears normal for degree of bladder distention. IMPRESSION: 1.  1.1 cm simple cyst left kidney. 2. Exam otherwise unremarkable. Electronically Signed   By: Marcello Moores  Register   On: 04/17/2016 14:38    Assessment & Plan:   Brendon was seen today for plantar warts.  Diagnoses and all orders for this visit:  Plantar wart of right foot   I am having Mr. Pollitt maintain his cetirizine, DULoxetine, clomiPHENE, Thyroid, and NATURE-THROID.  No orders of the defined types were placed in this encounter.    Follow-up: No Follow-up on file.  Scarlette Calico, MD

## 2016-04-26 DIAGNOSIS — J3489 Other specified disorders of nose and nasal sinuses: Secondary | ICD-10-CM | POA: Diagnosis not present

## 2016-04-26 DIAGNOSIS — J301 Allergic rhinitis due to pollen: Secondary | ICD-10-CM | POA: Diagnosis not present

## 2016-05-08 ENCOUNTER — Encounter: Payer: Self-pay | Admitting: Internal Medicine

## 2016-05-08 ENCOUNTER — Ambulatory Visit: Payer: Self-pay | Admitting: Internal Medicine

## 2016-05-28 ENCOUNTER — Other Ambulatory Visit: Payer: Self-pay | Admitting: Internal Medicine

## 2016-05-28 ENCOUNTER — Telehealth: Payer: Self-pay | Admitting: Internal Medicine

## 2016-05-28 DIAGNOSIS — B070 Plantar wart: Secondary | ICD-10-CM

## 2016-05-28 NOTE — Telephone Encounter (Signed)
Pt has a questions about the wart he keeps coming into see Dr Waynard Reeds about.  He would like to know If Dr Ronnald Ramp just needs to refer to him to a dermatologist ??    Best number [phone]

## 2016-05-28 NOTE — Telephone Encounter (Signed)
Podiatry referral ordered 

## 2016-05-29 NOTE — Telephone Encounter (Signed)
Informed patient of referral. 

## 2016-06-10 ENCOUNTER — Encounter: Payer: Self-pay | Admitting: Podiatry

## 2016-06-10 ENCOUNTER — Ambulatory Visit (INDEPENDENT_AMBULATORY_CARE_PROVIDER_SITE_OTHER): Payer: BLUE CROSS/BLUE SHIELD | Admitting: Podiatry

## 2016-06-10 VITALS — BP 138/85 | HR 86

## 2016-06-10 DIAGNOSIS — B070 Plantar wart: Secondary | ICD-10-CM

## 2016-06-10 NOTE — Progress Notes (Signed)
   Subjective:    Patient ID: Jacob [name], male    DOB: May 10, 1980, 36 y.o.   MRN: [id number]  HPI 36 year old male presents the also concerns of a painful callus, wart to the bottom of his right foot which is been ongoing for about 1 year. He states that he's had the area present to times has been using over-the-counter treatment at a improvement. The area he states it is more annoying as opposed to painful however he does notice the area daily. Denies any drainage or pus or any swelling to the area.   Review of Systems  All other systems reviewed and are negative.      Objective:   Physical Exam General: AAO x3, NAD  Dermatological: On the right foot submetatarsal one is a hyperkeratotic lesion. Upon debridement there is evidence of verruca. No evidence of foreign body. No surrounding erythema, ascending synovitis. There is no drainage or pus or clinical signs of infection.  Vascular: Dorsalis Pedis artery and Posterior Tibial artery pedal pulses are 2/4 bilateral with immedate capillary fill time.  There is no pain with calf compression, swelling, warmth, erythema.   Neruologic: Grossly intact via light touch bilateral. Vibratory intact via tuning fork bilateral. Protective threshold with Semmes Wienstein monofilament intact to all pedal sites bilateral.   Musculoskeletal: No gross boney pedal deformities bilateral. No pain, crepitus, or limitation noted with foot and ankle range of motion bilateral. Muscular strength 5/5 in all groups tested bilateral.  Gait: Unassisted, Nonantalgic.      Assessment & Plan:  36 year old male right submetatarsal 1 verruca -Treatment options discussed including all alternatives, risks, and complications -Etiology of symptoms were discussed -Lesion sharply debrided today without complications. The area was cleaned in canthrone was applied followed by an occlusive bandage. Post procedure instructions were discussed. Monitor for infection.  Discussed excision of the lesion in the future but given location would prefer to hold off on this for now.  -RTC 3 weeks or sooner if needed.   Celesta Gentile, DPM

## 2016-07-02 DIAGNOSIS — E291 Testicular hypofunction: Secondary | ICD-10-CM | POA: Diagnosis not present

## 2016-07-02 DIAGNOSIS — E039 Hypothyroidism, unspecified: Secondary | ICD-10-CM | POA: Diagnosis not present

## 2016-07-02 DIAGNOSIS — F39 Unspecified mood [affective] disorder: Secondary | ICD-10-CM | POA: Diagnosis not present

## 2016-07-02 DIAGNOSIS — F17200 Nicotine dependence, unspecified, uncomplicated: Secondary | ICD-10-CM | POA: Diagnosis not present

## 2016-07-02 DIAGNOSIS — E559 Vitamin D deficiency, unspecified: Secondary | ICD-10-CM | POA: Diagnosis not present

## 2016-07-04 ENCOUNTER — Ambulatory Visit (INDEPENDENT_AMBULATORY_CARE_PROVIDER_SITE_OTHER): Payer: BLUE CROSS/BLUE SHIELD | Admitting: Podiatry

## 2016-07-04 ENCOUNTER — Encounter: Payer: Self-pay | Admitting: Podiatry

## 2016-07-04 DIAGNOSIS — B070 Plantar wart: Secondary | ICD-10-CM

## 2016-07-07 NOTE — Progress Notes (Signed)
Subjective: 36 year old male presents to the office today for follow-up Evaluation of a wart to the right foot along submetatarsal one. He states that the areas about the same has not changed much. Denies any surrounding redness or drainage. He has no new concerns today. Denies any systemic complaints such as fevers, chills, nausea, vomiting. No acute changes since last appointment, and no other complaints at this time.   Objective: AAO x3, NAD DP/PT pulses palpable bilaterally, CRT less than 3 seconds Plantar aspect right foot submetatarsal one hyperkeratotic lesion. Upon debridement there was evidence of verruca and there is no open sores identified. There is no swelling erythema, ascending synovitis. Minimal tenderness palpation to the area. No other lesions identified. No open lesions or pre-ulcerative lesions.  No pain with calf compression, swelling, warmth, erythema  Assessment: Verruca right foot  Plan: -All treatment options discussed with the patient including all alternatives, risks, complications.  -Lesion was sharply debrided today without any Locations. The area was cleaned and a pad was placed followed by salinocaine and a bandage. Post procedure instructions were discussed. Monitor for infection. -Discussed excision next appointment if not improved. -Patient encouraged to call the office with any questions, concerns, change in symptoms.   Celesta Gentile, DPM

## 2016-07-16 DIAGNOSIS — J309 Allergic rhinitis, unspecified: Secondary | ICD-10-CM | POA: Diagnosis not present

## 2016-07-16 DIAGNOSIS — R5383 Other fatigue: Secondary | ICD-10-CM | POA: Diagnosis not present

## 2016-07-16 DIAGNOSIS — R7989 Other specified abnormal findings of blood chemistry: Secondary | ICD-10-CM | POA: Diagnosis not present

## 2016-07-16 DIAGNOSIS — E039 Hypothyroidism, unspecified: Secondary | ICD-10-CM | POA: Diagnosis not present

## 2016-07-31 ENCOUNTER — Ambulatory Visit: Payer: BLUE CROSS/BLUE SHIELD | Admitting: Podiatry

## 2016-08-01 ENCOUNTER — Ambulatory Visit (INDEPENDENT_AMBULATORY_CARE_PROVIDER_SITE_OTHER): Payer: BLUE CROSS/BLUE SHIELD | Admitting: Podiatry

## 2016-08-01 ENCOUNTER — Encounter: Payer: Self-pay | Admitting: Podiatry

## 2016-08-01 DIAGNOSIS — B079 Viral wart, unspecified: Secondary | ICD-10-CM | POA: Diagnosis not present

## 2016-08-01 DIAGNOSIS — B070 Plantar wart: Secondary | ICD-10-CM | POA: Diagnosis not present

## 2016-08-01 NOTE — Progress Notes (Signed)
Subjective: 36 year old male presents to the office today for follow-up evaluation of a wart to the right foot along submetatarsal one. He's the area continues is not improved. He is requesting that the area be cut out today. Denies any swelling or redness or any drainage. He has no new concerns today. Denies any systemic complaints such as fevers, chills, nausea, vomiting. No acute changes since last appointment, and no other complaints at this time.   Objective: AAO x3, NAD DP/PT pulses palpable bilaterally, CRT less than 3 seconds On the plantar aspect of the right foot second metatarsal 1 continues with a verruca. There is tenderness palpation. There is no swelling or redness or drainage. No signs of infection. No open lesions or pre-ulcerative lesions.  No pain with calf compression, swelling, warmth, erythema  Assessment: Verruca right foot  Plan: -All treatment options discussed with the patient including all alternatives, risks, complications.  -At this time he is requesting that the area be cut out. Discussed within the procedure as well as the postoperative course as well as risks, complications. He wishes to proceed. Under sterile conditions 2 mL of lidocaine with epinephrine was infiltrated directly underneath the lesion. Once anesthetized the area is prepped in sterile fashion. Utilizing a 15 with scalpel the lesion was cut out circumferentially around the area and a curette was utilized to remove the lesion. This was sent to lab for evaluation. Area was cleaned phenol was applied here gear with alcohol. Silvadene was applied followed by a dressing. He tolerated the procedure well and without complications. Post procedure tractions were discussed. -Monitor for any clinical signs or symptoms of infection and directed to call the office immediately should any occur or go to the ER. -RTC 1 week  Celesta Gentile, DPM

## 2016-08-01 NOTE — Patient Instructions (Signed)
Soak Instructions    THE DAY AFTER THE PROCEDURE  Place 1/4 cup of epsom salts in a quart of warm tap water.  Submerge your foot or feet with outer bandage intact for the initial soak; this will allow the bandage to become moist and wet for easy lift off.  Once you remove your bandage, continue to soak in the solution for 20 minutes.  This soak should be done twice a day.  Next, remove your foot or feet from solution, blot dry the affected area and cover.  You may use a band aid large enough to cover the area or use gauze and tape.  Apply other medications to the area as directed by the doctor such as polysporin neosporin.  IF YOUR SKIN BECOMES IRRITATED WHILE USING THESE INSTRUCTIONS, IT IS OKAY TO SWITCH TO  WHITE VINEGAR AND WATER. Or you may use antibacterial soap and water to keep the toe clean  Monitor for any signs/symptoms of infection. Call the office immediately if any occur or go directly to the emergency room. Call with any questions/concerns.   Wart Surgery-Directions for [place] will need: Dial antibacterial hand soap, sterile gauze,  Band-aids  1. Keep the original bandage on until the following morning.  Bathe or shower with the bandage on allowing it to soak, so that when removed it won't stick to the wound. 2. After showering or bathing, remove the old bandage and cleanse the area with Dial soap and water.  Place a few drops of Dial soap and water on a piece of guaze and gently scrub the area.  Dry with a clean piece of gauze. 3. Apply antibiotic cream (polysporin, triple antibiotic or similar) to the area and place a clean square gauze bandage over and cover with a band-aid. 4. In the evening, add a few drops of Dial soap to a basin of lukewarm water and soak your foot for 15 minutes.  After soaking, follow the instructions above for cleaning the area. 5. Continue cleansing the area as described above two times a day, applying sterile gauze dressings until the doctor  informs you that it is not needed. 6. The charge for the surgical procedure includes the follow-up visits after surgery.  Additional treatments (if necessary) are not included. 7. The time required to heal the surgical site will depend upon the size and location of the wart.  Lesions under bony prominences heal slower.  The average healing time is 2 to 4 weeks. 8. Take over the counter Ibuprofen or Tylenol as needed should you experience any discomfort 9. If you do experience discomfort after surgery, keep the foot elevated and apply an ice pack over your ankle, 30 minutes on, 30 minutes off each hour for the rest of the day. 10. If you have any questions , please do not hesitate to contact the office.

## 2016-08-09 ENCOUNTER — Ambulatory Visit (INDEPENDENT_AMBULATORY_CARE_PROVIDER_SITE_OTHER): Payer: Self-pay | Admitting: Podiatry

## 2016-08-09 ENCOUNTER — Encounter: Payer: Self-pay | Admitting: Podiatry

## 2016-08-09 DIAGNOSIS — B070 Plantar wart: Secondary | ICD-10-CM

## 2016-08-09 NOTE — Patient Instructions (Signed)

## 2016-08-09 NOTE — Progress Notes (Signed)
Subjective: 36 year old male presents the office today one week after having verruca excision right foot. He states he is continue soaking in Epson salts and covering with antibiotic ointment and bandage daily. Denies any drainage or pus. He states the area still tender when working out doing a lot of walking. He has no concerns today. Denies any systemic complaints such as fevers, chills, nausea, vomiting. No acute changes since last appointment, and no other complaints at this time.   Objective: AAO x3, NAD DP/PT pulses palpable bilaterally, CRT less than 3 seconds On the plantar aspect of the right foot submetatarsal one on the area of previous verruca granular wound is present which is healing in. There is no drainage or pus there is no swelling erythema, ascending synovitis. There is no fluctuance, crepitus, malodor. There is no clinical signs of infection. Mild tenderness to palpation of the area. No open lesions or pre-ulcerative lesions.  No pain with calf compression, swelling, warmth, erythema  Assessment: Healing wounds status post verruca excision without any signs of infection  Plan: -All treatment options discussed with the patient including all alternatives, risks, complications.  -Continue soaking in Epson salt soaks twice a day cover with antibiotic ointment and bandaged in the day. Can leave the area uncovered at night are dry bandage at night. Monitor for any signs or symptoms of infection to call the office immediately should any occur. Follow-up in 2 weeks if the areas not completely healed or sooner if needed. -Discussed pathology results. -Patient encouraged to call the office with any questions, concerns, change in symptoms.   Celesta Gentile, DPM

## 2016-09-19 ENCOUNTER — Encounter: Payer: Self-pay | Admitting: Internal Medicine

## 2016-09-30 DIAGNOSIS — E291 Testicular hypofunction: Secondary | ICD-10-CM | POA: Diagnosis not present

## 2016-09-30 DIAGNOSIS — E039 Hypothyroidism, unspecified: Secondary | ICD-10-CM | POA: Diagnosis not present

## 2016-09-30 DIAGNOSIS — E559 Vitamin D deficiency, unspecified: Secondary | ICD-10-CM | POA: Diagnosis not present

## 2016-09-30 DIAGNOSIS — F39 Unspecified mood [affective] disorder: Secondary | ICD-10-CM | POA: Diagnosis not present

## 2016-10-14 LAB — LIPID PANEL
CHOLESTEROL: 3 (ref 0–200)
HDL: 65 (ref 35–70)
LDL Cholesterol: 99
Triglycerides: 83 (ref 40–160)

## 2016-10-14 LAB — HEPATIC FUNCTION PANEL
ALT: 18 (ref 10–40)
AST: 25 (ref 14–40)
Alkaline Phosphatase: 55 (ref 25–125)
Bilirubin, Total: 0.3

## 2016-10-14 LAB — BASIC METABOLIC PANEL
BUN: 17 (ref 4–21)
CREATININE: 0.9 (ref 0.6–1.3)
Glucose: 92

## 2016-10-14 LAB — HEMOGLOBIN A1C: HEMOGLOBIN A1C: 5.3

## 2016-10-15 DIAGNOSIS — J309 Allergic rhinitis, unspecified: Secondary | ICD-10-CM | POA: Diagnosis not present

## 2016-10-15 DIAGNOSIS — R5383 Other fatigue: Secondary | ICD-10-CM | POA: Diagnosis not present

## 2016-10-15 DIAGNOSIS — R7989 Other specified abnormal findings of blood chemistry: Secondary | ICD-10-CM | POA: Diagnosis not present

## 2016-10-15 DIAGNOSIS — E039 Hypothyroidism, unspecified: Secondary | ICD-10-CM | POA: Diagnosis not present

## 2016-10-17 ENCOUNTER — Encounter: Payer: Self-pay | Admitting: Internal Medicine

## 2016-10-18 ENCOUNTER — Ambulatory Visit: Payer: BLUE CROSS/BLUE SHIELD | Admitting: Nurse Practitioner

## 2016-10-18 ENCOUNTER — Encounter: Payer: Self-pay | Admitting: Internal Medicine

## 2016-10-24 ENCOUNTER — Encounter: Payer: Self-pay | Admitting: Internal Medicine

## 2016-10-30 ENCOUNTER — Encounter: Payer: Self-pay | Admitting: Internal Medicine

## 2016-11-05 ENCOUNTER — Other Ambulatory Visit: Payer: Self-pay | Admitting: Internal Medicine

## 2016-11-05 DIAGNOSIS — Z8249 Family history of ischemic heart disease and other diseases of the circulatory system: Secondary | ICD-10-CM | POA: Insufficient documentation

## 2016-11-13 ENCOUNTER — Other Ambulatory Visit: Payer: Self-pay | Admitting: Internal Medicine

## 2016-11-13 DIAGNOSIS — Z8249 Family history of ischemic heart disease and other diseases of the circulatory system: Secondary | ICD-10-CM

## 2016-11-13 DIAGNOSIS — I671 Cerebral aneurysm, nonruptured: Secondary | ICD-10-CM

## 2016-11-27 ENCOUNTER — Ambulatory Visit
Admission: RE | Admit: 2016-11-27 | Discharge: 2016-11-27 | Disposition: A | Discharge status: Home / Self Care | Payer: BLUE CROSS/BLUE SHIELD | Source: Ambulatory Visit | Attending: Internal Medicine | Admitting: Internal Medicine

## 2016-11-27 DIAGNOSIS — I671 Cerebral aneurysm, nonruptured: Secondary | ICD-10-CM

## 2016-11-27 DIAGNOSIS — Z8249 Family history of ischemic heart disease and other diseases of the circulatory system: Secondary | ICD-10-CM | POA: Diagnosis not present

## 2016-11-28 ENCOUNTER — Encounter: Payer: Self-pay | Admitting: Internal Medicine

## 2016-12-09 ENCOUNTER — Encounter: Payer: Self-pay | Admitting: Internal Medicine

## 2016-12-09 NOTE — Telephone Encounter (Signed)
Can you advise in PCP absence.  

## 2017-01-01 ENCOUNTER — Encounter: Payer: Self-pay | Admitting: Internal Medicine

## 2017-01-02 ENCOUNTER — Encounter: Payer: Self-pay | Admitting: Internal Medicine

## 2017-01-10 ENCOUNTER — Encounter: Payer: Self-pay | Admitting: Internal Medicine

## 2017-01-13 MED ORDER — DULOXETINE HCL 30 MG PO CPEP: 30.0000 mg | ORAL_CAPSULE | Freq: Every day | ORAL | 0 refills | Status: DC

## 2017-01-27 ENCOUNTER — Ambulatory Visit (INDEPENDENT_AMBULATORY_CARE_PROVIDER_SITE_OTHER): Payer: BLUE CROSS/BLUE SHIELD | Admitting: Internal Medicine

## 2017-01-27 ENCOUNTER — Other Ambulatory Visit (INDEPENDENT_AMBULATORY_CARE_PROVIDER_SITE_OTHER): Payer: BLUE CROSS/BLUE SHIELD

## 2017-01-27 ENCOUNTER — Encounter: Payer: Self-pay | Admitting: Internal Medicine

## 2017-01-27 VITALS — BP 120/78 | HR 98 | Temp 97.7°F | Resp 16 | Ht 70.0 in | Wt 188.0 lb

## 2017-01-27 DIAGNOSIS — K2100 Gastro-esophageal reflux disease with esophagitis, without bleeding: Secondary | ICD-10-CM

## 2017-01-27 DIAGNOSIS — N281 Cyst of kidney, acquired: Secondary | ICD-10-CM

## 2017-01-27 DIAGNOSIS — E039 Hypothyroidism, unspecified: Secondary | ICD-10-CM

## 2017-01-27 DIAGNOSIS — Z0000 Encounter for general adult medical examination without abnormal findings: Secondary | ICD-10-CM

## 2017-01-27 LAB — CBC WITH DIFFERENTIAL/PLATELET
Basophils Absolute: 0.1 10*3/uL (ref 0.0–0.1)
Basophils Relative: 1.5 % (ref 0.0–3.0)
Eosinophils Absolute: 0.1 10*3/uL (ref 0.0–0.7)
Eosinophils Relative: 0.6 % (ref 0.0–5.0)
HEMATOCRIT: 49.3 % (ref 39.0–52.0)
HEMOGLOBIN: 16.6 g/dL (ref 13.0–17.0)
LYMPHS PCT: 33.2 % (ref 12.0–46.0)
Lymphs Abs: 2.9 10*3/uL (ref 0.7–4.0)
MCHC: 33.8 g/dL (ref 30.0–36.0)
MCV: 94.6 fl (ref 78.0–100.0)
MONOS PCT: 10.1 % (ref 3.0–12.0)
Monocytes Absolute: 0.9 10*3/uL (ref 0.1–1.0)
NEUTROS PCT: 54.6 % (ref 43.0–77.0)
Neutro Abs: 4.7 10*3/uL (ref 1.4–7.7)
Platelets: 229 10*3/uL (ref 150.0–400.0)
RBC: 5.21 Mil/uL (ref 4.22–5.81)
RDW: 12.8 % (ref 11.5–15.5)
WBC: 8.6 10*3/uL (ref 4.0–10.5)

## 2017-01-27 LAB — URINALYSIS, ROUTINE W REFLEX MICROSCOPIC
BILIRUBIN URINE: NEGATIVE
HGB URINE DIPSTICK: NEGATIVE
Ketones, ur: NEGATIVE
LEUKOCYTES UA: NEGATIVE
Nitrite: NEGATIVE
Specific Gravity, Urine: 1.01 (ref 1.000–1.030)
Total Protein, Urine: NEGATIVE
UROBILINOGEN UA: 0.2 (ref 0.0–1.0)
Urine Glucose: NEGATIVE
pH: 6 (ref 5.0–8.0)

## 2017-01-27 LAB — TSH: TSH: 1.89 u[IU]/mL (ref 0.35–4.50)

## 2017-01-27 MED ORDER — ESOMEPRAZOLE MAGNESIUM 20 MG PO CPDR: 20.0000 mg | DELAYED_RELEASE_CAPSULE | Freq: Every day | ORAL | 1 refills | Status: DC

## 2017-01-27 NOTE — Progress Notes (Signed)
Subjective:  Patient ID: Jacob [name], male    DOB: 17-Mar-1980  Age: 37 y.o. MRN: [id number]  CC: Hypothyroidism; Annual Exam; and Gastroesophageal Reflux   HPI GARWOOD WENTZELL presents for a CPX - he complains of weight gain but otherwise feels well and offers no complaints.  His current dose of nature thyroid is 97.5 mcg a day.  Other than the weight gain he feels well at this dose.  He also feels like his emotions and mood are well controlled at the current dose of duloxetine.  He has a history of allergic rhinitis and says his symptoms are well controlled with montelukast and Zyrtec.  Outpatient Medications Prior to Visit  Medication Sig Dispense Refill  . cetirizine (ZYRTEC) 10 MG tablet Take 10 mg by mouth daily.      . clomiPHENE (CLOMID) 50 MG tablet Take 50 mg by mouth. 50 mg M,W,F and 25 mg T,Th,S,Su    . DULoxetine (CYMBALTA) 30 MG capsule Take 1 capsule (30 mg total) by mouth daily. Must keep appt for future refills 30 capsule 0  . montelukast (SINGULAIR) 10 MG tablet Take 10 mg by mouth daily.  11  . NATURE-THROID 32.5 MG tablet   0  . Thyroid (NATURE-THROID) 113.75 MG TABS Take 113.75 mg by mouth daily.     No facility-administered medications prior to visit.     ROS Review of Systems  Constitutional: Positive for unexpected weight change. Negative for appetite change, chills, diaphoresis, fatigue and fever.  HENT: Negative.  Negative for trouble swallowing and voice change.   Eyes: Negative.   Respiratory: Negative.  Negative for cough, chest tightness and shortness of breath.   Cardiovascular: Negative.  Negative for chest pain.  Gastrointestinal: Negative for abdominal pain, diarrhea, nausea and vomiting.  Endocrine: Negative for cold intolerance and heat intolerance.  Genitourinary: Negative.  Negative for difficulty urinating, penile swelling, scrotal swelling, testicular pain and urgency.  Musculoskeletal: Negative.   Skin: Negative for pallor.    Allergic/Immunologic: Negative.   Neurological: Negative.   Hematological: Negative for adenopathy. Does not bruise/bleed easily.  Psychiatric/Behavioral: Negative.  Negative for decreased concentration, dysphoric mood, self-injury, sleep disturbance and suicidal ideas. The patient is not nervous/anxious and is not hyperactive.     Objective:  BP 120/78 (BP Location: Left Arm, Patient Position: Sitting, Cuff Size: Normal)   Pulse 98   Temp 97.7 F (36.5 C) (Oral)   Resp 16   Ht 5\' 10"  (1.778 m)   Wt 188 lb (85.3 kg)   SpO2 96%   BMI 26.98 kg/m   BP Readings from Last 3 Encounters:  01/27/17 120/78  06/10/16 138/85  04/16/16 122/82    Wt Readings from Last 3 Encounters:  01/27/17 188 lb (85.3 kg)  04/16/16 187 lb (84.8 kg)  03/19/16 186 lb 1 oz (84.4 kg)    Physical Exam  Constitutional: He is oriented to person, place, and time.  HENT:  Mouth/Throat: No oropharyngeal exudate.  Eyes: Conjunctivae are normal. No scleral icterus.  Neck: Normal range of motion. Neck supple. No JVD present. No thyromegaly present.  Cardiovascular: Normal rate, regular rhythm and normal heart sounds.  No murmur heard. Pulmonary/Chest: Effort normal and breath sounds normal. No respiratory distress. He has no wheezes. He has no rales.  Abdominal: Soft. Bowel sounds are normal. He exhibits no mass. There is no tenderness. There is no guarding.  Musculoskeletal: Normal range of motion. He exhibits no edema or tenderness.  Lymphadenopathy:    He  has no cervical adenopathy.  Neurological: He is alert and oriented to person, place, and time.  Skin: Skin is warm and dry. No rash noted. He is not diaphoretic. No erythema. No pallor.  Psychiatric: He has a normal mood and affect. His behavior is normal. Judgment and thought content normal.  Vitals reviewed.   Lab Results  Component Value Date   WBC 8.6 01/27/2017   HGB 16.6 01/27/2017   HCT 49.3 01/27/2017   PLT 229.0 01/27/2017   GLUCOSE 64  (L) 12/04/2015   CHOL 3 10/14/2016   TRIG 83 10/14/2016   HDL 65 10/14/2016   LDLCALC 99 10/14/2016   ALT 18 10/14/2016   AST 25 10/14/2016   NA 142 12/04/2015   K 3.9 12/04/2015   CL 104 12/04/2015   CREATININE 0.9 10/14/2016   BUN 17 10/14/2016   CO2 32 12/04/2015   TSH 1.89 01/27/2017   HGBA1C 5.3 10/14/2016    Mr Jodene Nam Head Wo Contrast  Result Date: 11/27/2016 CLINICAL DATA:  Family history of aneurysm. EXAM: MRA HEAD WITHOUT CONTRAST TECHNIQUE: Angiographic images of the Circle of Willis were obtained using MRA technique without intravenous contrast. COMPARISON:  None. FINDINGS: Intracranial internal carotid arteries: Normal. Anterior cerebral arteries: Normal. Middle cerebral arteries: Normal. Posterior communicating arteries: Absent bilaterally. Posterior cerebral arteries: Normal. Basilar artery: Normal. Vertebral arteries: Left dominant. Normal. Superior cerebellar arteries: Normal. Anterior inferior cerebellar arteries: Normal. Posterior inferior cerebellar arteries: Normal. IMPRESSION: Normal intracranial MRA.  No aneurysm. Electronically Signed   By: Ulyses Jarred M.D.   On: 11/27/2016 22:22    Assessment & Plan:   Lesly was seen today for hypothyroidism, annual exam and gastroesophageal reflux.  Diagnoses and all orders for this visit:  Renal cyst, left- He has no symptoms related to this and his urine is normal.  We will continue to monitor for complications. -     Urinalysis, Routine w reflex microscopic; Future  Gastroesophageal reflux disease with esophagitis- His symptoms are well controlled with 20 mg of Nexium a day.  Will continue.  There are no alarm symptoms. -     CBC with Differential/Platelet; Future -     esomeprazole (NEXIUM) 20 MG capsule; Take 1 capsule (20 mg total) by mouth daily at 12 noon.  Acquired hypothyroidism- His TSH is in the normal range.  He will stay on the current dose of thyroid replacement therapy. -     TSH; Future -     thyroid  (ARMOUR) 32.5 MG tablet; Take 1 tablet (32.5 mg total) by mouth daily. -     thyroid (ARMOUR) 65 MG tablet; Take 1 tablet (65 mg total) by mouth daily.  Routine general medical examination at a health care facility- Exam completed, labs reviewed, vaccines reviewed, patient education material was given.   I have discontinued Gaston Dase. Skarda's Thyroid and NATURE-THROID. I am also having him start on esomeprazole, thyroid, and thyroid. Additionally, I am having him maintain his cetirizine, clomiPHENE, montelukast, and DULoxetine.  Meds ordered this encounter  Medications  . esomeprazole (NEXIUM) 20 MG capsule    Sig: Take 1 capsule (20 mg total) by mouth daily at 12 noon.    Dispense:  90 capsule    Refill:  1  . thyroid (ARMOUR) 32.5 MG tablet    Sig: Take 1 tablet (32.5 mg total) by mouth daily.    Dispense:  90 tablet    Refill:  1  . thyroid (ARMOUR) 65 MG tablet    Sig: Take  1 tablet (65 mg total) by mouth daily.    Dispense:  90 tablet    Refill:  1     Follow-up: Return if symptoms worsen or fail to improve.  Scarlette Calico, MD

## 2017-01-27 NOTE — Patient Instructions (Signed)

## 2017-01-28 ENCOUNTER — Encounter: Payer: Self-pay | Admitting: Internal Medicine

## 2017-01-30 MED ORDER — THYROID 32.5 MG PO TABS: 32.5000 mg | ORAL_TABLET | Freq: Every day | ORAL | 1 refills | Status: DC

## 2017-01-30 MED ORDER — THYROID 65 MG PO TABS: 65.0000 mg | ORAL_TABLET | Freq: Every day | ORAL | 1 refills | Status: DC

## 2017-02-17 ENCOUNTER — Encounter: Payer: Self-pay | Admitting: Internal Medicine

## 2017-02-18 MED ORDER — DULOXETINE HCL 30 MG PO CPEP: 30.0000 mg | ORAL_CAPSULE | Freq: Every day | ORAL | 3 refills | Status: DC

## 2017-04-02 DIAGNOSIS — E559 Vitamin D deficiency, unspecified: Secondary | ICD-10-CM | POA: Diagnosis not present

## 2017-04-02 DIAGNOSIS — E291 Testicular hypofunction: Secondary | ICD-10-CM | POA: Diagnosis not present

## 2017-04-02 DIAGNOSIS — F17200 Nicotine dependence, unspecified, uncomplicated: Secondary | ICD-10-CM | POA: Diagnosis not present

## 2017-04-02 DIAGNOSIS — F39 Unspecified mood [affective] disorder: Secondary | ICD-10-CM | POA: Diagnosis not present

## 2017-04-02 DIAGNOSIS — E039 Hypothyroidism, unspecified: Secondary | ICD-10-CM | POA: Diagnosis not present

## 2017-04-08 ENCOUNTER — Encounter: Payer: Self-pay | Admitting: Family Medicine

## 2017-04-08 ENCOUNTER — Ambulatory Visit (INDEPENDENT_AMBULATORY_CARE_PROVIDER_SITE_OTHER): Payer: BLUE CROSS/BLUE SHIELD | Admitting: Family Medicine

## 2017-04-08 VITALS — BP 132/64 | HR 72 | Temp 98.7°F | Ht 70.0 in | Wt 191.0 lb

## 2017-04-08 DIAGNOSIS — R6889 Other general symptoms and signs: Secondary | ICD-10-CM | POA: Insufficient documentation

## 2017-04-08 MED ORDER — OSELTAMIVIR PHOSPHATE 75 MG PO CAPS: 75.0000 mg | ORAL_CAPSULE | Freq: Two times a day (BID) | ORAL | 0 refills | Status: DC

## 2017-04-08 MED ORDER — AZITHROMYCIN 250 MG PO TABS: ORAL_TABLET | ORAL | 0 refills | Status: DC

## 2017-04-08 NOTE — Patient Instructions (Signed)
Please try things such as zyrtec-D or allegra-D which is an antihistamine and decongestant.   Please try afrin which will help with nasal congestion but use for only three days.   Please also try using a netti pot on a regular occasion.  Honey can help with a sore throat.   Vick's and Delsym can help with cough.

## 2017-04-08 NOTE — Assessment & Plan Note (Signed)
Possible for flu. Has ongoing cough as well.  - tamiflu. With three week old at home.  - azithromycin. Can have on hand if his symptoms don't improve.  - counseled on supportive care.

## 2017-04-08 NOTE — Progress Notes (Signed)
Jacob [name] - 37 y.o. male MRN [id number]  Date of birth: 1981/01/09  SUBJECTIVE:  Including CC & ROS.  Chief Complaint  Patient presents with  . Flu like symptoms    Jacob [name] is a 37 y.o. male that is presenting with a cough. Ongoing for three weeks. Admits to productive cough with green mucous. He has been around a coworker with symptoms. Admits to headaches, chills body aches, and night sweats. He has been taking tylenol and sudafed. He does get the flu shot. He has a three week old at home.     Review of Systems  Constitutional: Positive for chills and fatigue. Negative for fever.  HENT: Positive for congestion. Negative for sore throat.   Respiratory: Positive for cough.   Cardiovascular: Negative for chest pain.  Gastrointestinal: Negative for abdominal pain.  Musculoskeletal: Negative for gait problem.  Skin: Negative for color change.  Neurological: Positive for headaches.  Hematological: Negative for adenopathy.  Psychiatric/Behavioral: Negative for agitation.    HISTORY: Past Medical, Surgical, Social, and Family History Reviewed & Updated per EMR.   Pertinent Historical Findings include:  Past Medical History:  Diagnosis Date  . Anxiety   . 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 Depression   . Genital warts   . GERD (gastroesophageal reflux disease)    w/ HH  . H/O alcohol abuse    sober since 02/04/2007  . Hypothyroid     Past Surgical History:  Procedure Laterality Date  . WRIST SURGERY     left    No Known Allergies  Family History  Problem Relation Age of Onset  . Heart disease Father 6  . Hyperlipidemia Father   . Hypertension Father   . Diabetes Sister 69     Social History   Socioeconomic History  . Marital status: Married    Spouse name: Not on file  . Number of children: 0  . Years of education: Not on file  . Highest education level: Not on  file  Social Needs  . Financial resource strain: Not on file  . Food insecurity - worry: Not on file  . Food insecurity - inability: Not on file  . Transportation needs - medical: Not on file  . Transportation needs - non-medical: Not on file  Occupational History    Employer: [employer]  Tobacco Use  . Smoking status: Former Smoker    Types: Cigarettes    Last attempt to quit: 03/28/2008    Years since quitting: 9.0  . Smokeless tobacco: Never Used  . Tobacco comment: using lozenges  Substance and Sexual Activity  . Alcohol use: No    Alcohol/week: 0.0 oz  . Drug use: No  . Sexual activity: Not on file  Other Topics Concern  . 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     PHYSICAL EXAM:  VS: BP 132/64 (BP Location: Left Arm, Patient Position: Sitting, Cuff Size: Normal)   Pulse 72   Temp 98.7 F (37.1 C) (Oral)   Ht 5\' 10"  (1.778 m)   Wt 191 lb (86.6 kg)   SpO2 97%   BMI 27.41 kg/m  Physical Exam Gen: NAD, alert, cooperative with exam,  ENT:  normal lips, normal nasal mucosa, tympanic membranes clear and intact bilaterally, normal oropharynx, no cervical lymphadenopathy Eye: normal EOM, normal conjunctiva and lids CV:  no edema, +2 pedal pulses, regular rate and rhythm, S1-S2   Resp: no accessory muscle use, non-labored, clear to auscultation bilaterally, no crackles or wheezes  Skin: no rashes, no areas of induration  Neuro: normal tone, normal sensation to touch Psych:  normal insight, alert and oriented MSK: Normal gait, normal strength        ASSESSMENT & PLAN:   Flu-like symptoms Possible for flu. Has ongoing cough as well.  - tamiflu. With three week old at home.  - azithromycin. Can have on hand if his symptoms don't improve.  - counseled on supportive  care.

## 2017-04-15 DIAGNOSIS — E039 Hypothyroidism, unspecified: Secondary | ICD-10-CM | POA: Diagnosis not present

## 2017-04-15 DIAGNOSIS — J309 Allergic rhinitis, unspecified: Secondary | ICD-10-CM | POA: Diagnosis not present

## 2017-04-15 DIAGNOSIS — R7989 Other specified abnormal findings of blood chemistry: Secondary | ICD-10-CM | POA: Diagnosis not present

## 2017-04-15 DIAGNOSIS — R5383 Other fatigue: Secondary | ICD-10-CM | POA: Diagnosis not present

## 2017-05-01 ENCOUNTER — Encounter: Payer: Self-pay | Admitting: Family Medicine

## 2017-05-01 ENCOUNTER — Ambulatory Visit (INDEPENDENT_AMBULATORY_CARE_PROVIDER_SITE_OTHER): Payer: BLUE CROSS/BLUE SHIELD | Admitting: Family Medicine

## 2017-05-01 VITALS — BP 122/66 | HR 68 | Temp 98.3°F | Ht 70.0 in | Wt 191.0 lb

## 2017-05-01 DIAGNOSIS — M7711 Lateral epicondylitis, right elbow: Secondary | ICD-10-CM | POA: Diagnosis not present

## 2017-05-01 MED ORDER — DICLOFENAC SODIUM 2 % TD SOLN: 1.0000 "application " | Freq: Two times a day (BID) | TRANSDERMAL | 3 refills | Status: DC

## 2017-05-01 MED ORDER — NITROGLYCERIN 0.2 MG/HR TD PT24: MEDICATED_PATCH | TRANSDERMAL | 11 refills | Status: DC

## 2017-05-01 NOTE — Patient Instructions (Signed)
Good to see you!   Nitroglycerin Protocol   Apply 1/4 nitroglycerin patch to affected area daily.  Change position of patch within the affected area every 24 hours.  You may experience a headache during the first 1-2 weeks of using the patch, these should subside.  If you experience headaches after beginning nitroglycerin patch treatment, you may take your preferred over the counter pain reliever.  Another side effect of the nitroglycerin patch is skin irritation or rash related to patch adhesive.  Please notify our office if you develop more severe headaches or rash, and stop the patch.  Tendon healing with nitroglycerin patch may require 12 to 24 weeks depending on the extent of injury.  Men should not use if taking Viagra, Cialis, or Levitra.   Do not use if you have migraines or rosacea.   Please follow up with me in 4-6 weeks if this doesn't seem to be improving.

## 2017-05-01 NOTE — Progress Notes (Signed)
Jacob [name] - 37 y.o. male MRN [id number]  Date of birth: 11/27/1980  SUBJECTIVE:  Including CC & ROS.  Chief Complaint  Patient presents with  . Right elbow pain    Jacob [name] is a 37 y.o. male that is presenting with right elbow pain. Pain has been chronic, increasing over the past three months. Pain is located on the lateral epicondyle. Pain radiates to his forearm.  He notices the pain with activity and lifting his son. Pain is mild to severe upon flexion. He has been taking motrin for the pain. Denies injury or surgeries. He used to play tennis and piano when he was growing up. Denies numbness or tingling.    Review of Systems  Constitutional: Negative for fever.  HENT: Negative for congestion.   Respiratory: Negative for cough.   Cardiovascular: Negative for chest pain.  Gastrointestinal: Negative for abdominal pain.  Musculoskeletal: Negative for joint swelling.  Skin: Negative for color change.  Allergic/Immunologic: Negative for immunocompromised state.  Neurological: Negative for weakness.  Hematological: Negative for adenopathy.  Psychiatric/Behavioral: Negative for agitation.    HISTORY: Past Medical, Surgical, Social, and Family History Reviewed & Updated per EMR.   Pertinent Historical Findings include:  Past Medical History:  Diagnosis Date  . Anxiety   . 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 Depression   . Genital warts   . GERD (gastroesophageal reflux disease)    w/ HH  . H/O alcohol abuse    sober since 02/04/2007  . Hypothyroid     Past Surgical History:  Procedure Laterality Date  . WRIST SURGERY     left    No Known Allergies  Family History  Problem Relation Age of Onset  . Heart disease Father 15  . Hyperlipidemia Father   . Hypertension Father   . Diabetes Sister 65     Social History   Socioeconomic History  . Marital status: Married    Spouse  name: Not on file  . Number of children: 0  . Years of education: Not on file  . Highest education level: Not on file  Occupational History    Employer: [employer]  Social Needs  . Financial resource strain: Not on file  . Food insecurity:    Worry: Not on file    Inability: Not on file  . Transportation needs:    Medical: Not on file    Non-medical: Not on file  Tobacco Use  . Smoking status: Former Smoker    Types: Cigarettes    Last attempt to quit: 03/28/2008    Years since quitting: 9.0  . Smokeless tobacco: Never Used  . Tobacco comment: using lozenges  Substance and Sexual Activity  . Alcohol use: No    Alcohol/week: 0.0 oz  . Drug use: No  . Sexual activity: Not on file  Lifestyle  . Physical activity:    Days per week: Not on file    Minutes per session: Not on file  . Stress: Not on file  Relationships  . Social connections:    Talks on phone: Not on file    Gets together: Not on file    Attends religious service: Not on file    Active member of club or organization: Not on file    Attends meetings of clubs or organizations: Not on file    Relationship status: Not on file  . Intimate  partner violence:    Fear of current or ex partner: Not on file    Emotionally abused: Not on file    Physically abused: Not on file    Forced sexual activity: Not on file  Other Topics Concern  . 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     PHYSICAL EXAM:  VS: BP 122/66 (BP Location: Left Arm, Patient Position: Sitting, Cuff Size: Normal)   Pulse 68   Temp 98.3 F (36.8 C) (Oral)   Ht 5\' 10"  (1.778 m)   Wt 191 lb (86.6 kg)   SpO2 98%   BMI 27.41 kg/m  Physical Exam Gen: NAD, alert, cooperative with exam, well-appearing ENT: normal lips, normal nasal mucosa,  Eye:  normal EOM, normal conjunctiva and lids CV:  no edema, +2 pedal pulses   Resp: no accessory muscle use, non-labored,  Skin: no rashes, no areas of induration  Neuro: normal tone, normal sensation to touch Psych:  normal insight, alert and oriented MSK:  Right elbow:  Normal ROM  TTP of the lateral LE  No wrist ROM  Normal grip strength  Neurovascularly intact   Limited ultrasound: right elbow:  Normal origin at common extensors. Mild uptake of vascularity of the common extensors.  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   Ultrasound and interpretation by Clearance Coots, MD          ASSESSMENT & PLAN:   Lateral epicondylitis of right elbow Has received an injection in the past that helped but now having pain again. 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  - encouraged different workout routines.  - nitro patches  - counseled on HEP  - would encouraged compression  - if no improvement in 4-6 weeks, consider MRI. Could consider PRP, PT or steroid injection.

## 2017-05-01 NOTE — Assessment & Plan Note (Addendum)
Has received an injection in the past that helped but now having pain again. 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  - encouraged different workout routines.  - nitro patches  - counseled on HEP  - would encouraged compression  - if no improvement in 4-6 weeks, consider MRI. Could consider PRP, PT or steroid injection.

## 2017-05-13 ENCOUNTER — Ambulatory Visit (INDEPENDENT_AMBULATORY_CARE_PROVIDER_SITE_OTHER): Payer: BLUE CROSS/BLUE SHIELD | Admitting: Podiatry

## 2017-05-13 ENCOUNTER — Encounter: Payer: Self-pay | Admitting: Podiatry

## 2017-05-13 DIAGNOSIS — L989 Disorder of the skin and subcutaneous tissue, unspecified: Secondary | ICD-10-CM

## 2017-05-13 DIAGNOSIS — T148XXA Other injury of unspecified body region, initial encounter: Secondary | ICD-10-CM

## 2017-05-14 ENCOUNTER — Encounter: Payer: Self-pay | Admitting: Podiatry
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
## 2017-05-15 ENCOUNTER — Other Ambulatory Visit: Payer: Self-pay | Admitting: Podiatry

## 2017-05-15 MED ORDER — MUPIROCIN 2 % EX OINT: 1.0000 "application " | TOPICAL_OINTMENT | Freq: Two times a day (BID) | CUTANEOUS | 2 refills | Status: DC

## 2017-05-27 ENCOUNTER — Encounter: Payer: Self-pay | Admitting: Family Medicine

## 2017-06-06 ENCOUNTER — Ambulatory Visit (INDEPENDENT_AMBULATORY_CARE_PROVIDER_SITE_OTHER): Payer: BLUE CROSS/BLUE SHIELD | Admitting: Podiatry

## 2017-06-06 ENCOUNTER — Encounter: Payer: Self-pay | Admitting: Podiatry

## 2017-06-06 DIAGNOSIS — B070 Plantar wart: Secondary | ICD-10-CM | POA: Diagnosis not present

## 2017-06-09 NOTE — Progress Notes (Signed)
  Subjective: 37 year old male presents the office today for follow-up evaluation of left foot skin lesion.  States that he had no complications for the last treatment and overall is doing much better.  His pain is improving denies any redness or drainage or any swelling.  He also states that the abrasions to his feet are doing much better he has been using mupirocin ointment on the area daily. Denies any systemic complaints such as fevers, chills, nausea, vomiting. No acute changes since last appointment, and no other complaints at this time.   Objective: AAO x3, NAD DP/PT pulses palpable bilaterally, CRT less than 3 seconds Skin abrasion severity almost completely healed at this time.  There is no signs of infection. Hyperkeratotic lesion left foot submetatarsal 1.  Upon debridement today appears to be more superficial and smaller and today there is a evidence of verruca to the area.  There is no surrounding erythema, ascending cellulitis.  There is no fluctuation or crepitation there is no malodor.  No other lesions are identified at this time.  This appears to be a deep punctate lesion.   No open lesions or pre-ulcerative lesions.  No pain with calf compression, swelling, warmth, erythema  Assessment: Verruca left foot  Plan: -All treatment options discussed with the patient including all alternatives, risks, complications.  -Continue mupirocin ointment on the skin abrasions daily.  They appear to be healing well. -Skin lesion on the left foot was sharply debrided to the any complications or bleeding.  The area was cleaned with alcohol and a pad was placed followed by salicylic acid and a bandage.  Post procedure instructions were discussed.  Monitoring signs or symptoms of infection.  Continue offloading. -Follow-up in 3 weeks if symptoms continue or sooner if needed.  Trula Slade DPM

## 2017-06-17 DIAGNOSIS — J301 Allergic rhinitis due to pollen: Secondary | ICD-10-CM | POA: Diagnosis not present

## 2017-06-27 ENCOUNTER — Ambulatory Visit: Payer: BLUE CROSS/BLUE SHIELD | Admitting: Podiatry

## 2017-07-01 DIAGNOSIS — J301 Allergic rhinitis due to pollen: Secondary | ICD-10-CM | POA: Diagnosis not present

## 2017-07-03 ENCOUNTER — Encounter: Payer: Self-pay | Admitting: Podiatry

## 2017-07-03 ENCOUNTER — Ambulatory Visit (INDEPENDENT_AMBULATORY_CARE_PROVIDER_SITE_OTHER): Payer: BLUE CROSS/BLUE SHIELD | Admitting: Podiatry

## 2017-07-03 DIAGNOSIS — B070 Plantar wart: Secondary | ICD-10-CM | POA: Diagnosis not present

## 2017-07-03 NOTE — Patient Instructions (Signed)
Keep the bandage on for 24 hours. At that time, remove and clean with soap and water. If it hurts or burns before 24 hours go ahead and remove the bandage and wash with soap and water. Keep the area clean. If there is any blistering cover with antibiotic ointment and a bandage. Monitor for any redness, drainage, or other signs of infection. Call the office if any are to occur. If you have any questions, please call the office at [phone].  

## 2017-07-08 NOTE — Progress Notes (Signed)
Subjective: 37 year old male presents the office today for follow-up evaluation of a wart on the left foot.  States the left foot feels like it is almost gone but looks like the right side is starting to come back.  Denies any pain denies any redness or drainage or any swelling.  No redness and no complications after the last treatment. Denies any systemic complaints such as fevers, chills, nausea, vomiting. No acute changes since last appointment, and no other complaints at this time.   Objective: AAO x3, NAD DP/PT pulses palpable bilaterally, CRT less than 3 seconds Bilateral symmetrical with hyperkeratotic lesions.  Upon debridement on the right side he does not appear to be small recurrence of the verruca.  Left side appears to be almost fully resolved.  No significant tenderness there is no surrounding edema, erythema, drainage or pus or any clinical signs of infection.  No open lesions or pre-ulcerative lesions.  No pain with calf compression, swelling, warmth, erythema  Assessment: Bilateral verruca  Plan: -All treatment options discussed with the patient including all alternatives, risks, complications.  -Lesions were sharply debrided after the area was cleaned with a #312 blade scalpel.  Areas were cleaned with alcohol and salicylic acid was applied followed by an occlusive bandage.  Post procedure instructions were discussed.  Monitor for signs or symptoms of infection -Patient encouraged to call the office with any questions, concerns, change in symptoms.   Return in about 3 weeks (around 07/24/2017).  Trula Slade DPM

## 2017-07-14 ENCOUNTER — Encounter: Payer: Self-pay | Admitting: Podiatry

## 2017-07-29 ENCOUNTER — Ambulatory Visit (INDEPENDENT_AMBULATORY_CARE_PROVIDER_SITE_OTHER): Payer: BLUE CROSS/BLUE SHIELD | Admitting: Podiatry

## 2017-07-31 ENCOUNTER — Ambulatory Visit (INDEPENDENT_AMBULATORY_CARE_PROVIDER_SITE_OTHER): Payer: BLUE CROSS/BLUE SHIELD | Admitting: Podiatry

## 2017-07-31 DIAGNOSIS — B070 Plantar wart: Secondary | ICD-10-CM | POA: Diagnosis not present

## 2017-07-31 NOTE — Patient Instructions (Signed)
Keep the bandage on for 24 hours. At that time, remove and clean with soap and water. If it hurts or burns before 24 hours go ahead and remove the bandage and wash with soap and water. Keep the area clean. If there is any blistering cover with antibiotic ointment and a bandage. Monitor for any redness, drainage, or other signs of infection. Call the office if any are to occur. If you have any questions, please call the office at [phone].  

## 2017-08-03 NOTE — Progress Notes (Signed)
Subjective: Lysander presents the office today for follow-up evaluation of warts to both of his feet.  He says the left side is done very well looks like it has gone however the right side is come back.  Denies any pain in the area denies any redness or drainage.  He had no complications after the last treatments.  He has no other concerns. Denies any systemic complaints such as fevers, chills, nausea, vomiting. No acute changes since last appointment, and no other complaints at this time.   Objective: AAO x3, NAD DP/PT pulses palpable bilaterally, CRT less than 3 seconds Hyperkeratotic lesion submetatarsal 1 bilaterally.  Upon debridement of the left a wart has healed however there is recurrence on the right side.  No pain, surrounding redness or drainage or any signs of infection. No open lesions or pre-ulcerative lesions.  No pain with calf compression, swelling, warmth, erythema  Assessment: Recurrent verruca  Plan: -All treatment options discussed with the patient including all alternatives, risks, complications.  -Lesions were debrided x2 without any complications or bleeding.  A pad was placed bilaterally followed by salicylic acid and a bandage after the area was cleaned with alcohol.  Post procedure instructions were discussed.  Monitoring signs or symptoms of infection. -I ordered a wart cream through [facility] -Patient encouraged to call the office with any questions, concerns, change in symptoms.   Trula Slade DPM

## 2017-08-24 ENCOUNTER — Encounter: Payer: Self-pay | Admitting: Podiatry

## 2017-08-28 ENCOUNTER — Ambulatory Visit: Payer: BLUE CROSS/BLUE SHIELD | Admitting: Podiatry

## 2017-10-01 ENCOUNTER — Ambulatory Visit: Payer: BLUE CROSS/BLUE SHIELD | Admitting: Family Medicine

## 2017-10-01 NOTE — Progress Notes (Deleted)
Jacob [name] - 37 y.o. male MRN [id number]  Date of birth: 11-Sep-1980  SUBJECTIVE:  Including CC & ROS.  No chief complaint on file.   Jacob [name] is a 37 y.o. male that is  ***.  ***   Review of Systems  HISTORY: Past Medical, Surgical, Social, and Family History Reviewed & Updated per EMR.   Pertinent Historical Findings include:  Past Medical History:  Diagnosis Date  . Anxiety   . 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 Depression   . Genital warts   . GERD (gastroesophageal reflux disease)    w/ HH  . H/O alcohol abuse    sober since 02/04/2007  . Hypothyroid     Past Surgical History:  Procedure Laterality Date  . WRIST SURGERY     left    No Known Allergies  Family History  Problem Relation Age of Onset  . Heart disease Father 23  . Hyperlipidemia Father   . Hypertension Father   . Diabetes Sister 95     Social History   Socioeconomic History  . Marital status: Married    Spouse name: Not on file  . Number of children: 0  . Years of education: Not on file  . Highest education level: Not on file  Occupational History    Employer: [employer]  Social Needs  . Financial resource strain: Not on file  . Food insecurity:    Worry: Not on file    Inability: Not on file  . Transportation needs:    Medical: Not on file    Non-medical: Not on file  Tobacco Use  . Smoking status: Former Smoker    Types: Cigarettes    Last attempt to quit: 03/28/2008    Years since quitting: 9.5  . Smokeless tobacco: Never Used  . Tobacco comment: using lozenges  Substance and Sexual Activity  . Alcohol use: No  . Drug use: No  . Sexual activity: Not on file  Lifestyle  . Physical activity:    Days per week: Not on file    Minutes per session: Not on file  . Stress: Not on file  Relationships  . Social connections:    Talks on phone: Not on file    Gets together: Not on  file    Attends religious service: Not on file    Active member of club or organization: Not on file    Attends meetings of clubs or organizations: Not on file    Relationship status: Not on file  . Intimate partner violence:    Fear of current or ex partner: Not on file    Emotionally abused: Not on file    Physically abused: Not on file    Forced sexual activity: Not on file  Other Topics Concern  . 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 Physical Exam Gen: NAD, alert, cooperative with exam, well-appearing ENT: normal lips, normal nasal mucosa,  Eye: normal EOM, normal conjunctiva and lids CV:  no edema, +2 pedal pulses   Resp: no accessory muscle use, non-labored,  GI: no masses or tenderness, no  hernia  Skin: no rashes, no areas of induration  Neuro: normal tone, normal sensation to touch Psych:  normal insight, alert and oriented MSK:  ***      ASSESSMENT & PLAN:   No problem-specific Assessment & Plan notes found for this encounter.

## 2017-10-03 ENCOUNTER — Ambulatory Visit: Payer: BLUE CROSS/BLUE SHIELD | Admitting: Family Medicine

## 2017-10-03 DIAGNOSIS — E291 Testicular hypofunction: Secondary | ICD-10-CM | POA: Diagnosis not present

## 2017-10-03 DIAGNOSIS — F17200 Nicotine dependence, unspecified, uncomplicated: Secondary | ICD-10-CM | POA: Diagnosis not present

## 2017-10-03 DIAGNOSIS — E039 Hypothyroidism, unspecified: Secondary | ICD-10-CM | POA: Diagnosis not present

## 2017-10-03 DIAGNOSIS — E559 Vitamin D deficiency, unspecified: Secondary | ICD-10-CM | POA: Diagnosis not present

## 2017-10-03 DIAGNOSIS — F39 Unspecified mood [affective] disorder: Secondary | ICD-10-CM | POA: Diagnosis not present

## 2017-10-14 DIAGNOSIS — R7989 Other specified abnormal findings of blood chemistry: Secondary | ICD-10-CM | POA: Diagnosis not present

## 2017-10-14 DIAGNOSIS — E039 Hypothyroidism, unspecified: Secondary | ICD-10-CM | POA: Diagnosis not present

## 2017-10-14 DIAGNOSIS — R5383 Other fatigue: Secondary | ICD-10-CM | POA: Diagnosis not present

## 2017-10-14 DIAGNOSIS — J309 Allergic rhinitis, unspecified: Secondary | ICD-10-CM | POA: Diagnosis not present

## 2017-11-22 IMAGING — CT CT ABD-PELV W/ CM
2 of 4 series · 16 of 46 positions shown, 18 images · IV contrast (ISOVUE 300)
Comparison: Abdomen ultrasound, 04/10/2012

CLINICAL DATA: 2 year hx of 4 episodes of LLQ pain. Some nausea.
Episodes last appx 1 week each time.

EXAM:
CT ABDOMEN AND PELVIS WITH CONTRAST
TECHNIQUE: Multidetector CT imaging of the abdomen and pelvis was performed
using the standard protocol following bolus administration of
intravenous contrast.
CONTRAST:  100mL T15BVL-MKK IOPAMIDOL (T15BVL-MKK) INJECTION 61%

[Series 2: abd/pel w · axial · 0.74mm/px · z∈[-514,-104]mm · 13 of 90 slices shown, 15 images]
[im 4/90  soft-tissue]
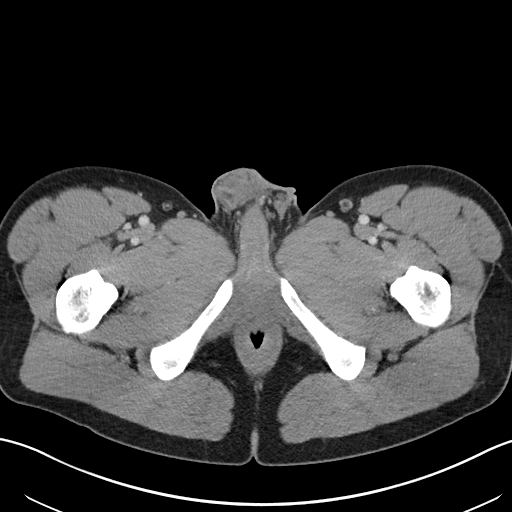
[im 4/90  bone]
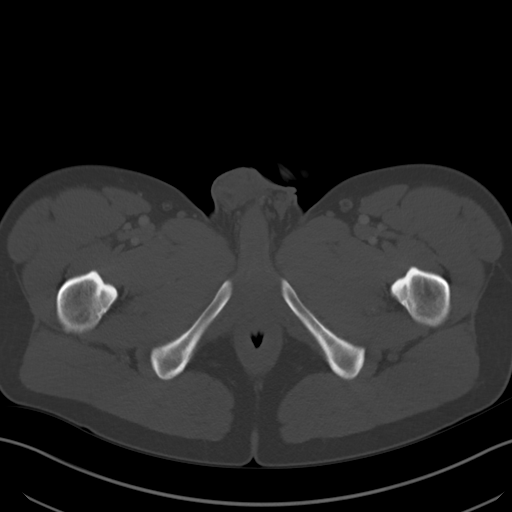
[im 12/90  soft-tissue]
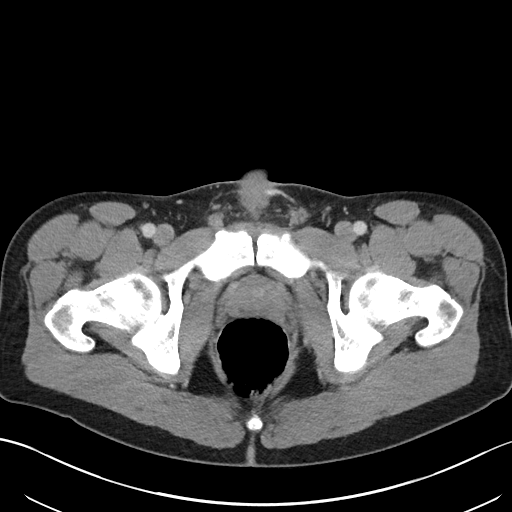
[im 19/90  soft-tissue]
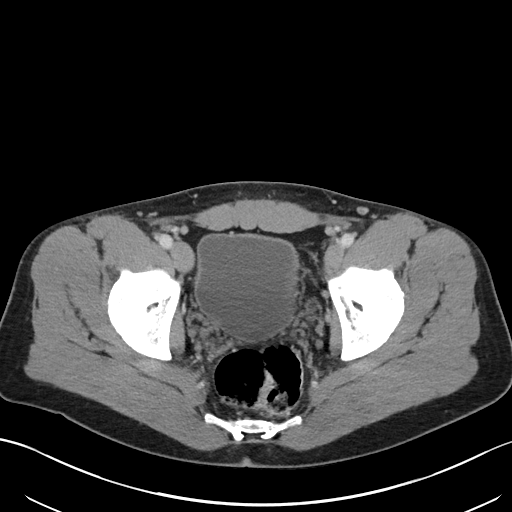
[im 26/90  soft-tissue]
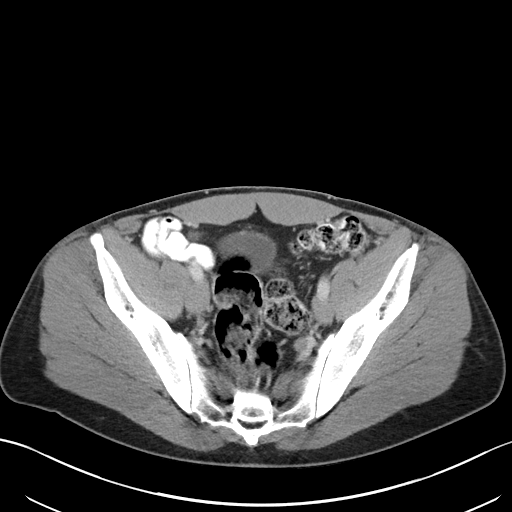
[im 30/90  soft-tissue]
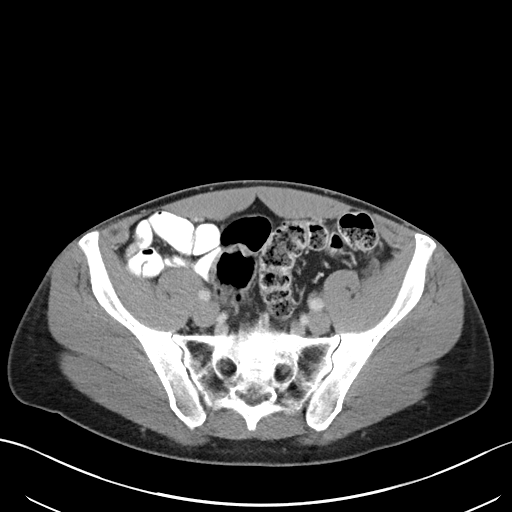
[im 38/90  soft-tissue]
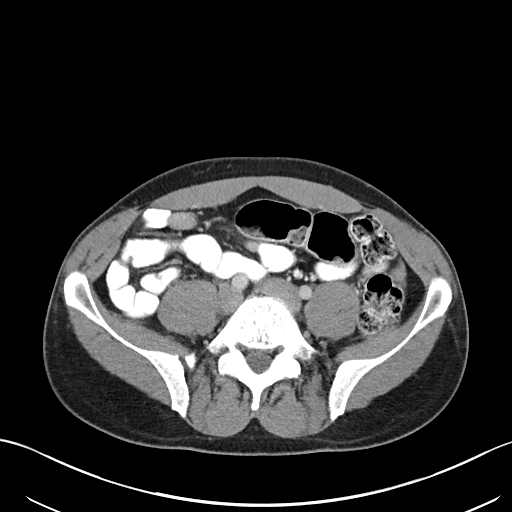
[im 45/90  soft-tissue]
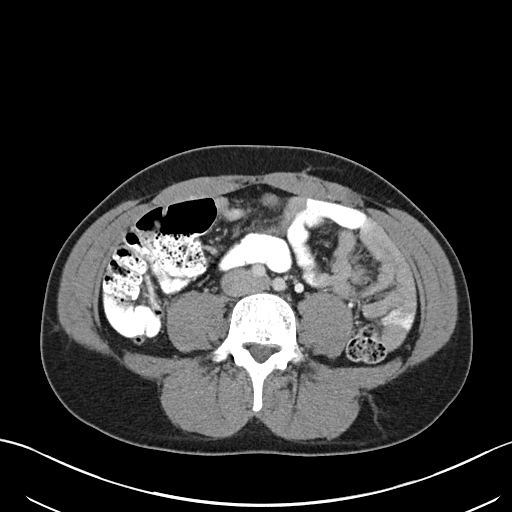
[im 52/90  soft-tissue]
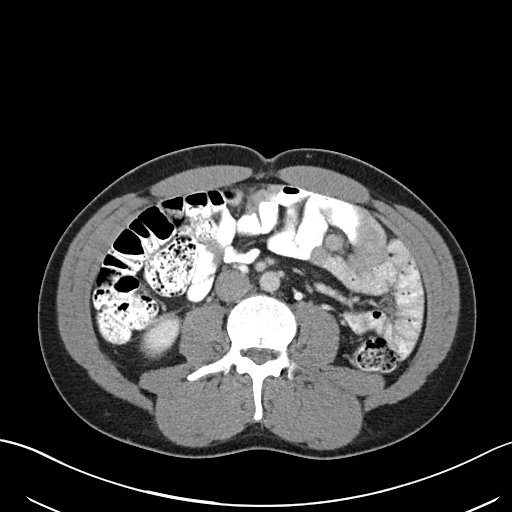
[im 60/90  soft-tissue]
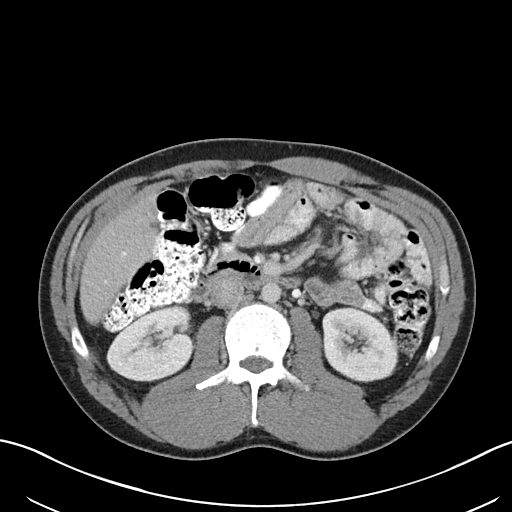
[im 60/90  bone]
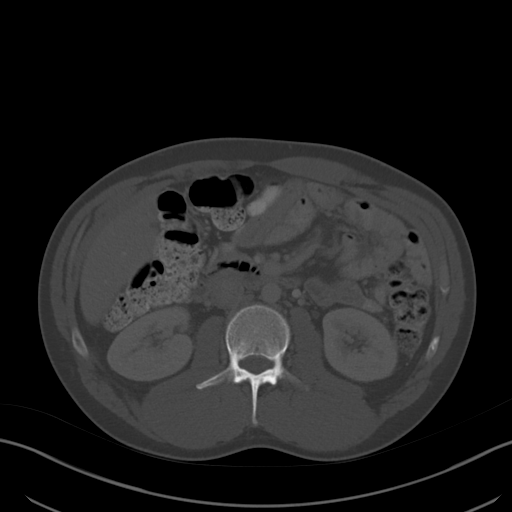
[im 64/90  soft-tissue]
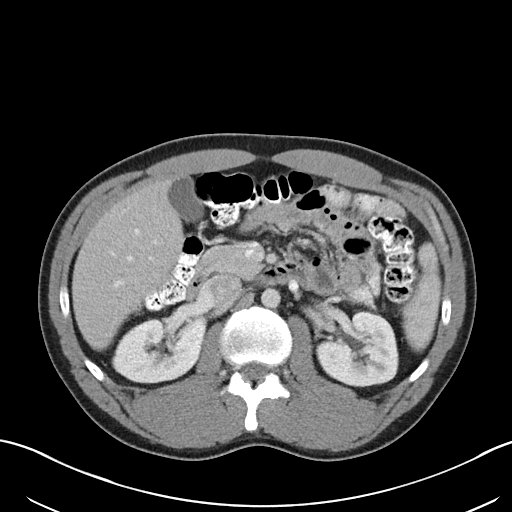
[im 71/90  soft-tissue]
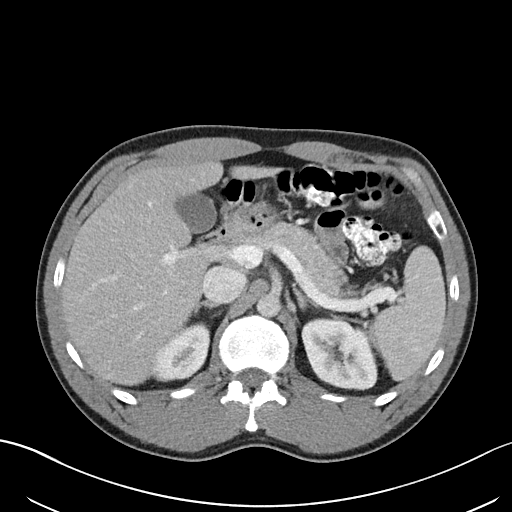
[im 78/90  soft-tissue]
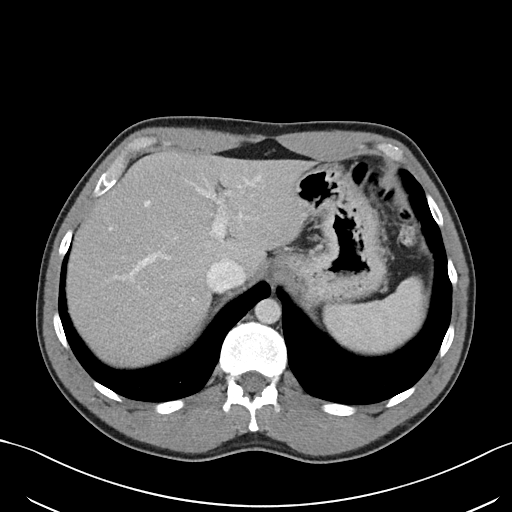
[im 86/90  soft-tissue]
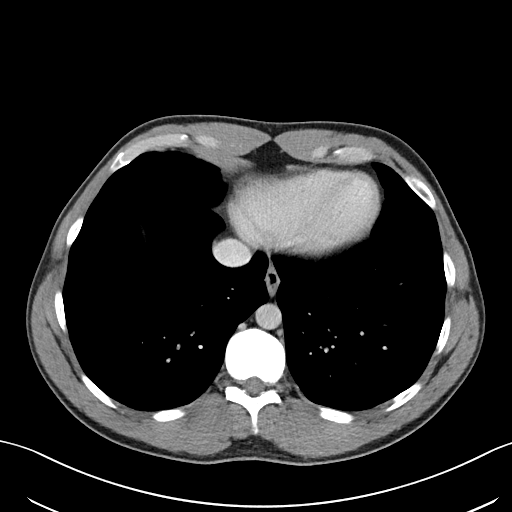

[Series 5: abd/pel w st · coronal · 0.67mm/px · 3 of 79 slices shown]
[im 27/79  soft-tissue]
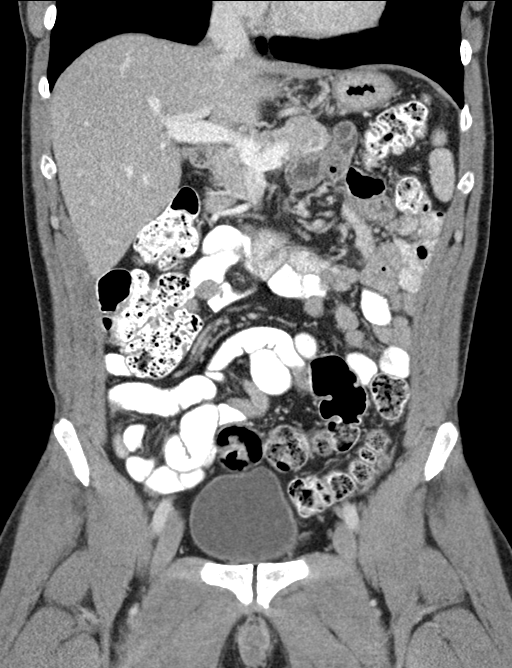
[im 35/79  soft-tissue]
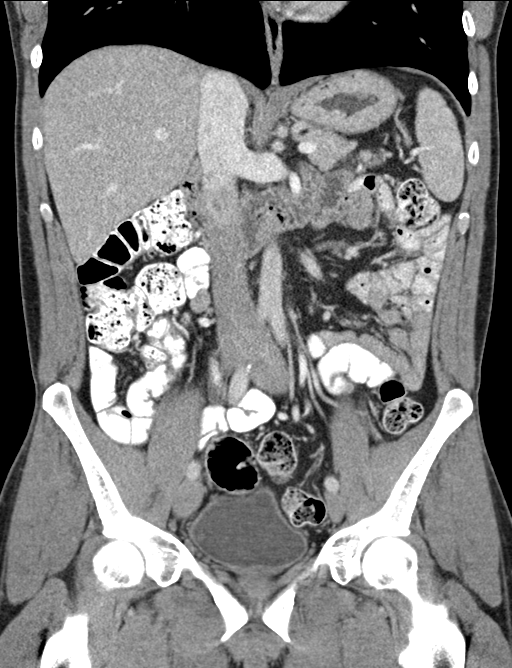
[im 44/79  soft-tissue]
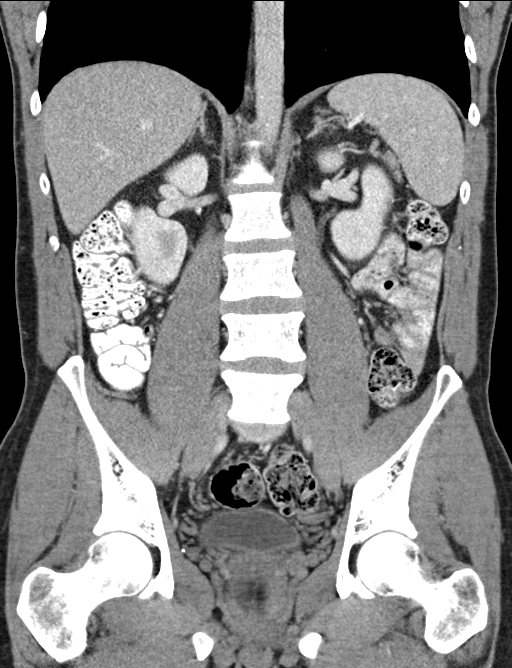

[16 of 46 positions shown; findings below may reference images not displayed]

FINDINGS: Lower chest: Clear lung bases.  Heart normal in size.

Hepatobiliary: No focal liver abnormality is seen. No gallstones,
gallbladder wall thickening, or biliary dilatation.

Pancreas: Unremarkable. No pancreatic ductal dilatation or
surrounding inflammatory changes.

Spleen: Normal in size without focal abnormality.

Adrenals/Urinary Tract: Normal adrenal glands small low-density
lesion arises from the medial upper pole the left kidney measuring 8
mm, most likely a cyst. No other renal abnormality. No
hydronephrosis. Normal ureters. Normal bladder.

Stomach/Bowel: Stomach is within normal limits. Appendix appears
normal. No evidence of bowel wall thickening, distention, or
inflammatory changes.

Vascular/Lymphatic: No significant vascular findings are present. No
enlarged abdominal or pelvic lymph nodes.

Reproductive: Prostate is unremarkable.

Other: No abdominal wall hernia or abnormality. No abdominopelvic
ascites.

Musculoskeletal: No acute or significant osseous findings.
IMPRESSION: 1. No acute findings. No findings to account for left lower quadrant
discomfort.
2. Small low-density left renal lesion, not fully characterized, but
likely a cyst. Consider follow-up evaluation with renal ultrasound.
3. No other abnormalities.

## 2017-12-22 ENCOUNTER — Ambulatory Visit: Payer: BLUE CROSS/BLUE SHIELD | Admitting: Internal Medicine

## 2018-01-08 DIAGNOSIS — E039 Hypothyroidism, unspecified: Secondary | ICD-10-CM | POA: Diagnosis not present

## 2018-01-08 DIAGNOSIS — F39 Unspecified mood [affective] disorder: Secondary | ICD-10-CM | POA: Diagnosis not present

## 2018-01-08 DIAGNOSIS — F17200 Nicotine dependence, unspecified, uncomplicated: Secondary | ICD-10-CM | POA: Diagnosis not present

## 2018-01-08 DIAGNOSIS — E559 Vitamin D deficiency, unspecified: Secondary | ICD-10-CM | POA: Diagnosis not present

## 2018-01-08 DIAGNOSIS — E291 Testicular hypofunction: Secondary | ICD-10-CM | POA: Diagnosis not present

## 2018-02-05 ENCOUNTER — Encounter: Payer: Self-pay | Admitting: Internal Medicine

## 2018-02-05 ENCOUNTER — Ambulatory Visit (INDEPENDENT_AMBULATORY_CARE_PROVIDER_SITE_OTHER): Payer: BLUE CROSS/BLUE SHIELD | Admitting: Internal Medicine

## 2018-02-05 ENCOUNTER — Other Ambulatory Visit (INDEPENDENT_AMBULATORY_CARE_PROVIDER_SITE_OTHER): Payer: BLUE CROSS/BLUE SHIELD

## 2018-02-05 VITALS — BP 130/80 | HR 73 | Temp 98.2°F | Resp 16 | Ht 70.0 in | Wt 191.0 lb

## 2018-02-05 DIAGNOSIS — N281 Cyst of kidney, acquired: Secondary | ICD-10-CM

## 2018-02-05 DIAGNOSIS — Z0000 Encounter for general adult medical examination without abnormal findings: Secondary | ICD-10-CM

## 2018-02-05 DIAGNOSIS — F418 Other specified anxiety disorders: Secondary | ICD-10-CM

## 2018-02-05 LAB — LIPID PANEL
CHOLESTEROL: 156 mg/dL (ref 0–200)
HDL: 55 mg/dL (ref >39.00)
LDL Cholesterol: 85 mg/dL (ref 0–99)
NonHDL: 100.84
Total CHOL/HDL Ratio: 3
Triglycerides: 78 mg/dL (ref 0.0–149.0)
VLDL: 15.6 mg/dL (ref 0.0–40.0)

## 2018-02-05 LAB — BASIC METABOLIC PANEL
BUN: 19 mg/dL (ref 6–23)
CHLORIDE: 104 meq/L (ref 96–112)
CO2: 28 mEq/L (ref 19–32)
Calcium: 9.2 mg/dL (ref 8.4–10.5)
Creatinine, Ser: 1.14 mg/dL (ref 0.40–1.50)
GFR: 76.53 mL/min (ref >60.00)
Glucose, Bld: 95 mg/dL (ref 70–99)
Potassium: 3.8 mEq/L (ref 3.5–5.1)
Sodium: 139 mEq/L (ref 135–145)

## 2018-02-05 LAB — URINALYSIS, ROUTINE W REFLEX MICROSCOPIC
Bilirubin Urine: NEGATIVE
Hgb urine dipstick: NEGATIVE
Ketones, ur: NEGATIVE
Leukocytes, UA: NEGATIVE
Nitrite: NEGATIVE
RBC / HPF: NONE SEEN (ref 0–?)
Specific Gravity, Urine: 1.015 (ref 1.000–1.030)
Total Protein, Urine: NEGATIVE
Urine Glucose: NEGATIVE
Urobilinogen, UA: 0.2 (ref 0.0–1.0)
WBC UA: NONE SEEN (ref 0–?)
pH: 7 (ref 5.0–8.0)

## 2018-02-05 MED ORDER — DULOXETINE HCL 30 MG PO CPEP: 30.0000 mg | ORAL_CAPSULE | Freq: Every day | ORAL | 1 refills | Status: DC

## 2018-02-05 NOTE — Progress Notes (Signed)
Subjective:  Patient ID: Jacob [name], male    DOB: 1980/10/30  Age: 38 y.o. MRN: [id number]  CC: Annual Exam   HPI Jacob [name] presents for a CPX.  He feels well today and offers no complaints.  Outpatient Medications Prior to Visit  Medication Sig Dispense Refill  . cetirizine (ZYRTEC) 10 MG tablet Take 10 mg by mouth daily.      . clomiPHENE (CLOMID) 50 MG tablet Take 50 mg by mouth. 50 mg M,W,F and 25 mg T,Th,S,Su    . esomeprazole (NEXIUM) 20 MG capsule Take 1 capsule (20 mg total) by mouth daily at 12 noon. 90 capsule 1  . thyroid (ARMOUR) 32.5 MG tablet Take 1 tablet (32.5 mg total) by mouth daily. (Patient taking differently: Take 32.5 mg by mouth daily. Pt takes 3 tablets to equal 97.5 mg) 90 tablet 1  . thyroid (ARMOUR) 65 MG tablet Take 1 tablet (65 mg total) by mouth daily. 90 tablet 1  . Diclofenac Sodium (PENNSAID) 2 % SOLN Place 1 application onto the skin 2 (two) times daily. 1 Bottle 3  . DULoxetine (CYMBALTA) 30 MG capsule Take 1 capsule (30 mg total) by mouth daily. Must keep appt for future refills 90 capsule 3  . montelukast (SINGULAIR) 10 MG tablet Take 10 mg by mouth daily.  11  . mupirocin ointment (BACTROBAN) 2 % Apply 1 application topically 2 (two) times daily. 30 g 2  . nitroGLYCERIN (NITRODUR - DOSED IN MG/24 HR) 0.2 mg/hr patch Cut and apply 1/4 patch to most painful area q24h. 30 patch 11  . NON FORMULARY [facility]  Wart Cream-  Cimetidine 2%, Deoxy-D-Glucose 0.2%, Fluorouracil-5 5%, Salicylic Acid 38% Apply 1-2 grams to affected area 3-4 times daily Qty. 120 gm 3 refills     No facility-administered medications prior to visit.     ROS Review of Systems  Constitutional: Negative for diaphoresis, fatigue and unexpected weight change.  HENT: Negative.   Eyes: Negative for visual disturbance.  Respiratory: Negative for cough, chest tightness, shortness of breath and wheezing.   Cardiovascular: Negative for chest pain,  palpitations and leg swelling.  Gastrointestinal: Negative for abdominal pain, constipation, diarrhea, nausea and vomiting.  Endocrine: Negative.   Genitourinary: Negative.  Negative for difficulty urinating, dysuria, scrotal swelling, testicular pain and urgency.  Musculoskeletal: Negative.  Negative for arthralgias and myalgias.  Skin: Negative.   Allergic/Immunologic: Negative.   Neurological: Negative.  Negative for dizziness, weakness and headaches.  Hematological: Negative for adenopathy. Does not bruise/bleed easily.  Psychiatric/Behavioral: Negative.     Objective:  BP 130/80 (BP Location: Left Arm, Patient Position: Sitting, Cuff Size: Normal)   Pulse 73   Temp 98.2 F (36.8 C) (Oral)   Resp 16   Ht 5\' 10"  (1.778 m)   Wt 191 lb (86.6 kg)   SpO2 98%   BMI 27.41 kg/m   BP Readings from Last 3 Encounters:  02/05/18 130/80  05/01/17 122/66  04/08/17 132/64    Wt Readings from Last 3 Encounters:  02/05/18 191 lb (86.6 kg)  05/01/17 191 lb (86.6 kg)  04/08/17 191 lb (86.6 kg)    Physical Exam Vitals signs reviewed.  HENT:     Nose: No congestion or rhinorrhea.     Mouth/Throat:     Mouth: Mucous membranes are moist.     Pharynx: Oropharynx is clear. No oropharyngeal exudate or posterior oropharyngeal erythema.  Eyes:     General: No scleral icterus.    Conjunctiva/sclera:  Conjunctivae normal.  Neck:     Musculoskeletal: Normal range of motion and neck supple. No muscular tenderness.  Cardiovascular:     Rate and Rhythm: Normal rate and regular rhythm.     Heart sounds: No murmur. No gallop.   Pulmonary:     Effort: Pulmonary effort is normal.     Breath sounds: No stridor. No wheezing, rhonchi or rales.  Abdominal:     General: Abdomen is flat.     Palpations: There is no hepatomegaly, splenomegaly or mass.     Tenderness: There is no abdominal tenderness. There is no guarding.     Hernia: No hernia is present.  Musculoskeletal: Normal range of motion.         General: No swelling.     Right lower leg: No edema.     Left lower leg: No edema.  Lymphadenopathy:     Cervical: No cervical adenopathy.  Skin:    General: Skin is warm.     Coloration: Skin is not pale.     Findings: No rash.  Neurological:     General: No focal deficit present.     Mental Status: He is oriented to person, place, and time. Mental status is at baseline.  Psychiatric:        Mood and Affect: Mood normal.        Behavior: Behavior normal.        Thought Content: Thought content normal.        Judgment: Judgment normal.     Lab Results  Component Value Date   WBC 8.6 01/27/2017   HGB 16.6 01/27/2017   HCT 49.3 01/27/2017   PLT 229.0 01/27/2017   GLUCOSE 95 02/05/2018   CHOL 156 02/05/2018   TRIG 78.0 02/05/2018   HDL 55.00 02/05/2018   LDLCALC 85 02/05/2018   ALT 18 10/14/2016   AST 25 10/14/2016   NA 139 02/05/2018   K 3.8 02/05/2018   CL 104 02/05/2018   CREATININE 1.14 02/05/2018   BUN 19 02/05/2018   CO2 28 02/05/2018   TSH 1.89 01/27/2017   HGBA1C 5.3 10/14/2016    Mr Jacob [name] Head Wo Contrast  Result Date: 11/27/2016 CLINICAL DATA:  Family history of aneurysm. EXAM: MRA HEAD WITHOUT CONTRAST TECHNIQUE: Angiographic images of the Circle of Willis were obtained using MRA technique without intravenous contrast. COMPARISON:  None. FINDINGS: Intracranial internal carotid arteries: Normal. Anterior cerebral arteries: Normal. Middle cerebral arteries: Normal. Posterior communicating arteries: Absent bilaterally. Posterior cerebral arteries: Normal. Basilar artery: Normal. Vertebral arteries: Left dominant. Normal. Superior cerebellar arteries: Normal. Anterior inferior cerebellar arteries: Normal. Posterior inferior cerebellar arteries: Normal. IMPRESSION: Normal intracranial MRA.  No aneurysm. Electronically Signed   By: Jacob [name] M.D.   On: 11/27/2016 22:22    Assessment & Plan:   Jacob [name] was seen today for annual exam.  Diagnoses and all  orders for this visit:  Routine general medical examination at a health care facility- Exam completed, labs reviewed, vaccines reviewed, patient education material was given. -     Lipid panel; Future  Renal cyst, left- No complications related to this. -     Urinalysis, Routine w reflex microscopic; Future -     Basic metabolic panel; Future  Depression with anxiety- He is doing well on the current dose of duloxetine.  Will continue. -     DULoxetine (CYMBALTA) 30 MG capsule; Take 1 capsule (30 mg total) by mouth daily. Must keep appt for future refills   I  have discontinued Barret Esquivel. Tung's montelukast, nitroGLYCERIN, Diclofenac Sodium, mupirocin ointment, and NON FORMULARY. I am also having him maintain his cetirizine, clomiPHENE, esomeprazole, thyroid, thyroid, and DULoxetine.  Meds ordered this encounter  Medications  . DULoxetine (CYMBALTA) 30 MG capsule    Sig: Take 1 capsule (30 mg total) by mouth daily. Must keep appt for future refills    Dispense:  90 capsule    Refill:  1     Follow-up: No follow-ups on file.  Scarlette Calico, MD

## 2018-02-09 ENCOUNTER — Encounter: Payer: Self-pay | Admitting: Internal Medicine

## 2018-02-09 NOTE — Patient Instructions (Signed)

## 2018-02-27 ENCOUNTER — Ambulatory Visit: Payer: BLUE CROSS/BLUE SHIELD | Admitting: Nurse Practitioner

## 2018-03-19 ENCOUNTER — Ambulatory Visit: Payer: BLUE CROSS/BLUE SHIELD | Admitting: Podiatry

## 2018-03-24 ENCOUNTER — Ambulatory Visit: Payer: BLUE CROSS/BLUE SHIELD | Admitting: Podiatry

## 2018-03-27 ENCOUNTER — Ambulatory Visit (INDEPENDENT_AMBULATORY_CARE_PROVIDER_SITE_OTHER): Payer: BLUE CROSS/BLUE SHIELD | Admitting: Podiatry

## 2018-03-27 DIAGNOSIS — B070 Plantar wart: Secondary | ICD-10-CM

## 2018-03-30 NOTE — Progress Notes (Signed)
Subjective: 38 year old male presents the office today for concerns of cold wart on his right forefoot which is been ongoing for the last several months.  Did get better after I saw him however started to come back he has been self treating this at home.  He denies any significant pain to the area he denies any recent redness or drainage or any swelling. Denies any systemic complaints such as fevers, chills, nausea, vomiting. No acute changes since last appointment, and no other complaints at this time.   Objective: AAO x3, NAD DP/PT pulses palpable bilaterally, CRT less than 3 seconds Hyperkeratotic lesion with evidence of verruca submetatarsal area on the right foot.  Overall appears to be better than when I last saw him however it has continued.  There is no skin edema, erythema, drainage or pus or any signs of infection. No open lesions or pre-ulcerative lesions.  No pain with calf compression, swelling, warmth, erythema  Assessment: Right foot verruca  Plan: -All treatment options discussed with the patient including all alternatives, risks, complications.  -The lesion was sharply debrided with a #312 with scalpel without any complications or bleeding.  The areas cleaned with alcohol.  Today under standard precautions we lasered the wart.  We will see what his reaction to the laser is in the next couple of days and then we will continue with the compound cream which I reordered today through [facility]. -Follow-up in 1 month or sooner if needed. -Patient encouraged to call the office with any questions, concerns, change in symptoms.   Trula Slade DPM

## 2018-03-31 ENCOUNTER — Telehealth: Payer: Self-pay | Admitting: Podiatry

## 2018-04-01 ENCOUNTER — Ambulatory Visit: Payer: BLUE CROSS/BLUE SHIELD | Admitting: Nurse Practitioner

## 2018-04-06 DIAGNOSIS — E291 Testicular hypofunction: Secondary | ICD-10-CM | POA: Diagnosis not present

## 2018-04-06 DIAGNOSIS — E039 Hypothyroidism, unspecified: Secondary | ICD-10-CM | POA: Diagnosis not present

## 2018-04-06 DIAGNOSIS — F17200 Nicotine dependence, unspecified, uncomplicated: Secondary | ICD-10-CM | POA: Diagnosis not present

## 2018-04-06 DIAGNOSIS — E559 Vitamin D deficiency, unspecified: Secondary | ICD-10-CM | POA: Diagnosis not present

## 2018-04-06 DIAGNOSIS — F39 Unspecified mood [affective] disorder: Secondary | ICD-10-CM | POA: Diagnosis not present

## 2018-04-15 DIAGNOSIS — E039 Hypothyroidism, unspecified: Secondary | ICD-10-CM | POA: Diagnosis not present

## 2018-04-15 DIAGNOSIS — R5383 Other fatigue: Secondary | ICD-10-CM | POA: Diagnosis not present

## 2018-04-15 DIAGNOSIS — J309 Allergic rhinitis, unspecified: Secondary | ICD-10-CM | POA: Diagnosis not present

## 2018-04-15 DIAGNOSIS — R7989 Other specified abnormal findings of blood chemistry: Secondary | ICD-10-CM | POA: Diagnosis not present

## 2018-05-19 ENCOUNTER — Encounter: Payer: Self-pay | Admitting: Internal Medicine

## 2018-05-20 DIAGNOSIS — L819 Disorder of pigmentation, unspecified: Secondary | ICD-10-CM | POA: Diagnosis not present

## 2018-05-22 DIAGNOSIS — Z20828 Contact with and (suspected) exposure to other viral communicable diseases: Secondary | ICD-10-CM | POA: Diagnosis not present

## 2018-06-08 ENCOUNTER — Encounter: Payer: Self-pay | Admitting: Internal Medicine

## 2018-06-10 DIAGNOSIS — E039 Hypothyroidism, unspecified: Secondary | ICD-10-CM | POA: Diagnosis not present

## 2018-06-10 DIAGNOSIS — E291 Testicular hypofunction: Secondary | ICD-10-CM | POA: Diagnosis not present

## 2018-06-10 DIAGNOSIS — F17200 Nicotine dependence, unspecified, uncomplicated: Secondary | ICD-10-CM | POA: Diagnosis not present

## 2018-06-10 DIAGNOSIS — F39 Unspecified mood [affective] disorder: Secondary | ICD-10-CM | POA: Diagnosis not present

## 2018-06-10 DIAGNOSIS — E559 Vitamin D deficiency, unspecified: Secondary | ICD-10-CM | POA: Diagnosis not present

## 2018-07-08 ENCOUNTER — Other Ambulatory Visit: Payer: Self-pay | Admitting: Internal Medicine

## 2018-07-08 DIAGNOSIS — F418 Other specified anxiety disorders: Secondary | ICD-10-CM

## 2018-07-14 DIAGNOSIS — J301 Allergic rhinitis due to pollen: Secondary | ICD-10-CM | POA: Diagnosis not present

## 2018-08-11 DIAGNOSIS — Z20828 Contact with and (suspected) exposure to other viral communicable diseases: Secondary | ICD-10-CM | POA: Diagnosis not present

## 2018-08-18 DIAGNOSIS — E538 Deficiency of other specified B group vitamins: Secondary | ICD-10-CM | POA: Diagnosis not present

## 2018-08-18 DIAGNOSIS — R6882 Decreased libido: Secondary | ICD-10-CM | POA: Diagnosis not present

## 2018-08-18 DIAGNOSIS — E039 Hypothyroidism, unspecified: Secondary | ICD-10-CM | POA: Diagnosis not present

## 2018-08-18 DIAGNOSIS — E559 Vitamin D deficiency, unspecified: Secondary | ICD-10-CM | POA: Diagnosis not present

## 2018-08-18 DIAGNOSIS — R5383 Other fatigue: Secondary | ICD-10-CM | POA: Diagnosis not present

## 2018-09-15 ENCOUNTER — Encounter: Payer: Self-pay | Admitting: Orthopaedic Surgery

## 2018-09-15 ENCOUNTER — Ambulatory Visit: Payer: BC Managed Care – PPO | Admitting: Orthopaedic Surgery

## 2018-09-15 ENCOUNTER — Ambulatory Visit: Payer: Self-pay

## 2018-09-15 ENCOUNTER — Ambulatory Visit (INDEPENDENT_AMBULATORY_CARE_PROVIDER_SITE_OTHER): Payer: BC Managed Care – PPO | Admitting: Orthopaedic Surgery

## 2018-09-15 DIAGNOSIS — B070 Plantar wart: Secondary | ICD-10-CM | POA: Diagnosis not present

## 2018-09-15 DIAGNOSIS — M25521 Pain in right elbow: Secondary | ICD-10-CM

## 2018-09-15 DIAGNOSIS — M7711 Lateral epicondylitis, right elbow: Secondary | ICD-10-CM

## 2018-09-15 DIAGNOSIS — L309 Dermatitis, unspecified: Secondary | ICD-10-CM | POA: Diagnosis not present

## 2018-09-15 NOTE — Progress Notes (Signed)
Office Visit Note   Patient: Jacob [name]           Date of Birth: Feb 07, 1980           MRN: [id number] Visit Date: 09/15/2018              Requested by: Janith Lima, MD [address] [place],  [place] [address] PCP: Janith Lima, MD   Assessment & Plan: Visit Diagnoses:  1. Lateral epicondylitis of right elbow     Plan: Impression is right tennis elbow doubt radial tunnel syndrome.  Unfortunately, rest, stretching, NSAIDs have been unsuccessful in alleviating symptoms during physical.  Therefore we will obtain MRI of elbow to rule out structural abnormalities.  He will follow-up with [place] once that has been completed.  He does not have pain at rest or ADLs therefore will hold off on injection for now.  Call with concerns or questions anytime. Total face to face encounter time was greater than 45 minutes and over half of this time was spent in counseling and/or coordination of care.  Follow-Up Instructions: Return for after MRI.   Orders:  Orders Placed This Encounter  Procedures  . XR Elbow Complete Right (3+View)   No orders of the defined types were placed in this encounter.     Procedures: No procedures performed   Clinical Data: No additional findings.   Subjective: Chief Complaint  Patient presents with  . Right Elbow - Pain    HPI patient is a pleasant right-hand-dominant gentleman who presents our clinic today with recurrent right elbow pain.  History of right tennis elbow dating back at least 3 years.  He was initially injected with cortisone by his father-in-law.  He does note that this appeared to help until April 2019 when his pain returned.  No specific injury but his pain did appear to worsen while playing tennis.  At that point, he was seen by an orthopedist who prescribed nitroglycerin patches and stretches.  Hiatt stopped playing tennis at that time as well.  His pain did subside until recently.  His pain has returned as he is  returned to playing tennis 2 days a week.  He has tried loosening his strings without relief of symptoms.  Review of Systems as detailed in HPI.  All others reviewed and are negative.   Objective: Vital Signs: There were no vitals taken for this visit.  Physical Exam well-developed well-nourished gentleman in no acute distress.  Alert and oriented x3.  Ortho Exam examination of his right elbow reveals moderate tenderness over the lateral epicondyle.  Moderate tenderness over the radial tunnel.  He does have slight tenderness to the olecranon fossa.  He does not have pain with grip, elbow supination, pronation or wrist extension.  He is neurovascularly intact distally.  Specialty Comments:  No specialty comments available.  Imaging: Xr Elbow Complete Right (3+view)  Result Date: 09/15/2018 No acute or structural abnormalities    PMFS History: Patient Active Problem List   Diagnosis Date Noted  . Lateral epicondylitis of right elbow 05/01/2017  . Family history of brain aneurysm 11/05/2016  . Renal cyst, left 03/24/2016  . Routine general medical examination at a health care facility 11/22/2015  . Nonallopathic lesion of sacral region 06/22/2014  . Hypothyroid    Past Medical History:  Diagnosis Date  . Anxiety   . 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 Depression   . Genital warts   . GERD (gastroesophageal reflux disease)    w/ HH  . H/O alcohol abuse    sober since 02/04/2007  . Hypothyroid     Family History  Problem Relation Age of Onset  . Heart disease Father 90  . Hyperlipidemia Father   . Hypertension Father   . Diabetes Sister 75    Past Surgical History:  Procedure Laterality Date  . WRIST SURGERY     left   Social History   Occupational History    Employer: [employer]  Tobacco Use  . Smoking status: Former Smoker    Types: Cigarettes    Quit date: 03/28/2008    Years  since quitting: 10.4  . Smokeless tobacco: Never Used  . Tobacco comment: using lozenges  Substance and Sexual Activity  . Alcohol use: No  . Drug use: No  . Sexual activity: Not on file

## 2018-09-16 ENCOUNTER — Other Ambulatory Visit: Payer: BLUE CROSS/BLUE SHIELD | Admitting: Family Medicine

## 2018-09-17 ENCOUNTER — Other Ambulatory Visit: Payer: Self-pay | Admitting: *Deleted

## 2018-09-22 DIAGNOSIS — M25421 Effusion, right elbow: Secondary | ICD-10-CM | POA: Diagnosis not present

## 2018-09-22 DIAGNOSIS — M67921 Unspecified disorder of synovium and tendon, right upper arm: Secondary | ICD-10-CM | POA: Diagnosis not present

## 2018-09-22 DIAGNOSIS — M7711 Lateral epicondylitis, right elbow: Secondary | ICD-10-CM | POA: Diagnosis not present

## 2018-09-22 DIAGNOSIS — M24221 Disorder of ligament, right elbow: Secondary | ICD-10-CM | POA: Diagnosis not present

## 2018-09-24 ENCOUNTER — Ambulatory Visit (INDEPENDENT_AMBULATORY_CARE_PROVIDER_SITE_OTHER): Payer: BC Managed Care – PPO | Admitting: Orthopaedic Surgery

## 2018-09-24 DIAGNOSIS — M7711 Lateral epicondylitis, right elbow: Secondary | ICD-10-CM | POA: Diagnosis not present

## 2018-09-24 NOTE — Progress Notes (Signed)
   Office Visit Note   Patient: Jacob [name]           Date of Birth: [birth year]           MRN: [id number] Visit Date: 09/24/2018              Requested by: Janith Lima, MD [address],  [place] [address] PCP: Janith Lima, MD   Assessment & Plan: Visit Diagnoses:  1. Lateral epicondylitis of right elbow     Plan: MRI findings were reviewed with the patient and mainly shows mild extensor tendinosis and some degenerative changes of the proximal LUCL.  We reviewed these findings in detail and due to the fact that the pain is severe and does not significantly limit his ADLs I would recommend physical therapy with modalities.  Does not sound like he has constant pain therefore I do not feel strongly about a cortisone injection.  I feel this is a good chance of healing on its own without needing surgical intervention.  Questions encouraged and answered.  Follow-up as needed.  Follow-Up Instructions: Return if symptoms worsen or fail to improve.   Orders:  No orders of the defined types were placed in this encounter.  No orders of the defined types were placed in this encounter.     Procedures: No procedures performed   Clinical Data: No additional findings.   Subjective: Chief Complaint  Patient presents with  . Right Elbow - Follow-up    [name] returns today for MRI review of his right elbow.  His symptoms and been unchanged.   Review of Systems   Objective: Vital Signs: There were no vitals taken for this visit.  Physical Exam  Ortho Exam Exam is unchanged. Specialty Comments:  No specialty comments available.  Imaging: No results found.   PMFS History: Patient Active Problem List   Diagnosis Date Noted  . Lateral epicondylitis of right elbow 05/01/2017  . Family history of brain aneurysm 11/05/2016  . Renal cyst, left 03/24/2016  . Routine general medical examination at a health care facility 11/22/2015  .  Nonallopathic lesion of sacral region 06/22/2014  . Hypothyroid    Past Medical History:  Diagnosis Date  . Anxiety   . 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 Depression   . Genital warts   . GERD (gastroesophageal reflux disease)    w/ HH  . H/O alcohol abuse    sober since 02/04/2007  . Hypothyroid     Family History  Problem Relation Age of Onset  . Heart disease Father 47  . Hyperlipidemia Father   . Hypertension Father   . Diabetes Sister 32    Past Surgical History:  Procedure Laterality Date  . WRIST SURGERY     left   Social History   Occupational History    Employer: [employer]  Tobacco Use  . Smoking status: Former Smoker    Types: Cigarettes    Quit date: 03/28/2008    Years since quitting: 10.4  . Smokeless tobacco: Never Used  . Tobacco comment: using lozenges  Substance and Sexual Activity  . Alcohol use: No  . Drug use: No  . Sexual activity: Not on file

## 2018-10-27 DIAGNOSIS — E039 Hypothyroidism, unspecified: Secondary | ICD-10-CM | POA: Diagnosis not present

## 2018-10-27 DIAGNOSIS — F17200 Nicotine dependence, unspecified, uncomplicated: Secondary | ICD-10-CM | POA: Diagnosis not present

## 2018-10-27 DIAGNOSIS — R5383 Other fatigue: Secondary | ICD-10-CM | POA: Diagnosis not present

## 2018-10-27 DIAGNOSIS — J309 Allergic rhinitis, unspecified: Secondary | ICD-10-CM | POA: Diagnosis not present

## 2018-11-04 ENCOUNTER — Ambulatory Visit: Payer: BC Managed Care – PPO | Admitting: Internal Medicine

## 2018-12-08 DIAGNOSIS — Z20828 Contact with and (suspected) exposure to other viral communicable diseases: Secondary | ICD-10-CM | POA: Diagnosis not present

## 2019-01-19 ENCOUNTER — Encounter: Payer: Self-pay | Admitting: *Deleted

## 2019-01-19 ENCOUNTER — Telehealth: Payer: Self-pay | Admitting: *Deleted

## 2019-01-19 NOTE — Telephone Encounter (Signed)
Pt will be a new virtual visit pt, self-referral, to see Dr. Meda Coffee on 03/03/19 at 1020.  Pt is self referred for cardiac evaluation, and family history of cardiac issues.

## 2019-01-19 NOTE — Telephone Encounter (Signed)
Virtual Visit Pre-Appointment Phone Call  "(Name), I am calling you today to discuss your upcoming appointment. We are currently trying to limit exposure to the virus that causes COVID-19 by seeing patients at home rather than in the office."  1. "What is the BEST phone number to call the day of the visit?" - include this in appointment notes-YES UPDATED  2. "Do you have or have access to (through a family member/friend) a smartphone with video capability that we can use for your visit?" a. If yes - list this number in appt notes as "cell" (if different from BEST phone #) and list the appointment type as a VIDEO visit in appointment notes-YES UPDATED   3. Confirm consent - "In the setting of the current Covid19 crisis, you are scheduled for a ( video) visit with Dr. Meda Coffee on 03/03/19 at 1020.  Just as we do with many in-office visits, in order for you to participate in this visit, we must obtain consent.  If you'd like, I can send this to your mychart (if signed up) or email for you to review.  Otherwise, I can obtain your verbal consent now.  All virtual visits are billed to your insurance company just like a normal visit would be.  By agreeing to a virtual visit, we'd like you to understand that the technology does not allow for your provider to perform an examination, and thus may limit your provider's ability to fully assess your condition. If your provider identifies any concerns that need to be evaluated in person, we will make arrangements to do so.  Finally, though the technology is pretty good, we cannot assure that it will always work on either your or our end, and in the setting of a video visit, we may have to convert it to a phone-only visit.  In either situation, we cannot ensure that we have a secure connection.  Are you willing to proceed?" STAFF: Did the patient verbally acknowledge consent to telehealth visit? Document YES/NO here: YES CONSENT TO TREAT SENT TO THE PTS ACTIVE MYCHART  ACCOUNT TO REVIEW.   4. Advise patient to be prepared - "Two hours prior to your appointment, go ahead and check your blood pressure, pulse, oxygen saturation, and your weight (if you have the equipment to check those) and write them all down. When your visit starts, your provider will ask you for this information. If you have an Apple Watch or Kardia device, please plan to have heart rate information ready on the day of your appointment. Please have a pen and paper handy nearby the day of the visit as well."-AWARE  5. Give patient instructions for [email or web address].me as below if video visit (depending on what platform provider is using)-YES [place]  6. Inform patient they will receive a phone call 15 minutes prior to their appointment time (may be from unknown caller ID) so they should be prepared to [place] NOTE  Jacob [name] has been deemed a candidate for a follow-up tele-health visit to limit community exposure during the Covid-19 pandemic. I spoke with the patient via phone to ensure availability of phone/video source, confirm preferred email & phone number, and discuss instructions and expectations.  I reminded Jacob [name] to be prepared with any vital sign and/or heart rhythm information that could potentially be obtained via home monitoring, at the time of his visit. I reminded Jacob [name] to expect a phone call  prior to his visit.  Nuala Alpha, LPN [id number]    IF USING  [email or web address] - The patient will receive a link just prior to their visit by text.-YES     FULL LENGTH CONSENT FOR TELE-HEALTH VISIT   I hereby voluntarily request, consent and authorize [facility] and its employed or contracted physicians, physician assistants, nurse practitioners or other licensed health care professionals (the Practitioner), to provide me with telemedicine health care services (the "Services") as deemed necessary by the  treating Practitioner. I acknowledge and consent to receive the Services by the Practitioner via telemedicine. I understand that the telemedicine visit will involve communicating with the Practitioner through live audiovisual communication technology and the disclosure of certain medical information by electronic transmission. I acknowledge that I have been given the opportunity to request an in-person assessment or other available alternative prior to the telemedicine visit and am voluntarily participating in the telemedicine visit.  I understand that I have the right to withhold or withdraw my consent to the use of telemedicine in the course of my care at any time, without affecting my right to future care or treatment, and that the Practitioner or I may terminate the telemedicine visit at any time. I understand that I have the right to inspect all information obtained and/or recorded in the course of the telemedicine visit and may receive copies of available information for a reasonable fee.  I understand that some of the potential risks of receiving the Services via telemedicine include:  Marland Kitchen Delay or interruption in medical evaluation due to technological equipment failure or disruption; . Information transmitted may not be sufficient (e.g. poor resolution of images) to allow for appropriate medical decision making by the Practitioner; and/or  . In rare instances, security protocols could fail, causing a breach of personal health information.  Furthermore, I acknowledge that it is my responsibility to provide information about my medical history, conditions and care that is complete and accurate to the best of my ability. I acknowledge that Practitioner's advice, recommendations, and/or decision may be based on factors not within their control, such as incomplete or inaccurate data provided by me or distortions of diagnostic images or specimens that may result from electronic transmissions. I understand  that the practice of medicine is not an exact science and that Practitioner makes no warranties or guarantees regarding treatment outcomes. I acknowledge that I will receive a copy of this consent concurrently upon execution via email to the email address I last provided but may also request a printed copy by calling the office of [facility].    I understand that my insurance will be billed for this visit.   I have read or had this consent read to me. . I understand the contents of this consent, which adequately explains the benefits and risks of the Services being provided via telemedicine.  . I have been provided ample opportunity to ask questions regarding this consent and the Services and have had my questions answered to my satisfaction. . I give my informed consent for the services to be provided through the use of telemedicine in my medical care  By participating in this telemedicine visit I agree to the above.  CONSENT FOR DR. Meda Coffee TO TREAT THIS PT VIA VIDEO VISIT WAS SENT TO HIS ACTIVE MYCHART ACCOUNT TO REVIEW PRIOR TO HIS VIRTUAL VISIT ON 03/03/19 AT 1020

## 2019-01-25 ENCOUNTER — Encounter: Payer: Self-pay | Admitting: Internal Medicine

## 2019-01-25 NOTE — Telephone Encounter (Signed)
Spoke to patient. Appointment scheduled.

## 2019-02-04 ENCOUNTER — Ambulatory Visit: Payer: Self-pay

## 2019-02-04 ENCOUNTER — Ambulatory Visit (INDEPENDENT_AMBULATORY_CARE_PROVIDER_SITE_OTHER): Payer: BC Managed Care – PPO | Admitting: Family Medicine

## 2019-02-04 ENCOUNTER — Other Ambulatory Visit: Payer: Self-pay

## 2019-02-04 ENCOUNTER — Encounter: Payer: Self-pay | Admitting: Family Medicine

## 2019-02-04 VITALS — BP 132/88 | HR 85 | Ht 70.0 in | Wt 193.8 lb

## 2019-02-04 DIAGNOSIS — M25421 Effusion, right elbow: Secondary | ICD-10-CM

## 2019-02-04 DIAGNOSIS — G8929 Other chronic pain: Secondary | ICD-10-CM | POA: Diagnosis not present

## 2019-02-04 DIAGNOSIS — M25521 Pain in right elbow: Secondary | ICD-10-CM

## 2019-02-04 DIAGNOSIS — M7521 Bicipital tendinitis, right shoulder: Secondary | ICD-10-CM

## 2019-02-04 DIAGNOSIS — M7711 Lateral epicondylitis, right elbow: Secondary | ICD-10-CM

## 2019-02-04 NOTE — Patient Instructions (Signed)
Thank you for coming in today. Call or go to the ER if you develop a large red swollen joint with extreme pain or oozing puss.  Do the exercises.  Recheck in about 1 month.  Return sooner if needed.

## 2019-02-04 NOTE — Progress Notes (Signed)
Subjective:    I'm seeing this patient as a consultation for:  Dr. Ronnald Ramp. Note will be routed back to referring provider/PCP.  CC: R elbow pain  I, Molly Weber, LAT, ATC, am serving as scribe for [name].  HPI: Pt is a 39 y/o male presenting w/ chronic R elbow pain x approximately 3 years.  He has seen several providers for his symptoms including his PCP, Dr Erlinda Hong at [facility] and a relative who is a physician.  He had a corticosteriod injection a few years ago which helped his symptoms until April 2019 when his symptoms gradually returned.  He has tried a HEP and nitro patches.  He has had a R elbow XR on 09/15/18 and an elbow MRI in late August-early September 2020.  Since his last visit w/ Dr. Erlinda Hong on 09/24/18, pt reports that he is not having much pain currently but con't to have pain w/ tennis.  He states that he wants to talk to Dr. Georgina Snell about a corticosteroid injection and his thoughts on that as a treatment option.  His pain is located both proximal and distal to the elbow.    Past medical history, Surgical history, Family history, Social history, Allergies, and medications have been entered into the medical record, reviewed.   Review of Systems: No new headache, visual changes, nausea, vomiting, diarrhea, constipation, dizziness, abdominal pain, skin rash, fevers, chills, night sweats, weight loss, swollen lymph nodes, body aches, joint swelling, muscle aches, chest pain, shortness of breath, mood changes, visual or auditory hallucinations.   Objective:    Vitals:   02/04/19 1602  BP: 132/88  Pulse: 85  SpO2: 96%   General: Well Developed, well nourished, and in no acute distress.  Neuro/Psych: Alert and oriented x3, extra-ocular muscles intact, able to move all 4 extremities, sensation grossly intact. Skin: Warm and dry, no rashes noted.  Respiratory: Not using accessory muscles, speaking in full sentences, trachea midline.  Cardiovascular: Pulses palpable, no  extremity edema. Abdomen: Does not appear distended. MSK:  Right elbow: Normal-appearing with no significant swelling deformity or erythema. Full range of motion flexion extension pronation supination. Mildly tender palpation lateral epicondyle.  Mildly tender also to palpation at distal biceps tendon. Intact strength to flexion extension pronation supination.  Some pain with resisted elbow supination and flexion felt at distal biceps tendon. Some pain also present with resisted wrist and finger extension found at lateral epicondyle No significant laxity to UCL stress testing.  Pulses cap refill and sensation are intact distally.  Lab and Radiology Results  Procedure: Real-time Ultrasound Guided Injection of right lateral epicondyle Device: Philips Affiniti 50G Images permanently stored and available for review in the ultrasound unit. Verbal informed consent obtained.  Discussed risks and benefits of procedure. Warned about infection bleeding damage to structures skin hypopigmentation and fat atrophy among others. Patient expresses understanding and agreement Time-out conducted.   Noted no overlying erythema, induration, or other signs of local infection.   Skin prepped in a sterile fashion.   Local anesthesia: Topical Ethyl chloride.   With sterile technique and under real time ultrasound guidance:  40 mg of Depo-Medrol and 1 mL of Marcaine injected easily.   Completed without difficulty   Pain immediately resolved suggesting accurate placement of the medication.   Advised to call if fevers/chills, erythema, induration, drainage, or persistent bleeding.   Images permanently stored and available for review in the ultrasound unit.  Impression: Technically successful ultrasound guided injection.  MRI [place] Right WO IV Contrast9/02/2018 [facility] Result Impression  IMPRESSION: 1. Mild, extensor tendinosis (lateral epicondylitis.  2. Early subchondral change of the  volar ulnar aspect of the radial head possibly representing early osteochondral abnormality or early subchondral cyst.  3. Small elbow joint effusion possibly degenerative or posttraumatic in nature.  4. Tendinosis of the distal biceps.  5. Degenerative signal and fraying of the proximal lateral ulnar collateral ligament.  Electronically Signed by: Elon Alas  Result Narrative  INDICATION: Pain in right elbow  COMPARISON: None TECHNIQUE: Multiplanar, multisequence MR imaging of the right elbow was performed without contrast.   FINDINGS:  OSSEOUS STRUCTURES/JOINTS/CARTILAGE: # No acute fractures or dislocations. # Small elbow joint effusion. # Mild subcortical edema of the volar ulnar aspect of the radial head.  TENDONS: # Distal biceps tendon: Mild thickening and increased signal intensity the distal 1.2 cm of the biceps tendon. # Distal triceps tendon: Intact. # Common flexor-pronator origins: Intact. # Common extensor origins: Mild increased signal intensity of the proximal 1.2 cm.  LIGAMENTS: # Lateral collateral ligamentous complex, including annular ligament, radial collateral ligament, and lateral ulnar collateral ligament: Thickening and poor definition of the proximal lateral ulnar collateral ligament with intact radial collateral and  annular ligaments. # Medial collateral ligament (UCL): Intact.  ADDITIONAL SOFT TISSUES: # No fluid collection or soft tissue mass. # Cubital tunnel and ulnar nerve are intact. # Olecranon bursa: Intact.     Impression and Recommendations:    Assessment and Plan: 39 y.o. male with  Right elbow pain.  Pain is multifactorial.  [place] does have a component of lateral epicondylitis seen on physical exam today as well as MRI from September 2020.  Plan to proceed with further conservative management including home exercise program and counterforce brace with tennis.  Also proceed with injection as above is he is  already tried some conservative management.  Will check back in about a month.  Additionally he has a component of distal biceps tendinitis.  Again this is seen on physical exam.  We will augment home exercise program to also emphasize biceps tendinitis and recheck in a month.  Additionally patient describes elbow effusions at times and certainly has evidence of interarticular cause of pain.  On MRI he has subchondral changes at radial head positive represent an early osteochondral injury or subchondral cyst.  And he has degenerative signal fraying at ulnar collateral ligament.  Fortunately I do not appreciate instability at the UCL on exam today however UCL laxity or instability can cause impact injury to the radial head with repetitive overhead motion such as a tennis serve.  Discussed that although he can continue to play tennis if desiring he should certainly modify his tennis serve.    We will recheck in 1 month.   Orders Placed This Encounter  Procedures  . NO CHG - [place] UPPER RIGHT    Order Specific Question:   Reason for Exam (SYMPTOM  OR DIAGNOSIS REQUIRED)    Answer:   R elbow pain    Order Specific Question:   Preferred imaging location?    Answer:   [place]   No orders of the defined types were placed in this encounter.   Discussed warning signs or symptoms. Please see discharge instructions. Patient expresses understanding.  The above documentation has been reviewed and is accurate and complete [name]

## 2019-02-05 DIAGNOSIS — M7521 Bicipital tendinitis, right shoulder: Secondary | ICD-10-CM | POA: Insufficient documentation

## 2019-02-05 DIAGNOSIS — M25421 Effusion, right elbow: Secondary | ICD-10-CM | POA: Insufficient documentation

## 2019-02-08 ENCOUNTER — Encounter: Payer: Self-pay | Admitting: Internal Medicine

## 2019-02-08 ENCOUNTER — Ambulatory Visit (INDEPENDENT_AMBULATORY_CARE_PROVIDER_SITE_OTHER): Payer: BC Managed Care – PPO | Admitting: Internal Medicine

## 2019-02-08 ENCOUNTER — Other Ambulatory Visit: Payer: Self-pay

## 2019-02-08 VITALS — BP 138/86 | HR 83 | Temp 98.4°F | Resp 16 | Ht 70.0 in | Wt 194.0 lb

## 2019-02-08 DIAGNOSIS — N281 Cyst of kidney, acquired: Secondary | ICD-10-CM | POA: Diagnosis not present

## 2019-02-08 DIAGNOSIS — E039 Hypothyroidism, unspecified: Secondary | ICD-10-CM | POA: Diagnosis not present

## 2019-02-08 DIAGNOSIS — I10 Essential (primary) hypertension: Secondary | ICD-10-CM | POA: Diagnosis not present

## 2019-02-08 DIAGNOSIS — Z23 Encounter for immunization: Secondary | ICD-10-CM

## 2019-02-08 DIAGNOSIS — Z0000 Encounter for general adult medical examination without abnormal findings: Secondary | ICD-10-CM | POA: Diagnosis not present

## 2019-02-08 DIAGNOSIS — J301 Allergic rhinitis due to pollen: Secondary | ICD-10-CM

## 2019-02-08 LAB — URINALYSIS, ROUTINE W REFLEX MICROSCOPIC
Bilirubin Urine: NEGATIVE
Hgb urine dipstick: NEGATIVE
Ketones, ur: NEGATIVE
Leukocytes,Ua: NEGATIVE
Nitrite: NEGATIVE
RBC / HPF: NONE SEEN (ref 0–?)
Specific Gravity, Urine: 1.01 (ref 1.000–1.030)
Total Protein, Urine: NEGATIVE
Urine Glucose: NEGATIVE
Urobilinogen, UA: 0.2 (ref 0.0–1.0)
WBC, UA: NONE SEEN (ref 0–?)
pH: 6 (ref 5.0–8.0)

## 2019-02-08 LAB — CBC WITH DIFFERENTIAL/PLATELET
Basophils Absolute: 0 10*3/uL (ref 0.0–0.1)
Basophils Relative: 0.4 % (ref 0.0–3.0)
Eosinophils Absolute: 0.1 10*3/uL (ref 0.0–0.7)
Eosinophils Relative: 1.9 % (ref 0.0–5.0)
HCT: 50.1 % (ref 39.0–52.0)
Hemoglobin: 17 g/dL (ref 13.0–17.0)
Lymphocytes Relative: 50.2 % — ABNORMAL HIGH (ref 12.0–46.0)
Lymphs Abs: 2.8 10*3/uL (ref 0.7–4.0)
MCHC: 33.9 g/dL (ref 30.0–36.0)
MCV: 93.5 fl (ref 78.0–100.0)
Monocytes Absolute: 0.7 10*3/uL (ref 0.1–1.0)
Monocytes Relative: 12.7 % — ABNORMAL HIGH (ref 3.0–12.0)
Neutro Abs: 1.9 10*3/uL (ref 1.4–7.7)
Neutrophils Relative %: 34.8 % — ABNORMAL LOW (ref 43.0–77.0)
Platelets: 225 10*3/uL (ref 150.0–400.0)
RBC: 5.36 Mil/uL (ref 4.22–5.81)
RDW: 12.9 % (ref 11.5–15.5)
WBC: 5.5 10*3/uL (ref 4.0–10.5)

## 2019-02-08 LAB — BASIC METABOLIC PANEL
BUN: 18 mg/dL (ref 6–23)
CO2: 25 mEq/L (ref 19–32)
Calcium: 9.4 mg/dL (ref 8.4–10.5)
Chloride: 104 mEq/L (ref 96–112)
Creatinine, Ser: 1 mg/dL (ref 0.40–1.50)
GFR: 83.31 mL/min (ref >60.00)
Glucose, Bld: 102 mg/dL — ABNORMAL HIGH (ref 70–99)
Potassium: 3.8 mEq/L (ref 3.5–5.1)
Sodium: 139 mEq/L (ref 135–145)

## 2019-02-08 LAB — LIPID PANEL
Cholesterol: 179 mg/dL (ref 0–200)
HDL: 57.6 mg/dL (ref >39.00)
LDL Cholesterol: 100 mg/dL — ABNORMAL HIGH (ref 0–99)
NonHDL: 121.09
Total CHOL/HDL Ratio: 3
Triglycerides: 107 mg/dL (ref 0.0–149.0)
VLDL: 21.4 mg/dL (ref 0.0–40.0)

## 2019-02-08 LAB — TSH: TSH: 6.78 u[IU]/mL — ABNORMAL HIGH (ref 0.35–4.50)

## 2019-02-08 MED ORDER — LEVOCETIRIZINE DIHYDROCHLORIDE 5 MG PO TABS: 10.0000 mg | ORAL_TABLET | Freq: Every evening | ORAL | 1 refills | Status: DC

## 2019-02-08 NOTE — Progress Notes (Signed)
Subjective:  Patient ID: Jacob [name], male    DOB: 1980-12-01  Age: 39 y.o. MRN: [id number]  CC: Annual Exam, Hypertension, and Allergic Rhinitis   This visit occurred during the SARS-CoV-2 public health emergency.  Safety protocols were in place, including screening questions prior to the visit, additional usage of staff PPE, and extensive cleaning of exam room while observing appropriate contact time as indicated for disinfecting solutions.   HPI JEREMIAN VELARDE presents for a CPX.  He complains of chronic nasal congestion.  He tells me he is seeing an ENT doctor and is undergoing allergy treatment.  He has been using Sudafed recently to treat the symptoms but he complains that this is elevated his blood pressure and has caused anxiety.  He tells me he is compliant with the use of cetirizine and Flonase nasal spray.  Outpatient Medications Prior to Visit  Medication Sig Dispense Refill  . cetirizine (ZYRTEC) 10 MG tablet Take 10 mg by mouth daily.      . clomiPHENE (CLOMID) 50 MG tablet Take 50 mg by mouth. 50 mg M,W,F and 25 mg T,Th,S,Su    . esomeprazole (NEXIUM) 20 MG capsule Take 1 capsule (20 mg total) by mouth daily at 12 noon. 90 capsule 1  . NON FORMULARY [facility]  WARTS- #10 WART CREAM    . thyroid (ARMOUR) 32.5 MG tablet Take 1 tablet (32.5 mg total) by mouth daily. (Patient taking differently: Take 32.5 mg by mouth daily. Pt takes 3 tablets to equal 97.5 mg) 90 tablet 1  . thyroid (ARMOUR) 65 MG tablet Take 1 tablet (65 mg total) by mouth daily. 90 tablet 1  . DULoxetine (CYMBALTA) 30 MG capsule TAKE 1 CAPSULE (30 MG TOTAL) BY MOUTH DAILY. MUST KEEP APPT FOR FUTURE REFILLS 90 capsule 1   No facility-administered medications prior to visit.    ROS Review of Systems  Constitutional: Positive for unexpected weight change (wt gain). Negative for diaphoresis and fatigue.  HENT: Positive for congestion. Negative for facial swelling, nosebleeds, postnasal  drip, rhinorrhea, sinus pressure, sinus pain, sneezing, sore throat, tinnitus, trouble swallowing and voice change.   Eyes: Negative for visual disturbance.  Respiratory: Negative for cough, chest tightness, shortness of breath and wheezing.   Cardiovascular: Negative for chest pain, palpitations and leg swelling.  Gastrointestinal: Negative for abdominal pain, nausea and vomiting.  Endocrine: Negative.  Negative for cold intolerance and heat intolerance.  Genitourinary: Negative.  Negative for difficulty urinating, dysuria, scrotal swelling and testicular pain.  Skin: Negative.   Neurological: Negative.  Negative for dizziness, weakness, light-headedness, numbness and headaches.  Hematological: Negative.  Negative for adenopathy. Does not bruise/bleed easily.  Psychiatric/Behavioral: Negative for dysphoric mood, sleep disturbance and suicidal ideas. The patient is nervous/anxious.     Objective:  BP 138/86 (BP Location: Left Arm, Patient Position: Sitting, Cuff Size: Large)   Pulse 83   Temp 98.4 F (36.9 C) (Oral)   Resp 16   Ht 5\' 10"  (1.778 m)   Wt 194 lb (88 kg)   SpO2 97%   BMI 27.84 kg/m   BP Readings from Last 3 Encounters:  02/08/19 138/86  02/04/19 132/88  02/05/18 130/80    Wt Readings from Last 3 Encounters:  02/08/19 194 lb (88 kg)  02/04/19 193 lb 12.8 oz (87.9 kg)  02/05/18 191 lb (86.6 kg)    Physical Exam Vitals reviewed.  Constitutional:      Appearance: Normal appearance.  HENT:     Nose: Mucosal  edema present. No nasal tenderness, congestion or rhinorrhea.     Right Nostril: No epistaxis or occlusion.     Left Nostril: No epistaxis or occlusion.     Right Turbinates: Swollen and pale. Not enlarged.     Left Turbinates: Swollen and pale. Not enlarged.     Right Sinus: No maxillary sinus tenderness or frontal sinus tenderness.     Left Sinus: No maxillary sinus tenderness or frontal sinus tenderness.     Mouth/Throat:     Mouth: Mucous membranes are  moist.  Eyes:     General: No scleral icterus.    Conjunctiva/sclera: Conjunctivae normal.  Cardiovascular:     Rate and Rhythm: Normal rate.     Heart sounds: No murmur.  Pulmonary:     Effort: Pulmonary effort is normal.     Breath sounds: No stridor. No wheezing, rhonchi or rales.  Abdominal:     General: Abdomen is flat. Bowel sounds are normal. There is no distension.     Palpations: Abdomen is soft. There is no hepatomegaly or splenomegaly.     Tenderness: There is no abdominal tenderness.  Musculoskeletal:     Cervical back: Normal range of motion and neck supple.  Lymphadenopathy:     Cervical: No cervical adenopathy.  Neurological:     Mental Status: He is alert.     Lab Results  Component Value Date   WBC 5.5 02/08/2019   HGB 17.0 02/08/2019   HCT 50.1 02/08/2019   PLT 225.0 02/08/2019   GLUCOSE 102 (H) 02/08/2019   CHOL 179 02/08/2019   TRIG 107.0 02/08/2019   HDL 57.60 02/08/2019   LDLCALC 100 (H) 02/08/2019   ALT 18 10/14/2016   AST 25 10/14/2016   NA 139 02/08/2019   K 3.8 02/08/2019   CL 104 02/08/2019   CREATININE 1.00 02/08/2019   BUN 18 02/08/2019   CO2 25 02/08/2019   TSH 6.78 (H) 02/08/2019   HGBA1C 5.3 10/14/2016    MR MRA HEAD WO CONTRAST  Result Date: 11/27/2016 CLINICAL DATA:  Family history of aneurysm. EXAM: MRA HEAD WITHOUT CONTRAST TECHNIQUE: Angiographic images of the Circle of Willis were obtained using MRA technique without intravenous contrast. COMPARISON:  None. FINDINGS: Intracranial internal carotid arteries: Normal. Anterior cerebral arteries: Normal. Middle cerebral arteries: Normal. Posterior communicating arteries: Absent bilaterally. Posterior cerebral arteries: Normal. Basilar artery: Normal. Vertebral arteries: Left dominant. Normal. Superior cerebellar arteries: Normal. Anterior inferior cerebellar arteries: Normal. Posterior inferior cerebellar arteries: Normal. IMPRESSION: Normal intracranial MRA.  No aneurysm.  Electronically Signed   By: Ulyses Jarred M.D.   On: 11/27/2016 22:22    Assessment & Plan:   Vicente was seen today for annual exam, hypertension and allergic rhinitis .  Diagnoses and all orders for this visit:  Acquired hypothyroidism- His TSH is elevated and he complains of weight gain.  I recommended that he discuss this with the doctor that is treating his hypothyroidism. -     TSH  Renal cyst, left- Based on his symptoms and normal urinalysis there are no complications related to this. -     Urinalysis, Routine w reflex microscopic  Routine general medical examination at a health care facility- Exam completed, labs reviewed, vaccines reviewed and updated, patient education was given. -     Lipid panel  Non-seasonal allergic rhinitis due to pollen- He is having complications from the decongestants so I have asked him to stop taking sudafed. I have recommended that he discontinue Zyrtec and to upgrade  to levocetirizine.  I have encouraged him to continue to be compliant with the Flonase nasal spray. -     Discontinue: levocetirizine (XYZAL) 5 MG tablet; Take 2 tablets (10 mg total) by mouth every evening.  Essential hypertension- He is somewhere between prehypertension and stage I hypertension.  This is obviously exacerbated by the recent use of decongestants.  He agrees to stop taking Sudafed.  His labs are negative for secondary causes or endorgan damage.  He was not willing to start an antihypertensive today.  He will continue to work on his lifestyle modifications and we will recheck his blood pressure in about 6 months. -     CBC with Differential/Platelet -     Basic metabolic panel -     TSH -     Urinalysis, Routine w reflex microscopic  Need for HPV vaccination -     HPV 9-valent vaccine,Recombinat   I have discontinued [employer]'s levocetirizine. I am also having him maintain his cetirizine, clomiPHENE, esomeprazole, thyroid, thyroid, and NON  FORMULARY.  Meds ordered this encounter  Medications  . DISCONTD: levocetirizine (XYZAL) 5 MG tablet    Sig: Take 2 tablets (10 mg total) by mouth every evening.    Dispense:  180 tablet    Refill:  1     Follow-up: Return in about 4 weeks (around 03/08/2019).  Scarlette Calico, MD

## 2019-02-08 NOTE — Patient Instructions (Signed)

## 2019-02-09 ENCOUNTER — Other Ambulatory Visit: Payer: Self-pay | Admitting: Internal Medicine

## 2019-02-09 DIAGNOSIS — F418 Other specified anxiety disorders: Secondary | ICD-10-CM

## 2019-02-11 ENCOUNTER — Encounter: Payer: Self-pay | Admitting: Internal Medicine

## 2019-02-16 DIAGNOSIS — E039 Hypothyroidism, unspecified: Secondary | ICD-10-CM | POA: Diagnosis not present

## 2019-03-02 NOTE — Progress Notes (Signed)
Virtual Visit via Video Note   This visit type was conducted due to national recommendations for restrictions regarding the COVID-19 Pandemic (e.g. social distancing) in an effort to limit this patient's exposure and mitigate transmission in our community.  Due to his co-morbid illnesses, this patient is at least at moderate risk for complications without adequate follow up.  This format is felt to be most appropriate for this patient at this time.  All issues noted in this document were discussed and addressed.  A limited physical exam was performed with this format.  Please refer to the patient's chart for his consent to telehealth for [facility].   Date:  03/04/2019   ID:  Jacob [name], DOB 1981-01-15, MRN [id number]  Patient Location: Home Provider Location: Office  PCP:  Janith Lima, MD  Cardiologist: Ena Dawley  Evaluation Performed:  New Patient Evaluation  Referring MD: Janith Lima, MD   Reason for visit: Family history of premature coronary artery disease  History of Present Illness:    Jacob [name] is a 39 y.o. male with no significant past medical history who is engaged in moderate amount of daily physical activity mostly in the gym and including heavy lifting.  The patient is using ephedrine nasal drops frequently for sinus congestion and allergies and on 1 occasion was told that his blood pressure was elevated.  However when measured at home always under 120 mmHg.  He lives very healthy lifestyle with healthy diet, regular exercise.  He is concerned about impact of isometric exercise and ephedrine use on his heart and potentially hypertension.  He is also concerned about possible premature coronary artery disease given his family history.  Both of his parents are patient of ours his mother recently underwent coronary CTA and has only minimal coronary artery disease.  However his father who was Special educational needs teacher for [employer] and continues to be very  active in sports, eats healthy diet, has never smoked, has extensive history of coronary stenting with first intervention at age of 52. The patient denies any palpitation dizziness or syncope.  He has no chest pain shortness of breath.  The patient does not have symptoms concerning for COVID-19 infection (fever, chills, cough, or new shortness of breath).    Past Medical History:  Diagnosis Date  . Anxiety   . 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 Depression   . Genital warts   . GERD (gastroesophageal reflux disease)    w/ HH  . H/O alcohol abuse    sober since 02/04/2007  . Hypothyroid    Past Surgical History:  Procedure Laterality Date  . WRIST SURGERY     left     Current Meds  Medication Sig  . clomiPHENE (CLOMID) 50 MG tablet Take 50 mg by mouth. 50 mg M,W,F and 25 mg T,Th,S,Su  . DULoxetine (CYMBALTA) 30 MG capsule TAKE 1 CAPSULE (30 MG TOTAL) BY MOUTH DAILY. MUST KEEP APPT FOR FUTURE REFILLS  . esomeprazole (NEXIUM) 20 MG capsule Take 1 capsule (20 mg total) by mouth daily at 12 noon.  Marland Kitchen levocetirizine (XYZAL) 5 MG tablet Take 10 mg by mouth at bedtime.  Marland Kitchen UNABLE TO FIND Three grains nature thyroid     Allergies:   Patient has no known allergies.   Social History   Tobacco Use  . Smoking status: Former Smoker    Types: Cigarettes  Quit date: 03/28/2008    Years since quitting: 10.9  . Smokeless tobacco: Never Used  . Tobacco comment: using lozenges  Substance Use Topics  . Alcohol use: No  . Drug use: No     Family Hx: The patient's family history includes Diabetes (age of onset: 38) in his sister; Heart disease (age of onset: 88) in his father; Hyperlipidemia in his father; Hypertension in his father.  ROS:   Please see the history of present illness.    All other systems reviewed and are negative.  Prior CV studies:   The following studies were reviewed today:   Labs/Other  Tests and Data Reviewed:    EKG:  No ECG reviewed.  Recent Labs: 02/08/2019: BUN 18; Creatinine, Ser 1.00; Hemoglobin 17.0; Platelets 225.0; Potassium 3.8; Sodium 139; TSH 6.78   Recent Lipid Panel Lab Results  Component Value Date/Time   CHOL 179 02/08/2019 11:06 AM   TRIG 107.0 02/08/2019 11:06 AM   HDL 57.60 02/08/2019 11:06 AM   CHOLHDL 3 02/08/2019 11:06 AM   LDLCALC 100 (H) 02/08/2019 11:06 AM    Wt Readings from Last 3 Encounters:  03/03/19 188 lb (85.3 kg)  02/08/19 194 lb (88 kg)  02/04/19 193 lb 12.8 oz (87.9 kg)     Objective:    Vital Signs:  Ht 5\' 10"  (1.778 m)   Wt 188 lb (85.3 kg)   BMI 26.98 kg/m    VITAL SIGNS:  reviewed  ASSESSMENT & PLAN:    1. Family history of premature coronary artery disease, patient's father history pretty significant for early CAD despite optimal lifestyle with healthy diet and strenuous exercise.  The patient himself has normal lipid profile is very active and eat healthy.  We will obtain calcium scoring and if normal we will not need to repeat for probably next 10 years.  If abnormal we will initiate lipid-lowering therapy. 2. Borderline hypertension, most probably secondary to ephedrine use, he is advised not to use it more than once or twice a week, we will obtain an echocardiogram to evaluate for his LV systolic diastolic function and degree of LVH.  If normal, he can continue his regular exercise routine.  COVID-19 Education: The signs and symptoms of COVID-19 were discussed with the patient and how to seek care for testing (follow up with PCP or arrange E-visit).  The importance of social distancing was discussed today.  Time:   Today, I have spent 30 minutes with the patient with telehealth technology discussing the above problems.     Medication Adjustments/Labs and Tests Ordered: Current medicines are reviewed at length with the patient today.  Concerns regarding medicines are outlined above.   Tests Ordered: Orders  Placed This Encounter  Procedures  . CT CARDIAC SCORING  . ECHOCARDIOGRAM COMPLETE    Medication Changes: No orders of the defined types were placed in this encounter.   Follow Up:  Either In Person or Virtual prn  Signed, Ena Dawley, MD  03/04/2019 12:22 AM    [place]

## 2019-03-03 ENCOUNTER — Encounter: Payer: Self-pay | Admitting: *Deleted

## 2019-03-03 ENCOUNTER — Telehealth (INDEPENDENT_AMBULATORY_CARE_PROVIDER_SITE_OTHER): Payer: BC Managed Care – PPO | Admitting: Cardiology

## 2019-03-03 ENCOUNTER — Encounter: Payer: Self-pay | Admitting: Cardiology

## 2019-03-03 ENCOUNTER — Other Ambulatory Visit: Payer: Self-pay

## 2019-03-03 VITALS — Ht 70.0 in | Wt 188.0 lb

## 2019-03-03 DIAGNOSIS — Z8249 Family history of ischemic heart disease and other diseases of the circulatory system: Secondary | ICD-10-CM | POA: Diagnosis not present

## 2019-03-03 NOTE — Patient Instructions (Signed)
Medication Instructions:   Your physician recommends that you continue on your current medications as directed. Please refer to the Current Medication list given to you today.  *If you need a refill on your cardiac medications before your next appointment, please call your pharmacy*   Testing/Procedures:  Your physician has requested that you have an echocardiogram. Echocardiography is a painless test that uses sound waves to create images of your heart. It provides your doctor with information about the size and shape of your heart and how well your heart's chambers and valves are working. This procedure takes approximately one hour. There are no restrictions for this procedure.  OUR SCHEDULING TEAM WILL CALL YOU IN THE VERY NEAR FUTURE AND SCHEDULED YOUR ECHO TO BE DONE HERE AT THE OFFICE.    CALCIUM SCORE TO BE DONE HERE IN THE OFFICE--OUR SCHEDULING DEPARTMENT WILL CALL YOU SOON TO ARRANGE FOR YOU TO COME TO HAVE THIS DONE HERE IN THE OFFICE, SAME DAY AS YOUR ECHO.   Follow-Up:  AS NEEDED WITH DR. Jennefer Bravo WILL BE BASED ON YOUR TEST RESULTS.

## 2019-03-04 ENCOUNTER — Ambulatory Visit (INDEPENDENT_AMBULATORY_CARE_PROVIDER_SITE_OTHER): Payer: BC Managed Care – PPO | Admitting: *Deleted

## 2019-03-04 ENCOUNTER — Other Ambulatory Visit: Payer: Self-pay

## 2019-03-04 DIAGNOSIS — Z23 Encounter for immunization: Secondary | ICD-10-CM

## 2019-03-11 ENCOUNTER — Ambulatory Visit: Payer: BC Managed Care – PPO

## 2019-03-18 ENCOUNTER — Ambulatory Visit (HOSPITAL_COMMUNITY): Payer: BC Managed Care – PPO | Attending: Cardiovascular Disease

## 2019-03-18 ENCOUNTER — Other Ambulatory Visit: Payer: Self-pay

## 2019-03-18 ENCOUNTER — Ambulatory Visit (INDEPENDENT_AMBULATORY_CARE_PROVIDER_SITE_OTHER)
Admission: RE | Admit: 2019-03-18 | Discharge: 2019-03-18 | Disposition: A | Discharge status: Home / Self Care | Payer: Self-pay | Source: Ambulatory Visit | Attending: Cardiology | Admitting: Cardiology

## 2019-03-18 DIAGNOSIS — Z8249 Family history of ischemic heart disease and other diseases of the circulatory system: Secondary | ICD-10-CM | POA: Insufficient documentation

## 2019-03-19 ENCOUNTER — Ambulatory Visit: Payer: BC Managed Care – PPO | Admitting: Family Medicine

## 2019-03-23 ENCOUNTER — Telehealth: Payer: Self-pay | Admitting: Cardiology

## 2019-03-23 NOTE — Telephone Encounter (Signed)
New message   Patient is returning call for test results. Please call.

## 2019-03-23 NOTE — Telephone Encounter (Signed)
Pt advised and sent to My Chart.

## 2019-03-24 ENCOUNTER — Encounter: Payer: Self-pay | Admitting: Physician Assistant

## 2019-03-31 ENCOUNTER — Telehealth: Payer: Self-pay | Admitting: Physician Assistant

## 2019-03-31 ENCOUNTER — Encounter: Payer: Self-pay | Admitting: Physician Assistant

## 2019-03-31 ENCOUNTER — Other Ambulatory Visit (INDEPENDENT_AMBULATORY_CARE_PROVIDER_SITE_OTHER): Payer: BC Managed Care – PPO

## 2019-03-31 ENCOUNTER — Ambulatory Visit (INDEPENDENT_AMBULATORY_CARE_PROVIDER_SITE_OTHER): Payer: BC Managed Care – PPO | Admitting: Physician Assistant

## 2019-03-31 VITALS — BP 112/78 | HR 76 | Temp 97.8°F | Ht 69.75 in | Wt 191.2 lb

## 2019-03-31 DIAGNOSIS — K219 Gastro-esophageal reflux disease without esophagitis: Secondary | ICD-10-CM

## 2019-03-31 LAB — H. PYLORI ANTIBODY, IGG: H Pylori IgG: NEGATIVE

## 2019-03-31 MED ORDER — PANTOPRAZOLE SODIUM 40 MG PO TBEC: 40.0000 mg | DELAYED_RELEASE_TABLET | Freq: Every day | ORAL | 3 refills | Status: DC

## 2019-03-31 NOTE — Patient Instructions (Addendum)
If you are age 39 or older, your body mass index should be between 23-30. Your Body mass index is 27.64 kg/m. If this is out of the aforementioned range listed, please consider follow up with your Primary Care Provider.  If you are age 28 or younger, your body mass index should be between 19-25. Your Body mass index is 27.64 kg/m. If this is out of the aformentioned range listed, please consider follow up with your Primary Care Provider.   Your provider has requested that you go to the basement level for lab work before leaving today. Press "B" on the elevator. The lab is located at the first door on the left as you exit the elevator.  We have sent the following medications to your pharmacy for you to pick up at your convenience: Pantoprazole 40 mg daily take 30-60 minutes before breakfast.   Please purchase the following medications over the counter and take as directed: FDgard 1 capsule twice daily 30-60 minutes before breakfast and dinner.  Call with update in 1-2 months, if doing better may decrease to 20 mg.

## 2019-03-31 NOTE — Progress Notes (Signed)
Chief Complaint: GERD  HPI:    Jacob [name] is a 39 year old male with a past medical history as listed below, previously known to Dr. Sharlett Iles, including anxiety, who was referred to me by Janith Lima, MD for a complaint of GERD.     04/06/2012 EGD with class a esophagitis and a 2 cm hiatal hernia and otherwise normal.  Biopsies showed reflux.  Treated with Nexium 40 mg daily.    Today, patient presents to clinic and explains that he was diagnosed with erosive esophagitis years ago and did better with Nexium, in fact he even got come off of this for a year or 2 without any symptoms, but then had to started back at 20 mg daily which he had been doing for a year or so until about 2 weeks ago when he had "insane reflux" after getting back from vacation though he tells me he did not alter his diet or indulge in anything including alcohol while he was there.  He increased it to 40 mg and sometimes even was taking 20 mg at night in addition.  Over the past 6 days he started taking Pantoprazole 40 mg daily from his father and this has been helping his symptoms.  He wonders what would have caused the increase in symptoms as nothing really changed in his life.  He did stop using his nicotine lozenges because he thought possibly this was causing his symptoms to be worse.    Tells me his only vices in life are diet Coke and diet green tea drinks as well as coffee in the morning.  He is married to a PA who works in cardiology and his father is a Tax adviser.    Denies fever, chills, weight loss, change in bowel habits or abdominal pain.  Past Medical History:  Diagnosis Date  . Anxiety   . 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 Depression   . Genital warts   . GERD (gastroesophageal reflux disease)    w/ HH  . H/O alcohol abuse    sober since 02/04/2007  . Hypothyroid     Past Surgical History:  Procedure Laterality Date  . WRIST  SURGERY     left    Current Outpatient Medications  Medication Sig Dispense Refill  . clomiPHENE (CLOMID) 50 MG tablet Take 50 mg by mouth. 50 mg M,W,F and 25 mg T,Th,S,Su    . DULoxetine (CYMBALTA) 30 MG capsule TAKE 1 CAPSULE (30 MG TOTAL) BY MOUTH DAILY. MUST KEEP APPT FOR FUTURE REFILLS 90 capsule 1  . esomeprazole (NEXIUM) 20 MG capsule Take 1 capsule (20 mg total) by mouth daily at 12 noon. 90 capsule 1  . levocetirizine (XYZAL) 5 MG tablet Take 10 mg by mouth at bedtime.    Marland Kitchen UNABLE TO FIND Three grains nature thyroid     No current facility-administered medications for this visit.    Allergies as of 03/31/2019  . (No Known Allergies)    Family History  Problem Relation Age of Onset  . Heart disease Father 40  . Hyperlipidemia Father   . Hypertension Father   . Diabetes Sister 86    Social History   Socioeconomic History  . Marital status: Married    Spouse name: Not on file  . Number of children: 0  . Years of education: Not on file  . Highest education level: Not on file  Occupational  History    Employer: [employer]  Tobacco Use  . Smoking status: Former Smoker    Types: Cigarettes    Quit date: 03/28/2008    Years since quitting: 11.0  . Smokeless tobacco: Never Used  . Tobacco comment: using lozenges  Substance and Sexual Activity  . Alcohol use: No  . Drug use: No  . Sexual activity: Not on file  Other Topics Concern  . 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 Difficulty of Paying Living Expenses: Not on file  Food Insecurity:   . Worried About Charity fundraiser in the Last Year: Not on file  . Ran Out of Food in the Last Year: Not on file  Transportation Needs:   . Lack of  Transportation (Medical): Not on file  . Lack of Transportation (Non-Medical): Not on file  Physical Activity:   . Days of Exercise per Week: Not on file  . Minutes of Exercise per Session: Not on file  Stress:   . Feeling of Stress : Not on file  Social Connections:   . Frequency of Communication with Friends and Family: Not on file  . Frequency of Social Gatherings with Friends and Family: Not on file  . Attends Religious Services: Not on file  . Active Member of Clubs or Organizations: Not on file  . Attends Archivist Meetings: Not on file  . Marital Status: Not on file  Intimate Partner Violence:   . Fear of Current or Ex-Partner: Not on file  . Emotionally Abused: Not on file  . Physically Abused: Not on file  . Sexually Abused: Not on file    Review of Systems:    Constitutional: No weight loss, fever or chills Skin: No rash  Cardiovascular: No chest pain Respiratory: No SOB Gastrointestinal: See HPI and otherwise negative Genitourinary: No dysuria  Neurological: No headache Musculoskeletal: No new muscle or joint pain Hematologic: No bleeding  Psychiatric: +anxiety   Physical Exam:  Vital signs: BP 112/78 (BP Location: Left Arm, Patient Position: Sitting, Cuff Size: Normal)   Pulse 76   Temp 97.8 F (36.6 C)   Ht 5' 9.75" (1.772 m) Comment: height measured without shoes  Wt 191 lb 4 oz (86.8 kg)   BMI 27.64 kg/m   Constitutional:   Pleasant Caucasian male appears to be in NAD, Well developed, Well nourished, alert and cooperative Head:  Normocephalic and atraumatic. Eyes:   PEERL, EOMI. No icterus. Conjunctiva pink. Ears:  Normal auditory acuity. Neck:  Supple Throat: Oral cavity and pharynx without inflammation, swelling or lesion.  Respiratory: Respirations even and unlabored. Lungs clear to auscultation bilaterally.   No wheezes, crackles, or rhonchi.  Cardiovascular: Normal S1, S2. No MRG. Regular rate and rhythm. No peripheral edema, cyanosis  or pallor.  Gastrointestinal:  Soft, nondistended, nontender. No rebound or guarding. Normal bowel sounds. No appreciable masses or hepatomegaly. Rectal:  Not performed.  Msk:  Symmetrical without gross deformities. Without edema, no deformity or joint abnormality.  Neurologic:  Alert and  oriented x4;  grossly normal neurologically.  Skin:   Dry and intact without significant lesions or rashes. Psychiatric: Demonstrates good judgement and reason without abnormal affect or behaviors.  MOST RECENT LABS  AND IMAGING: CBC    Component Value Date/Time   WBC 5.5 02/08/2019 1106   RBC 5.36 02/08/2019 1106   HGB 17.0 02/08/2019 1106   HCT 50.1 02/08/2019 1106   PLT 225.0 02/08/2019 1106   MCV 93.5 02/08/2019 1106   MCHC 33.9 02/08/2019 1106   RDW 12.9 02/08/2019 1106   LYMPHSABS 2.8 02/08/2019 1106   MONOABS 0.7 02/08/2019 1106   EOSABS 0.1 02/08/2019 1106   BASOSABS 0.0 02/08/2019 1106    CMP     Component Value Date/Time   NA 139 02/08/2019 1106   K 3.8 02/08/2019 1106   CL 104 02/08/2019 1106   CO2 25 02/08/2019 1106   GLUCOSE 102 (H) 02/08/2019 1106   BUN 18 02/08/2019 1106   BUN 17 10/14/2016 0000   CREATININE 1.00 02/08/2019 1106   CALCIUM 9.4 02/08/2019 1106   PROT 6.9 12/04/2015 0827   ALBUMIN 4.4 12/04/2015 0827   AST 25 10/14/2016 0000   ALT 18 10/14/2016 0000   ALKPHOS 55 10/14/2016 0000   BILITOT 0.6 12/04/2015 0827   GFRNONAA 125 03/10/2006 1425   GFRAA 151 03/10/2006 1425    Assessment: 1.  GERD: History of erosive esophagitis on EGD in 2014, increase in symptoms over the past 2 weeks regardless of his usual Nexium, some better with Pantoprazole 40 over the past 6 days; consider relation to diet to versus H. pylori versus medication tachyphylaxis  Plan: 1.  Prescribed Pantoprazole 40 mg daily, 30-60 minutes before breakfast #30 with 3 refills.  Discussed with the patient that he should do this for the next 2 months, at that point if not feeling better then we  should probably do an EGD for further evaluation.  If he is feeling better then we can consider decreasing to 20 mg daily. 2.  Continue antireflux diet and lifestyle modifications. 3.  Also discussed using FD guard 1 tab twice daily.  Provided him with samples. 4.  Patient does not want to come off of his PPI.  We will do an H. pylori antibody test, if this is positive we will treat him. 5.  Patient to follow in clinic as needed with me. He was assigned to Dr. Henrene Pastor today.  Ellouise Newer, PA-C [facility] 03/31/2019, 2:08 PM  Cc: Janith Lima, MD

## 2019-03-31 NOTE — Telephone Encounter (Signed)
Script sent to pharmacy.  Patient notified.

## 2019-03-31 NOTE — Telephone Encounter (Signed)
Pt stated that he was expecting a prescription for pantoprazole to be sent to pharmacy.

## 2019-03-31 NOTE — Addendum Note (Signed)
Addended by: Horris Latino on: 03/31/2019 04:54 PM   Modules accepted: Orders

## 2019-04-01 NOTE — Progress Notes (Signed)
Assessment and plan reviewed 

## 2019-04-07 ENCOUNTER — Ambulatory Visit: Payer: BC Managed Care – PPO | Attending: Internal Medicine

## 2019-04-07 DIAGNOSIS — Z20822 Contact with and (suspected) exposure to covid-19: Secondary | ICD-10-CM

## 2019-04-08 LAB — NOVEL CORONAVIRUS, NAA: SARS-CoV-2, NAA: NOT DETECTED

## 2019-04-20 DIAGNOSIS — E291 Testicular hypofunction: Secondary | ICD-10-CM | POA: Diagnosis not present

## 2019-04-20 DIAGNOSIS — R5383 Other fatigue: Secondary | ICD-10-CM | POA: Diagnosis not present

## 2019-04-20 DIAGNOSIS — E60 Dietary zinc deficiency: Secondary | ICD-10-CM | POA: Diagnosis not present

## 2019-04-20 DIAGNOSIS — E559 Vitamin D deficiency, unspecified: Secondary | ICD-10-CM | POA: Diagnosis not present

## 2019-04-24 ENCOUNTER — Encounter: Payer: Self-pay | Admitting: Internal Medicine

## 2019-04-24 DIAGNOSIS — F418 Other specified anxiety disorders: Secondary | ICD-10-CM

## 2019-04-26 MED ORDER — LEVOCETIRIZINE DIHYDROCHLORIDE 5 MG PO TABS: 10.0000 mg | ORAL_TABLET | Freq: Every day | ORAL | 1 refills | Status: DC

## 2019-04-26 MED ORDER — DULOXETINE HCL 30 MG PO CPEP: 30.0000 mg | ORAL_CAPSULE | Freq: Every day | ORAL | 1 refills | Status: DC

## 2019-05-04 DIAGNOSIS — R5383 Other fatigue: Secondary | ICD-10-CM | POA: Diagnosis not present

## 2019-05-04 DIAGNOSIS — E039 Hypothyroidism, unspecified: Secondary | ICD-10-CM | POA: Diagnosis not present

## 2019-05-06 DIAGNOSIS — R5383 Other fatigue: Secondary | ICD-10-CM | POA: Diagnosis not present

## 2019-05-06 DIAGNOSIS — E039 Hypothyroidism, unspecified: Secondary | ICD-10-CM | POA: Diagnosis not present

## 2019-05-06 DIAGNOSIS — J309 Allergic rhinitis, unspecified: Secondary | ICD-10-CM | POA: Diagnosis not present

## 2019-05-06 DIAGNOSIS — E291 Testicular hypofunction: Secondary | ICD-10-CM | POA: Diagnosis not present

## 2019-06-02 ENCOUNTER — Other Ambulatory Visit: Payer: Self-pay

## 2019-06-02 MED ORDER — PANTOPRAZOLE SODIUM 40 MG PO TBEC: 40.0000 mg | DELAYED_RELEASE_TABLET | Freq: Two times a day (BID) | ORAL | 0 refills | Status: DC

## 2019-06-22 ENCOUNTER — Other Ambulatory Visit: Payer: Self-pay | Admitting: Physician Assistant

## 2019-07-15 ENCOUNTER — Ambulatory Visit (INDEPENDENT_AMBULATORY_CARE_PROVIDER_SITE_OTHER): Payer: BC Managed Care – PPO | Admitting: Family Medicine

## 2019-07-15 ENCOUNTER — Other Ambulatory Visit: Payer: Self-pay

## 2019-07-15 ENCOUNTER — Encounter: Payer: Self-pay | Admitting: Family Medicine

## 2019-07-15 ENCOUNTER — Ambulatory Visit: Payer: Self-pay

## 2019-07-15 VITALS — BP 128/72 | HR 60 | Ht 69.75 in | Wt 193.2 lb

## 2019-07-15 DIAGNOSIS — M25532 Pain in left wrist: Secondary | ICD-10-CM | POA: Diagnosis not present

## 2019-07-15 DIAGNOSIS — M25521 Pain in right elbow: Secondary | ICD-10-CM

## 2019-07-15 DIAGNOSIS — M7711 Lateral epicondylitis, right elbow: Secondary | ICD-10-CM | POA: Diagnosis not present

## 2019-07-15 DIAGNOSIS — M7521 Bicipital tendinitis, right shoulder: Secondary | ICD-10-CM

## 2019-07-15 NOTE — Patient Instructions (Signed)
Thank you for coming in today. Plan for voltaren gel topically up to 4x daily.  Plan for hand PT.   Do the eccentric exercises biceps and wrist.    Extensor Carpi Ulnaris Tendinitis Rehab Ask your health care provider which exercises are safe for you. Do exercises exactly as told by your health care provider and adjust them as directed. It is normal to feel mild stretching, pulling, tightness, or discomfort as you do these exercises. Stop right away if you feel sudden pain or your pain gets worse. Do not begin these exercises until told by your health care provider. Stretching and range-of-motion exercises These exercises warm up your muscles and joints and improve the movement and flexibility of your forearm. The exercises also help to relieve pain, numbness, and tingling. Wrist extension 1. Stand or sit with your left / right elbow bent to a 90-degree angle (right angle) at your side, with your palm facing the floor. 2. Slowly lift your wrist up so that your fingers move toward the ceiling (extension), stopping when you feel a gentle stretch on the palm side of your forearm. 3. Hold this position for __________ seconds. 4. Return to the starting position. Repeat __________ times. Complete this exercise __________ times a day. Wrist flexion 1. Stand or sit with your left / right elbow bent to a 90-degree angle (right angle) at your side, with your palm facing the floor. 2. Slowly bend your wrist so that your fingers move toward the floor, stopping when you feel a gentle stretch over the back of your forearm. 3. Hold this position for __________ seconds. 4. Return to the starting position. Repeat __________ times. Complete this exercise __________ times a day. Forearm rotation, supination 1. Stand or sit with your left / right elbow bent to a 90-degree angle (right angle) at your side. Position your forearm so that the thumb is facing the ceiling (neutral position). 2. Turn (rotate) your  palm up toward the ceiling (supination), stopping when you feel a gentle stretch. 3. Hold this position for __________ seconds. 4. Return to the starting position. Repeat __________ times. Complete this exercise __________ times a day. Forearm rotation, pronation 1. Stand or sit with your left / right elbow bent to a 90-degree angle (right angle) at your side. Position your forearm so that the thumb is facing the ceiling (neutral position). 2. Turn (rotate) your palm down toward the floor (pronation), stopping when you feel a gentle stretch. 3. Hold this position for __________ seconds. 4. Return to the starting position. Repeat __________ times. Complete this exercise __________ times a day. Wrist extension, assisted 1. Extend your left / right arm in front of you, and point your fingers downward. 2. With your uninjured hand, gently press the back of your left / right hand toward you until you feel a gentle stretch on the top of your forearm and wrist (extension). 3. Hold this position for __________ seconds. 4. Slowly return to the starting position. Repeat __________ times with your elbow straight and __________ times with your elbow bent. Complete this exercises __________ times a day. Wrist flexion, assisted  1. Stand over a tabletop with your left / right hand resting palm-up on the tabletop and with your fingers pointing away from your body. Your arm should be extended, and there should be a slight bend in your elbow. 2. Gently press the back of your hand down onto the table by straightening your elbow (flexion stretch). You should feel a stretch in the  top of your forearm. 3. Hold this position for __________ seconds. 4. Slowly return to the starting position. Repeat __________ times. Complete this exercise __________ times a day. Strengthening exercises These exercises build strength and endurance in your forearm. Endurance is the ability to use your muscles for a long time, even  after they get tired. Wrist extension 1. Sit with your left / right forearm supported on a table and your hand resting palm-down over the edge of the table. 2. Hold a __________ weight in your left / right hand, or hold a rubber exercise band or tube in both hands. If you are holding a band or tube, take up any slack with your other hand so there is a slight tension in the exercise band or tube when you start. 3. Slowly lift your wrist up toward the ceiling (extension). 4. Hold this position for __________ seconds. 5. Slowly lower your hand to the starting position. Repeat __________ times. Complete this exercise __________ times a day. Ulnar deviation 1. Sit with your left / right forearm supported on a table. Your thumb should be pointing upward, and your hand should be able to move down over the table edge. 2. Hold your left / right arm in front of you and hold a rubber exercise band or tube between your hands. There should be a slight tension in the exercise band or tube when you start. 3. Move your wrist so that your pinkie travels toward the floor. Try to only move your wrist and keep the rest of your arm still. 4. Hold this position for __________ seconds. 5. Slowly return your wrist to the starting position. Repeat __________ times. Complete this exercise __________ times a day. Ulnar deviation, eccentric 1. Sit with your left / right forearm supported on a table. Your thumb should be pointing upward, and your hand should be able to move down over the table edge. 2. Hold your uninjured arm in front of you and over your left / right hand. Hold a rubber exercise band or tube between your hands. Do not put any tension on the exercise band or tube yet. 3. Move your left / right wrist so that your pinkie travels toward the floor. 4. Add more tension to the band or tube by pulling it upward with your uninjured hand. 5. Hold this position for __________ seconds. 6. Slowly return to the starting  position, controlling the speed with your left / right hand and wrist (eccentric). Your hand will move toward the ceiling, thumb first. Try to move only your hand and wrist and keep the rest of your arm still. Repeat __________ times. Complete this exercise __________ times a day. Wrist extension, eccentric 1. Sit with your left / right forearm supported on a table. Your palm should be facing the floor, and your hand should be able to move down over the table edge. 2. Hold a __________ weight in the palm of your left / right hand. 3. Using your uninjured hand, lift your left / right wrist up toward the ceiling. 4. Release your left / right hand and begin to very slowly lower it toward the floor (eccentric). 5. When you have gone as far as you can, use your uninjured hand to lift the left / right wrist up toward the ceiling again. Repeat __________ times. Complete this exercise __________ times a day. This information is not intended to replace advice given to you by your health care provider. Make sure you discuss any questions you  have with your health care provider. Document Revised: 05/05/2018 Document Reviewed: 01/28/2018 Elsevier Patient Education  [place].

## 2019-07-15 NOTE — Progress Notes (Signed)
I, [name], LAT, ATC, am serving as scribe for [name].  Jacob [name] is a 39 y.o. male who presents to [place] at [facility] today for f/u of chronic R elbow and L wrist pain.  He was last seen by Dr. Georgina Snell on 02/04/19 and had a R lateral epicondyle injection and was shown a HEP focusing on wrist extensor eccentrics.  He plays tennis regularly and has been seen by multiple providers regarding his chronic R lateral elbow pain.  Since his last visit, pt reports that his R elbow is getting better but notes that he will get some pain along his R biceps.    He is also having some ulnar-sided L wrist pain since Sunday.  He states that he has been playing a lot of tennis.  He states that he had prior L wrist arthroscopic surgery by Dr. Fredna Dow when he was in HS.  He denies any swelling or mechanical symptoms.  His L wrist pain is aggravated w/ wrist pronation/supination and w/ UD.  Diagnostic imaging: R elbow XR- 09/15/18  Pertinent review of systems: No fevers or chills  Relevant historical information: Avid tennis player.  History of renal cyst.   Exam:  BP 128/72 (BP Location: Left Arm, Patient Position: Sitting, Cuff Size: Normal)   Pulse 60   Ht 5' 9.75" (1.772 m)   Wt 193 lb 3.2 oz (87.6 kg)   SpO2 98%   BMI 27.92 kg/m  General: Well Developed, well nourished, and in no acute distress.   MSK:  Right elbow normal-appearing Noted tender palpation lateral epicondyle.  Mildly tender palpation anterior biceps at muscle tendinous junction distally. Normal elbow motion and strength.  No pain with resisted elbow flexion or supination.  Left wrist mature scar anterior wrist otherwise normal-appearing with no swelling. Mildly tender palpation overlying dorsal ulnar wrist and at ulnar side of wrist TFCC. No mechanical popping or clicking. Normal strength.  Some pain with resisted wrist extension and ulnar deviation.    Lab and Radiology  Results  Diagnostic Limited MSK Ultrasound of: Left wrist Small amount of hypoechoic fluid surrounding tendon and tendon sheath at extensor carpi ulnaris tendon. TFCC visible.  Small hypoechoic split down the middle of the TFCC indicating either possible tear or postsurgical change. Flexor carpi ulnaris tendon normal. 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  Diagnostic Limited MSK Ultrasound of: Right elbow Biceps tendon normal-appearing Lateral epicondyle with small avulsion fleck at epicondyle indicating lateral epicondylitis otherwise normal-appearing with no tear. Impression: Lateral epicondylitis    Assessment and Plan: 39 y.o. male with  Left wrist pain: Somewhat unclear etiology.  Patient has history of arthroscopic surgery over 20 years ago.  It is unclear what surgery exactly happened.  Main diagnostic findings today support extensor carpi ulnaris tendinitis and possible TFCC injury.  Plan for Voltaren gel and home exercise program.  If not improving could consider injection or even ultimately MRI arthrogram to further evaluate cause of pain.  Right elbow pain: Pain mostly anterior more reflective of biceps or brachioradialis tendinitis.  Plan for again home exercise program.  Recheck in about 6 weeks especially if not better.   PDMP not reviewed this encounter. Orders Placed This Encounter  Procedures  . [place] LIMITED JOINT SPACE STRUCTURES UP LEFT(NO LINKED CHARGES)    Order Specific Question:   Reason for Exam (SYMPTOM  OR DIAGNOSIS REQUIRED)    Answer:   L wrist pain    Order  Specific Question:   Preferred imaging location?    Answer:   [facility]  . Ambulatory referral to Physical Therapy    Referral Priority:   Routine    Referral Type:   Physical Medicine    Referral Reason:   Specialty Services Required    Requested Specialty:   Physical Therapy    Number of Visits Requested:   1   No orders of the defined  types were placed in this encounter.    Discussed warning signs or symptoms. Please see discharge instructions. Patient expresses understanding.   The above documentation has been reviewed and is accurate and complete [name], M.D.

## 2019-07-20 DIAGNOSIS — M25521 Pain in right elbow: Secondary | ICD-10-CM | POA: Diagnosis not present

## 2019-07-20 DIAGNOSIS — M25532 Pain in left wrist: Secondary | ICD-10-CM | POA: Diagnosis not present

## 2019-07-20 DIAGNOSIS — M7099 Unspecified soft tissue disorder related to use, overuse and pressure multiple sites: Secondary | ICD-10-CM | POA: Diagnosis not present

## 2019-07-20 DIAGNOSIS — M6281 Muscle weakness (generalized): Secondary | ICD-10-CM | POA: Diagnosis not present

## 2019-07-22 ENCOUNTER — Ambulatory Visit: Payer: BC Managed Care – PPO | Admitting: Family Medicine

## 2019-07-24 ENCOUNTER — Other Ambulatory Visit: Payer: Self-pay | Admitting: Physician Assistant

## 2019-07-28 ENCOUNTER — Encounter: Payer: Self-pay | Admitting: Family Medicine

## 2019-07-28 DIAGNOSIS — M7099 Unspecified soft tissue disorder related to use, overuse and pressure multiple sites: Secondary | ICD-10-CM | POA: Diagnosis not present

## 2019-07-28 DIAGNOSIS — M6281 Muscle weakness (generalized): Secondary | ICD-10-CM | POA: Diagnosis not present

## 2019-07-28 DIAGNOSIS — M25532 Pain in left wrist: Secondary | ICD-10-CM | POA: Diagnosis not present

## 2019-07-28 DIAGNOSIS — M25521 Pain in right elbow: Secondary | ICD-10-CM | POA: Diagnosis not present

## 2019-07-30 ENCOUNTER — Ambulatory Visit (INDEPENDENT_AMBULATORY_CARE_PROVIDER_SITE_OTHER): Payer: BC Managed Care – PPO | Admitting: Family Medicine

## 2019-07-30 ENCOUNTER — Other Ambulatory Visit: Payer: Self-pay

## 2019-07-30 ENCOUNTER — Ambulatory Visit: Payer: Self-pay

## 2019-07-30 VITALS — BP 112/82 | HR 65 | Ht 69.0 in | Wt 193.0 lb

## 2019-07-30 DIAGNOSIS — M25532 Pain in left wrist: Secondary | ICD-10-CM

## 2019-07-30 DIAGNOSIS — J301 Allergic rhinitis due to pollen: Secondary | ICD-10-CM | POA: Diagnosis not present

## 2019-07-30 NOTE — Progress Notes (Signed)
Jacob Jacob [name], am serving as a scribe for [name]. This visit occurred during the SARS-CoV-2 public health emergency.  Safety protocols were in place, including screening questions prior to the visit, additional usage of staff PPE, and extensive cleaning of exam room while observing appropriate contact time as indicated for disinfecting solutions.   07/15/2019 Left wrist pain: Somewhat unclear etiology.  Patient has history of arthroscopic surgery over 20 years ago.  It is unclear what surgery exactly happened.  Main diagnostic findings today support extensor carpi ulnaris tendinitis and possible TFCC injury.  Plan for Voltaren gel and home exercise program.  If not improving could consider injection or even ultimately MRI arthrogram to further evaluate cause of pain.  Right elbow pain: Pain mostly anterior more reflective of biceps or brachioradialis tendinitis.  Plan for again home exercise program.  Recheck in about 6 weeks especially if not better.   PDMP not reviewed this encounter.  Update 07/30/2019 Jacob Jacob [name] is a 39 y.o. male who presents to [place] at [facility] today for left wrist pain. Patient states that he has been doing physical therapy which is helping. Icing after playing tennis. Overall feels improvement but would like to try an injection today to alleviate remaining pain.   Pertinent review of systems: Jacob [name] fevers or chills  Relevant historical information: History wrist surgery 20 years ago   Exam:  BP 112/82   Pulse 65   Ht 5\' 9"  (1.753 m)   Wt 193 lb (87.5 kg)   SpO2 99%   BMI 28.50 kg/m  General: Well Developed, well nourished, and in Jacob [name] acute distress.   MSK: Left wrist normal-appearing mildly tender palpation overlying TFCC region and dorsal ulnar wrist. Normal wrist motion. Intact strength.    Lab and Radiology Results  Procedure: Real-time Ultrasound Guided Injection of left wrist intra-articular dorsal  approach. Device: Philips Affiniti 50G Images permanently stored and available for review in the ultrasound unit. Verbal informed consent obtained.  Discussed risks and benefits of procedure. Warned about infection bleeding damage to structures skin hypopigmentation and fat atrophy among others. Patient expresses understanding and agreement Time-out conducted.   Noted Jacob [name] overlying erythema, induration, or other signs of local infection.   Skin prepped in a sterile fashion.   Local anesthesia: Topical Ethyl chloride.   With sterile technique and under real time ultrasound guidance:  40 mg of Kenalog and 1 mL Marcaine injected easily.   Completed without difficulty   Pain immediately resolved suggesting accurate placement of the medication.   Advised to call if fevers/chills, erythema, induration, drainage, or persistent bleeding.   Images permanently stored and available for review in the ultrasound unit.  Impression: Technically successful ultrasound guided injection.      Assessment and Plan: 39 y.o. male with wrist pain due to likely TFCC injury and extensor carpi ulnaris tendinitis. Plan for interarticular steroid injection today and continue physical therapy. Recheck back if not improved.   PDMP not reviewed this encounter. Orders Placed This Encounter  Procedures  . [place] LIMITED JOINT SPACE STRUCTURES UP LEFT(Jacob [name] LINKED CHARGES)    Order Specific Question:   Reason for Exam (SYMPTOM  OR DIAGNOSIS REQUIRED)    Answer:   left wrist pain    Order Specific Question:   Preferred imaging location?    Answer:   [facility]   Jacob [name] orders of the defined types were placed in this encounter.    Discussed warning signs or  symptoms. Please see discharge instructions. Patient expresses understanding.

## 2019-07-30 NOTE — Patient Instructions (Signed)
Thank you for coming in today.  Call or go to the ER if you develop a large red swollen joint with extreme pain or oozing puss.  Recheck as needed.  Continue PT.

## 2019-08-03 DIAGNOSIS — M25532 Pain in left wrist: Secondary | ICD-10-CM | POA: Diagnosis not present

## 2019-08-03 DIAGNOSIS — M25521 Pain in right elbow: Secondary | ICD-10-CM | POA: Diagnosis not present

## 2019-08-03 DIAGNOSIS — M7099 Unspecified soft tissue disorder related to use, overuse and pressure multiple sites: Secondary | ICD-10-CM | POA: Diagnosis not present

## 2019-08-03 DIAGNOSIS — M6281 Muscle weakness (generalized): Secondary | ICD-10-CM | POA: Diagnosis not present

## 2019-08-09 ENCOUNTER — Ambulatory Visit: Payer: BC Managed Care – PPO

## 2019-09-06 DIAGNOSIS — Z20822 Contact with and (suspected) exposure to covid-19: Secondary | ICD-10-CM | POA: Diagnosis not present

## 2019-10-10 ENCOUNTER — Other Ambulatory Visit: Payer: Self-pay | Admitting: Internal Medicine

## 2019-10-13 ENCOUNTER — Other Ambulatory Visit: Payer: Self-pay

## 2019-10-13 ENCOUNTER — Encounter: Payer: Self-pay | Admitting: Family Medicine

## 2019-10-13 ENCOUNTER — Ambulatory Visit: Payer: Self-pay

## 2019-10-13 ENCOUNTER — Ambulatory Visit (INDEPENDENT_AMBULATORY_CARE_PROVIDER_SITE_OTHER): Payer: BC Managed Care – PPO | Admitting: Family Medicine

## 2019-10-13 VITALS — BP 130/80 | HR 67 | Ht 69.0 in | Wt 189.0 lb

## 2019-10-13 DIAGNOSIS — M25511 Pain in right shoulder: Secondary | ICD-10-CM | POA: Diagnosis not present

## 2019-10-13 DIAGNOSIS — G8929 Other chronic pain: Secondary | ICD-10-CM

## 2019-10-13 DIAGNOSIS — M75111 Incomplete rotator cuff tear or rupture of right shoulder, not specified as traumatic: Secondary | ICD-10-CM | POA: Diagnosis not present

## 2019-10-13 MED ORDER — NITROGLYCERIN 0.2 MG/HR TD PT24
MEDICATED_PATCH | TRANSDERMAL | 1 refills | Status: DC
Start: 2019-10-13 — End: 2020-02-03

## 2019-10-13 NOTE — Patient Instructions (Addendum)
I've referred you to Physical Therapy.  Let us know if you don't hear from them in one week.   Nitroglycerin Protocol   Apply 1/4 nitroglycerin patch to affected area daily.  Change position of patch within the affected area every 24 hours.  You may experience a headache during the first 1-2 weeks of using the patch, these should subside.  If you experience headaches after beginning nitroglycerin patch treatment, you may take your preferred over the counter pain reliever.  Another side effect of the nitroglycerin patch is skin irritation or rash related to patch adhesive.  Please notify our office if you develop more severe headaches or rash, and stop the patch.  Tendon healing with nitroglycerin patch may require 12 to 24 weeks depending on the extent of injury.  Men should not use if taking Viagra, Cialis, or Levitra.   Do not use if you have migraines or rosacea.

## 2019-10-13 NOTE — Progress Notes (Signed)
I, [name], LAT, ATC, am serving as scribe for [name].  Jacob [name] is a 39 y.o. male who presents to [place] at [facility] today for R shoulder and upper arm pain.  He was last seen by Dr. Georgina Snell on 07/30/19 for his L wrist and had a L wrist injection.  Since then, he reports new issues w/ his R shoulder and R upper arm.  Pt plays tennis fairly competitively.  He states that his R shoulder has been bothering him for a few years but has worsened over the last few weeks.  He states that he he con't have some intermittent distal biceps/ant elbow pain.  He has been doing some theraband exercises and taking IBU.   Pertinent review of systems: No fevers or chills  Relevant historical information: Multiple orthopedic issues related to tennis playing.   Exam:  BP 130/80 (BP Location: Left Arm, Patient Position: Sitting, Cuff Size: Normal)   Pulse 67   Ht 5\' 9"  (1.753 m)   Wt 189 lb (85.7 kg)   SpO2 97%   BMI 27.91 kg/m  General: Well Developed, well nourished, and in no acute distress.   MSK: Right shoulder normal-appearing Nontender. Normal motion. Intact strength abduction external and internal rotation. Mildly positive empty can test mildly positive Hawkins and Neer's test. Negative Yergason's and speeds test.  Normal-appearing nontender normal motion and strength.    Lab and Radiology Results Diagnostic Limited MSK Ultrasound of: Right shoulder Biceps tendon intact normal-appearing Subscapularis tendon normal-appearing Supraspinatus tendon hypoechoic structure at the very superficial portion of tendon literally consistent with linear intrasubstance split tear or abnormal appearance of large subacromial bursitis. No significant tendon retraction present. Infraspinatus tendon is normal. AC joint normal-appearing Impression: Probable partial-thickness linear intrasubstance split tear supraspinatus tendon     Assessment and Plan: 39  y.o. male with right shoulder pain fortunately relatively mild but ongoing and nagging. 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 Ultrasound examination is more consistent with partial thickness linear split tear. Fortunately this should be well treatable conservatively with nitroglycerin patch protocol and physical therapy. If not improving after a good 4 to 6 weeks of physical therapy trial patient will notify me and we will proceed with MRI to further characterize cause of pain prior to proceeding with steroid injection planning. Would like to avoid steroid injection if possible if this truly is rotator cuff tear.  Chronic acute with exacerbation  Orders Placed This Encounter  Procedures  . [place] LIMITED JOINT SPACE STRUCTURES UP RIGHT(NO LINKED CHARGES)    Order Specific Question:   Reason for Exam (SYMPTOM  OR DIAGNOSIS REQUIRED)    Answer:   R shoulder pain    Order Specific Question:   Preferred imaging location?    Answer:   [place]  . Ambulatory referral to Physical Therapy    Referral Priority:   Routine    Referral Type:   Physical Medicine    Referral Reason:   Specialty Services Required    Requested Specialty:   Physical Therapy    Number of Visits Requested:   1   Meds ordered this encounter  Medications  . nitroGLYCERIN (NITRODUR - DOSED IN MG/24 HR) 0.2 mg/hr patch    Sig: Apply 1/4 patch daily to tendon for tendonitis.    Dispense:  30 patch    Refill:  1     Discussed warning signs or symptoms. Please see  discharge instructions. Patient expresses understanding.   The above documentation has been reviewed and is accurate and complete [name], M.D.

## 2019-10-19 DIAGNOSIS — M25511 Pain in right shoulder: Secondary | ICD-10-CM | POA: Diagnosis not present

## 2019-10-19 DIAGNOSIS — M75111 Incomplete rotator cuff tear or rupture of right shoulder, not specified as traumatic: Secondary | ICD-10-CM | POA: Diagnosis not present

## 2019-10-19 DIAGNOSIS — G8929 Other chronic pain: Secondary | ICD-10-CM | POA: Diagnosis not present

## 2019-10-22 DIAGNOSIS — M25511 Pain in right shoulder: Secondary | ICD-10-CM | POA: Diagnosis not present

## 2019-10-22 DIAGNOSIS — M75111 Incomplete rotator cuff tear or rupture of right shoulder, not specified as traumatic: Secondary | ICD-10-CM | POA: Diagnosis not present

## 2019-10-22 DIAGNOSIS — G8929 Other chronic pain: Secondary | ICD-10-CM | POA: Diagnosis not present

## 2019-10-27 DIAGNOSIS — M25532 Pain in left wrist: Secondary | ICD-10-CM

## 2019-10-27 DIAGNOSIS — M75111 Incomplete rotator cuff tear or rupture of right shoulder, not specified as traumatic: Secondary | ICD-10-CM | POA: Diagnosis not present

## 2019-10-27 DIAGNOSIS — M25511 Pain in right shoulder: Secondary | ICD-10-CM | POA: Diagnosis not present

## 2019-10-27 DIAGNOSIS — G8929 Other chronic pain: Secondary | ICD-10-CM

## 2019-10-29 DIAGNOSIS — E039 Hypothyroidism, unspecified: Secondary | ICD-10-CM | POA: Diagnosis not present

## 2019-10-29 DIAGNOSIS — E612 Magnesium deficiency: Secondary | ICD-10-CM | POA: Diagnosis not present

## 2019-10-29 DIAGNOSIS — E559 Vitamin D deficiency, unspecified: Secondary | ICD-10-CM | POA: Diagnosis not present

## 2019-10-29 DIAGNOSIS — F39 Unspecified mood [affective] disorder: Secondary | ICD-10-CM | POA: Diagnosis not present

## 2019-10-29 DIAGNOSIS — R5383 Other fatigue: Secondary | ICD-10-CM | POA: Diagnosis not present

## 2019-10-29 DIAGNOSIS — E291 Testicular hypofunction: Secondary | ICD-10-CM | POA: Diagnosis not present

## 2019-10-29 DIAGNOSIS — E538 Deficiency of other specified B group vitamins: Secondary | ICD-10-CM | POA: Diagnosis not present

## 2019-11-01 DIAGNOSIS — L918 Other hypertrophic disorders of the skin: Secondary | ICD-10-CM | POA: Diagnosis not present

## 2019-11-01 DIAGNOSIS — H6122 Impacted cerumen, left ear: Secondary | ICD-10-CM | POA: Diagnosis not present

## 2019-11-01 DIAGNOSIS — B353 Tinea pedis: Secondary | ICD-10-CM | POA: Diagnosis not present

## 2019-11-03 ENCOUNTER — Other Ambulatory Visit: Payer: Self-pay

## 2019-11-03 ENCOUNTER — Ambulatory Visit (INDEPENDENT_AMBULATORY_CARE_PROVIDER_SITE_OTHER): Payer: BC Managed Care – PPO

## 2019-11-03 ENCOUNTER — Telehealth: Payer: Self-pay

## 2019-11-03 ENCOUNTER — Telehealth: Payer: Self-pay | Admitting: Family Medicine

## 2019-11-03 DIAGNOSIS — M25532 Pain in left wrist: Secondary | ICD-10-CM

## 2019-11-03 DIAGNOSIS — M25511 Pain in right shoulder: Secondary | ICD-10-CM | POA: Diagnosis not present

## 2019-11-03 DIAGNOSIS — M75111 Incomplete rotator cuff tear or rupture of right shoulder, not specified as traumatic: Secondary | ICD-10-CM | POA: Diagnosis not present

## 2019-11-03 DIAGNOSIS — G8929 Other chronic pain: Secondary | ICD-10-CM | POA: Diagnosis not present

## 2019-11-03 NOTE — Progress Notes (Signed)
Wrist xray ordered to get MRI approved

## 2019-11-03 NOTE — Telephone Encounter (Signed)
Patient wanting to swithc to [facility] imaging. Needs arthrogram ordered for the wrist MRI W contrast. I have already changed the locations to [place] imaging for the MRI's

## 2019-11-03 NOTE — Telephone Encounter (Signed)
Mychart message sent to patient about MRI

## 2019-11-03 NOTE — Telephone Encounter (Signed)
Pt has talked to his insurance company and received Mining engineer for MRI's in the area.  He would like the shoulder MRI sent to [place]. He will also want the wrist MRI there as well, but I don't think this has been ordered yet ( he is coming here today for wrist xray).

## 2019-11-03 NOTE — Telephone Encounter (Signed)
Okay once patient gets the xray done I will pre cert for [place] imaging and call and change location for the shoulder MRI for [place] imaging as well.

## 2019-11-03 NOTE — Addendum Note (Signed)
Addended by: Pollyann Glen on: 11/03/2019 02:49 PM   Modules accepted: Orders

## 2019-11-04 NOTE — Telephone Encounter (Signed)
Free any day at noon except Friday

## 2019-11-04 NOTE — Telephone Encounter (Signed)
Arthrogram ordered

## 2019-11-04 NOTE — Telephone Encounter (Signed)
For some reason the pre cert was still denied so I will be setting up a peer to peer for next week do you have a day or time you prefer for next week

## 2019-11-04 NOTE — Addendum Note (Signed)
Addended by: Gregor Hams on: 11/04/2019 07:33 AM   Modules accepted: Orders

## 2019-11-05 NOTE — Progress Notes (Signed)
X-ray wrist normal-appearing.  Should be able to get MRI arthrogram approved

## 2019-11-08 ENCOUNTER — Telehealth: Payer: Self-pay | Admitting: Family Medicine

## 2019-11-08 NOTE — Telephone Encounter (Signed)
Doing peer to peer today

## 2019-11-08 NOTE — Telephone Encounter (Signed)
Peer to Peer discussion today for Wrist MRI arthrogram.   MRI approved.  Authorization #[id number] valid until April 2022

## 2019-11-11 DIAGNOSIS — E612 Magnesium deficiency: Secondary | ICD-10-CM | POA: Diagnosis not present

## 2019-11-11 DIAGNOSIS — E60 Dietary zinc deficiency: Secondary | ICD-10-CM | POA: Diagnosis not present

## 2019-11-11 DIAGNOSIS — E039 Hypothyroidism, unspecified: Secondary | ICD-10-CM | POA: Diagnosis not present

## 2019-11-11 DIAGNOSIS — E291 Testicular hypofunction: Secondary | ICD-10-CM | POA: Diagnosis not present

## 2019-11-12 DIAGNOSIS — E039 Hypothyroidism, unspecified: Secondary | ICD-10-CM | POA: Diagnosis not present

## 2019-11-12 DIAGNOSIS — D500 Iron deficiency anemia secondary to blood loss (chronic): Secondary | ICD-10-CM | POA: Diagnosis not present

## 2019-11-12 DIAGNOSIS — G8929 Other chronic pain: Secondary | ICD-10-CM | POA: Diagnosis not present

## 2019-11-12 DIAGNOSIS — M75111 Incomplete rotator cuff tear or rupture of right shoulder, not specified as traumatic: Secondary | ICD-10-CM | POA: Diagnosis not present

## 2019-11-12 DIAGNOSIS — M25511 Pain in right shoulder: Secondary | ICD-10-CM | POA: Diagnosis not present

## 2019-11-16 DIAGNOSIS — R790 Abnormal level of blood mineral: Secondary | ICD-10-CM | POA: Diagnosis not present

## 2019-11-17 ENCOUNTER — Other Ambulatory Visit: Payer: Self-pay

## 2019-11-17 ENCOUNTER — Encounter: Payer: Self-pay | Admitting: Internal Medicine

## 2019-11-17 ENCOUNTER — Ambulatory Visit (INDEPENDENT_AMBULATORY_CARE_PROVIDER_SITE_OTHER): Payer: BC Managed Care – PPO

## 2019-11-17 ENCOUNTER — Ambulatory Visit (INDEPENDENT_AMBULATORY_CARE_PROVIDER_SITE_OTHER): Payer: BC Managed Care – PPO | Admitting: Internal Medicine

## 2019-11-17 VITALS — BP 122/72 | HR 74 | Temp 98.2°F | Ht 69.0 in | Wt 191.0 lb

## 2019-11-17 DIAGNOSIS — S6702XA Crushing injury of left thumb, initial encounter: Secondary | ICD-10-CM

## 2019-11-17 DIAGNOSIS — Z23 Encounter for immunization: Secondary | ICD-10-CM | POA: Diagnosis not present

## 2019-11-17 DIAGNOSIS — S60012A Contusion of left thumb without damage to nail, initial encounter: Secondary | ICD-10-CM | POA: Diagnosis not present

## 2019-11-17 NOTE — Progress Notes (Signed)
Subjective:  Patient ID: Jacob [name], male    DOB: 05-Jan-1981  Age: 39 y.o. MRN: [id number]  CC: Finger Injury  This visit occurred during the SARS-CoV-2 public health emergency.  Safety protocols were in place, including screening questions prior to the visit, additional usage of staff PPE, and extensive cleaning of exam room while observing appropriate contact time as indicated for disinfecting solutions.    HPI Jacob [name] presents for concern about his left thumb.  He hit his left thumb with a tennis racquet 1 day prior to arrival.  He has pain and swelling on the dorsum of the thumb over the IP joint.  He had numbness distally but that has improved over the 24 hours since the injury.  He has not taken anything for pain and is not requesting anything for pain.  Outpatient Medications Prior to Visit  Medication Sig Dispense Refill  . clomiPHENE (CLOMID) 50 MG tablet Take 50 mg by mouth. 50 mg M,W,F and 25 mg T,Th,S,Su    . DULoxetine (CYMBALTA) 30 MG capsule Take 1 capsule (30 mg total) by mouth daily. Must keep appt for future refills 90 capsule 1  . levocetirizine (XYZAL) 5 MG tablet TAKE 2 TABLETS (10 MG TOTAL) BY MOUTH AT BEDTIME. 180 tablet 1  . nitroGLYCERIN (NITRODUR - DOSED IN MG/24 HR) 0.2 mg/hr patch Apply 1/4 patch daily to tendon for tendonitis. 30 patch 1  . pantoprazole (PROTONIX) 40 MG tablet Take one tablet by mouth 30 to 60 minutes before breakfast and dinner 180 tablet 5  . UNABLE TO FIND Three grains nature thyroid     No facility-administered medications prior to visit.    ROS Review of Systems  Musculoskeletal: Positive for arthralgias.  Skin: Positive for color change. Negative for wound.    Objective:  BP 122/72   Pulse 74   Temp 98.2 F (36.8 C) (Oral)   Ht 5\' 9"  (1.753 m)   Wt 191 lb (86.6 kg)   SpO2 97%   BMI 28.21 kg/m   BP Readings from Last 3 Encounters:  11/17/19 122/72  10/13/19 130/80  07/30/19 112/82    Wt Readings  from Last 3 Encounters:  11/17/19 191 lb (86.6 kg)  10/13/19 189 lb (85.7 kg)  07/30/19 193 lb (87.5 kg)    Physical Exam Musculoskeletal:     Comments: Dorsal surface of the left thumb over the IP joint shows mild ecchymosis, swelling, and tenderness.  There are no wounds or abrasions.  There is normal flexion and extension.  Distally there is good sensation and capillary refill.     Lab Results  Component Value Date   WBC 5.5 02/08/2019   HGB 17.0 02/08/2019   HCT 50.1 02/08/2019   PLT 225.0 02/08/2019   GLUCOSE 102 (H) 02/08/2019   CHOL 179 02/08/2019   TRIG 107.0 02/08/2019   HDL 57.60 02/08/2019   LDLCALC 100 (H) 02/08/2019   ALT 18 10/14/2016   AST 25 10/14/2016   NA 139 02/08/2019   K 3.8 02/08/2019   CL 104 02/08/2019   CREATININE 1.00 02/08/2019   BUN 18 02/08/2019   CO2 25 02/08/2019   TSH 6.78 (H) 02/08/2019   HGBA1C 5.3 10/14/2016    CT CARDIAC SCORING  Addendum Date: 03/19/2019   ADDENDUM REPORT: 03/19/2019 22:07 CLINICAL DATA:  Risk stratification EXAM: Coronary Calcium Score TECHNIQUE: The patient was scanned on a [employer] scanner. Axial non-contrast 3 mm slices were carried out through the heart. The data  set was analyzed on a dedicated work station and scored using the [employer]. FINDINGS: Non-cardiac: See separate report from [facility]. Ascending Aorta: Normal size, no calcifications. Pericardium: Normal. Coronary arteries: Normal origin. IMPRESSION: Coronary calcium score of 0. This was 0 percentile for age and sex matched control. Electronically Signed   By: Ena Dawley   On: 03/19/2019 22:07   Result Date: 03/19/2019 EXAM: OVER-READ INTERPRETATION  CT CHEST The following report is an over-read performed by radiologist Dr. Suzy Bouchard of [facility], [place] on 03/18/2019. This over-read does not include interpretation of cardiac or coronary anatomy or pathology. The coronary calcium score interpretation by the cardiologist  is attached. COMPARISON:  None. FINDINGS: Limited view of the lung parenchyma demonstrates no suspicious nodularity. Airways are normal. Limited view of the mediastinum demonstrates no adenopathy. Esophagus normal. Limited view of the upper abdomen unremarkable. Limited view of the skeleton and chest wall is unremarkable. IMPRESSION: No significant extracardiac findings. Electronically Signed: By: Suzy Bouchard M.D. On: 03/18/2019 14:09   ECHOCARDIOGRAM COMPLETE  Result Date: 03/18/2019    ECHOCARDIOGRAM REPORT   Patient Name:   CARDEN TEEL Date of Exam: 03/18/2019 Medical Rec #:  [id number]           Height:       70.0 in Accession #:    [id number]          Weight:       188.0 lb Date of Birth:  12/19/1980            BSA:          2.033 m Patient Age:    52 years            BP:           138/86 mmHg Patient Gender: M                   HR:           61 bpm. Exam Location:  [place] Procedure: 2D Echo, 3D Echo, Cardiac Doppler, Color Doppler and Strain Analysis Indications:    Z82.49 Family History of Coronary Artery Disease.  History:        Patient has no prior history of Echocardiogram examinations.  Sonographer:    Cresenciano Lick RDCS Referring Phys: [id number] [employer]  1. Left ventricular ejection fraction, by estimation, is 60 to 65%. Left ventricular ejection fraction by 3D volume is 62 %. The left ventricle has normal function. The left ventricle has no regional wall motion abnormalities. Left ventricular diastolic  parameters were normal. The average left ventricular global longitudinal strain is -23.5 %.  2. Right ventricular systolic function is normal. The right ventricular size is normal. Tricuspid regurgitation signal is inadequate for assessing PA pressure.  3. The mitral valve is grossly normal. Trivial mitral valve regurgitation. No evidence of mitral stenosis.  4. The aortic valve is tricuspid. Aortic valve regurgitation is not visualized. No aortic  stenosis is present.  5. The inferior vena cava is normal in size with greater than 50% respiratory variability, suggesting right atrial pressure of 3 mmHg. Conclusion(s)/Recommendation(s): Normal biventricular function without evidence of hemodynamically significant valvular heart disease. FINDINGS  Left Ventricle: Left ventricular ejection fraction, by estimation, is 60 to 65%. Left ventricular ejection fraction by 3D volume is 62 % The left ventricle has normal function. The left ventricle has no regional wall motion abnormalities. The average left ventricular global longitudinal strain is -23.5 %. The  left ventricular internal cavity size was normal in size. There is no left ventricular hypertrophy. Left ventricular diastolic parameters were normal. Normal left ventricular filling pressure. Right Ventricle: The right ventricular size is normal. No increase in right ventricular wall thickness. Right ventricular systolic function is normal. Tricuspid regurgitation signal is inadequate for assessing PA pressure. Left Atrium: Left atrial size was normal in size. Right Atrium: Right atrial size was normal in size. Pericardium: There is no evidence of pericardial effusion. Mitral Valve: The mitral valve is grossly normal. There is mild thickening of the mitral valve leaflet(s). Trivial mitral valve regurgitation. No evidence of mitral valve stenosis. Tricuspid Valve: The tricuspid valve is grossly normal. Tricuspid valve regurgitation is trivial. No evidence of tricuspid stenosis. Aortic Valve: The aortic valve is tricuspid. Aortic valve regurgitation is not visualized. No aortic stenosis is present. Aortic valve mean gradient measures 3.4 mmHg. Aortic valve peak gradient measures 5.8 mmHg. Aortic valve area, by VTI measures 2.09 cm. Pulmonic Valve: The pulmonic valve was grossly normal. Pulmonic valve regurgitation is not visualized. No evidence of pulmonic stenosis. Aorta: The aortic root is normal in size and  structure. Venous: The inferior vena cava is normal in size with greater than 50% respiratory variability, suggesting right atrial pressure of 3 mmHg. IAS/Shunts: No atrial level shunt detected by color flow Doppler.  LEFT VENTRICLE PLAX 2D LVIDd:         5.30 cm         Diastology LVIDs:         3.40 cm         LV e' lateral:   19.00 cm/s LV PW:         0.90 cm         LV E/e' lateral: 4.0 LV IVS:        0.70 cm         LV e' medial:    10.20 cm/s LVOT diam:     2.20 cm         LV E/e' medial:  7.5 LV SV:         54 LV SV Index:   27              2D LVOT Area:     3.80 cm        Longitudinal                                Strain                                2D Strain GLS  -23.9 %                                (A2C):                                2D Strain GLS  -25.9 %                                (A3C):  2D Strain GLS  -20.8 %                                (A4C):                                2D Strain GLS  -23.5 %                                Avg:                                 3D Volume EF                                LV 3D EF:    Left                                             ventricular                                             ejection                                             fraction by                                             3D volume                                             is 62 %                                 3D Volume EF:                                3D EF:        62 %                                LV EDV:       154 ml                                LV ESV:       59 ml                                LV  SV:        95 ml RIGHT VENTRICLE RV Basal diam:  3.60 cm RV S prime:     12.10 cm/s TAPSE (M-mode): 2.4 cm LEFT ATRIUM             Index       RIGHT ATRIUM           Index LA diam:        3.60 cm 1.77 cm/m  RA Area:     14.40 cm LA Vol (A2C):   41.9 ml 20.61 ml/m RA Volume:   41.10 ml  20.21 ml/m LA Vol (A4C):   43.2 ml 21.25 ml/m LA  Biplane Vol: 42.8 ml 21.05 ml/m  AORTIC VALVE AV Area (Vmax):    2.15 cm AV Area (Vmean):   2.09 cm AV Area (VTI):     2.09 cm AV Vmax:           120.23 cm/s AV Vmean:          87.506 cm/s AV VTI:            0.259 m AV Peak Grad:      5.8 mmHg AV Mean Grad:      3.4 mmHg LVOT Vmax:         67.90 cm/s LVOT Vmean:        48.150 cm/s LVOT VTI:          0.143 m LVOT/AV VTI ratio: 0.55  AORTA Ao Root diam: 3.30 cm MITRAL VALVE MV Area (PHT): 2.50 cm    SHUNTS MV Decel Time: 303 msec    Systemic VTI:  0.14 m MV E velocity: 76.40 cm/s  Systemic Diam: 2.20 cm MV A velocity: 44.20 cm/s MV E/A ratio:  1.73 Eleonore Chiquito MD Electronically signed by Eleonore Chiquito MD Signature Date/Time: 03/18/2019/4:43:41 PM    Final    DG Finger Thumb Left  Result Date: 11/17/2019 CLINICAL DATA:  Thumb injury yesterday with pain, swelling and bruising EXAM: LEFT THUMB 2+V COMPARISON:  None. FINDINGS: There is no evidence of fracture or dislocation. There is no evidence of arthropathy or other focal bone abnormality. Soft tissues are unremarkable. IMPRESSION: No left thumb fracture or dislocation. Electronically Signed   By: Ilona Sorrel M.D.   On: 11/17/2019 14:25    Assessment & Plan:   Laurence was seen today for finger injury.  Diagnoses and all orders for this visit:  Crushing injury of left thumb, initial encounter- Based on his symptoms, exam, and plain films this is a contusion.  He will take over-the-counter doses of ibuprofen and Tylenol for the pain.  He will rest ice and elevate.  He will let me know if he develops any new or worsening symptoms. -     DG Finger Thumb Left; Future  Other orders -     Flu Vaccine QUAD 6+ mos PF IM (Fluarix Quad PF)   I am having Agapito Hanway. Damron maintain his clomiPHENE, UNABLE TO FIND, DULoxetine, pantoprazole, levocetirizine, and nitroGLYCERIN.  No orders of the defined types were placed in this encounter.    Follow-up: No follow-ups on file.  Scarlette Calico, MD

## 2019-11-18 ENCOUNTER — Encounter: Payer: Self-pay | Admitting: Internal Medicine

## 2019-11-23 ENCOUNTER — Other Ambulatory Visit: Payer: Self-pay

## 2019-11-23 ENCOUNTER — Ambulatory Visit
Admission: RE | Admit: 2019-11-23 | Discharge: 2019-11-23 | Disposition: A | Discharge status: Home / Self Care | Payer: BC Managed Care – PPO | Source: Ambulatory Visit | Attending: Family Medicine | Admitting: Family Medicine

## 2019-11-23 DIAGNOSIS — M75111 Incomplete rotator cuff tear or rupture of right shoulder, not specified as traumatic: Secondary | ICD-10-CM

## 2019-11-23 DIAGNOSIS — M67811 Other specified disorders of synovium, right shoulder: Secondary | ICD-10-CM | POA: Diagnosis not present

## 2019-11-23 DIAGNOSIS — G8929 Other chronic pain: Secondary | ICD-10-CM

## 2019-11-23 DIAGNOSIS — M25532 Pain in left wrist: Secondary | ICD-10-CM

## 2019-11-24 DIAGNOSIS — R790 Abnormal level of blood mineral: Secondary | ICD-10-CM | POA: Diagnosis not present

## 2019-11-25 NOTE — Progress Notes (Signed)
MRI shoulder shows mild tendinitis of the supraspinatus tendon with a small partial tear.  Additionally it shows medium tendinitis of the infraspinatus tendon and mild tendinitis of the subscapularis tendon with a small tear.  These are the 3 major rotator cuff tendons.  Typically this will improve with physical therapy.  Could consider surgery to give the rotator cuff more room if needed.  Recommend that you schedule a follow-up appointment with me after your MRI of your wrist on the 17th.

## 2019-12-01 DIAGNOSIS — R790 Abnormal level of blood mineral: Secondary | ICD-10-CM | POA: Diagnosis not present

## 2019-12-02 ENCOUNTER — Encounter: Payer: Self-pay | Admitting: Internal Medicine

## 2019-12-03 ENCOUNTER — Other Ambulatory Visit: Payer: Self-pay | Admitting: Internal Medicine

## 2019-12-03 DIAGNOSIS — B353 Tinea pedis: Secondary | ICD-10-CM

## 2019-12-03 MED ORDER — KETOCONAZOLE 2 % EX CREA: 1.0000 "application " | TOPICAL_CREAM | Freq: Two times a day (BID) | CUTANEOUS | 1 refills | Status: DC

## 2019-12-06 ENCOUNTER — Encounter: Payer: Self-pay | Admitting: Family Medicine

## 2019-12-06 NOTE — Progress Notes (Signed)
I, Jacob [name], LAT, ATC acting as a scribe for Jacob Leader, MD.  Jacob [name] is a 39 y.o. male who presents to [place] at [facility] today for f/u of chronic R shoulder/upper arm and elbow pn.  Pt was previously seen on 07/30/19 for his L wrist and had a L wrist injection. Pt was last seen by Dr. Georgina Snell 10/13/19 and was treatable conservatively with nitroglycerin patch protocol and PT, of which he's completed 0 visits. Pt is R hand dominate. Pt is an active tennis player and weight lifter. Today, pt reports R elbow pn intensely hurting for past [redacted] weeks along the ulnar groove/posterior aspect. No numbness. Activity does not aggravate.  Pt also c/o R anterior shoulder pn. Aggravates overhead press and lateral delt raises. No known MOI. Review MRI.  Dx imaging: 11/23/19 R shoulder MRI  Pertinent review of systems: No fevers or chills  Relevant historical information: Avid tennis player.   Exam:  BP (!) 144/80 (BP Location: Right Arm, Patient Position: Sitting, Cuff Size: Normal)   Pulse 61   Ht 5\' 9"  (1.753 m)   Wt 190 lb 6.4 oz (86.4 kg)   SpO2 99%   BMI 28.12 kg/m  General: Well Developed, well nourished, and in no acute distress.   MSK: Right shoulder normal-appearing normal motion pain with abduction. Right elbow normal-appearing normal motion.  Tender palpation lateral epicondyle.  Some pain with resisted wrist extension and elbow extension.    Lab and Radiology Results  Procedure: Real-time Ultrasound Guided Injection of right shoulder subacromial bursa Device: Philips Affiniti 50G Images permanently stored and available for review in PACS Verbal informed consent obtained.  Discussed risks and benefits of procedure. Warned about infection bleeding damage to structures skin hypopigmentation and fat atrophy among others. Patient expresses understanding and agreement Time-out conducted.   Noted no overlying erythema, induration, or other signs of  local infection.   Skin prepped in a sterile fashion.   Local anesthesia: Topical Ethyl chloride.   With sterile technique and under real time ultrasound guidance:  40 mg of Kenalog and 2 mL of Marcaine injected into bursa. Fluid seen entering the bursa sac.   Completed without difficulty   Pain immediately resolved suggesting accurate placement of the medication.   Advised to call if fevers/chills, erythema, induration, drainage, or persistent bleeding.   Images permanently stored and available for review in the ultrasound unit.  Impression: Technically successful ultrasound guided injection.   Procedure: Real-time Ultrasound Guided Injection of right elbow lateral epicondyle Device: Philips Affiniti 50G Images permanently stored and available for review in PACS Verbal informed consent obtained.  Discussed risks and benefits of procedure. Warned about infection bleeding damage to structures skin hypopigmentation and fat atrophy among others. Patient expresses understanding and agreement Time-out conducted.   Noted no overlying erythema, induration, or other signs of local infection.   Skin prepped in a sterile fashion.   Local anesthesia: Topical Ethyl chloride.   With sterile technique and under real time ultrasound guidance:  40 mg of Kenalog and 2 mL of Marcaine injected into the lateral epicondyle. Fluid seen entering the lateral epicondyle, tensor tendon insertion Completed without difficulty   Pain immediately resolved suggesting accurate placement of the medication.   Advised to call if fevers/chills, erythema, induration, drainage, or persistent bleeding.   Images permanently stored and available for review in the ultrasound unit.  Impression: Technically successful ultrasound guided injection.  EXAM: MRI OF THE RIGHT SHOULDER WITHOUT CONTRAST  TECHNIQUE: Multiplanar, multisequence MR imaging of the shoulder was performed. No intravenous contrast was  administered.  COMPARISON:  None.  FINDINGS: Rotator cuff: Mild tendinosis of the supraspinatus tendon with a small partial-thickness or bursal surface tear anteriorly. Moderate tendinosis of the infraspinatus tendon. Teres minor tendon is intact. Mild tendinosis of the subscapularis tendon with a small interstitial tear.  Muscles: No muscle atrophy or edema. No intramuscular fluid collection or hematoma.  Biceps Long Head: Intraarticular and extraarticular portions of the biceps tendon are intact.  Acromioclavicular Joint: Moderate arthropathy of the acromioclavicular joint. Type II acromion. No subacromial/subdeltoid bursal fluid.  Glenohumeral Joint: No joint effusion. No chondral defect.  Labrum: Small posterior labral tear.  Bones: No fracture or dislocation. No aggressive osseous lesion.  Other: No fluid collection or hematoma.  IMPRESSION: 1. Mild tendinosis of the supraspinatus tendon with a small partial-thickness or bursal surface tear anteriorly. 2. Moderate tendinosis of the infraspinatus tendon. 3. Mild tendinosis of the subscapularis tendon with a small interstitial tear.   Electronically Signed   By: Kathreen Devoid   On: 11/24/2019 11:24 I, Jacob [name], personally (independently) visualized and performed the interpretation of the images attached in this note.    Assessment and Plan: 39 y.o. male with right shoulder pain due to bursitis and rotator cuff tendinitis and tiny rotator cuff tear.  Discussed options.  Plan for injection today patient to continuing physical therapy.  However he does have impingement changes seen on MRI and may benefit from surgical consultation to discuss subacromial decompression and distal clavicle excision and possible rotator cuff repair.  He would like to avoid prolonged recovery course following surgery if possible.  Reasonable to have discussion with surgery.  He has a family relationship with Dr. Onnie Graham.  Referral  there.  Elbow pain: Ongoing and tennis player.  Most consistent with lateral epicondylitis.  Plan for injection today in addition to his home exercise program.  PDMP not reviewed this encounter. Orders Placed This Encounter  Procedures  . [place] LIMITED JOINT SPACE STRUCTURES UP BILAT(NO LINKED CHARGES)    Standing Status:   Future    Number of Occurrences:   1    Standing Expiration Date:   06/05/2020    Order Specific Question:   Reason for Exam (SYMPTOM  OR DIAGNOSIS REQUIRED)    Answer:   Bilateral elbow pain    Order Specific Question:   Preferred imaging location?    Answer:   [place]  . Ambulatory referral to Orthopedic Surgery    Referral Priority:   Routine    Referral Type:   Surgical    Referral Reason:   Specialty Services Required    Referred to Provider:   Justice Britain, MD    Requested Specialty:   Orthopedic Surgery    Number of Visits Requested:   1   No orders of the defined types were placed in this encounter.    Discussed warning signs or symptoms. Please see discharge instructions. Patient expresses understanding.   The above documentation has been reviewed and is accurate and complete Jacob [name], M.D.

## 2019-12-07 ENCOUNTER — Ambulatory Visit: Payer: Self-pay

## 2019-12-07 ENCOUNTER — Other Ambulatory Visit: Payer: Self-pay

## 2019-12-07 ENCOUNTER — Ambulatory Visit (INDEPENDENT_AMBULATORY_CARE_PROVIDER_SITE_OTHER): Payer: BC Managed Care – PPO | Admitting: Family Medicine

## 2019-12-07 VITALS — BP 144/80 | HR 61 | Ht 69.0 in | Wt 190.4 lb

## 2019-12-07 DIAGNOSIS — M25521 Pain in right elbow: Secondary | ICD-10-CM

## 2019-12-07 DIAGNOSIS — M25511 Pain in right shoulder: Secondary | ICD-10-CM | POA: Diagnosis not present

## 2019-12-07 DIAGNOSIS — G8929 Other chronic pain: Secondary | ICD-10-CM

## 2019-12-07 NOTE — Patient Instructions (Signed)
Thank you for coming in today.  Have the consultation with Dr Onnie Graham.  I will get the wrist MRI results to you.   Call or go to the ER if you develop a large red swollen joint with extreme pain or oozing puss.   Keep me updated.

## 2019-12-08 ENCOUNTER — Ambulatory Visit
Admission: RE | Admit: 2019-12-08 | Discharge: 2019-12-08 | Disposition: A | Discharge status: Home / Self Care | Payer: BC Managed Care – PPO | Source: Ambulatory Visit | Attending: Family Medicine | Admitting: Family Medicine

## 2019-12-08 ENCOUNTER — Encounter: Payer: Self-pay | Admitting: Internal Medicine

## 2019-12-08 DIAGNOSIS — M25532 Pain in left wrist: Secondary | ICD-10-CM | POA: Diagnosis not present

## 2019-12-08 DIAGNOSIS — S63592A Other specified sprain of left wrist, initial encounter: Secondary | ICD-10-CM | POA: Diagnosis not present

## 2019-12-08 MED ORDER — IOPAMIDOL (ISOVUE-M 200) INJECTION 41%
2.0000 mL | Freq: Once | INTRAMUSCULAR | Status: AC
  Administered 2019-12-08: 2 mL via INTRA_ARTICULAR

## 2019-12-10 ENCOUNTER — Other Ambulatory Visit: Payer: Self-pay | Admitting: Internal Medicine

## 2019-12-10 DIAGNOSIS — I10 Essential (primary) hypertension: Secondary | ICD-10-CM | POA: Insufficient documentation

## 2019-12-10 DIAGNOSIS — R790 Abnormal level of blood mineral: Secondary | ICD-10-CM | POA: Diagnosis not present

## 2019-12-10 MED ORDER — CANDESARTAN CILEXETIL 16 MG PO TABS: 16.0000 mg | ORAL_TABLET | Freq: Every day | ORAL | 1 refills | Status: DC

## 2019-12-10 NOTE — Progress Notes (Signed)
MRI wrist shows tendinitis of the wrist extensor.  The ligaments and TFCC are intact.  Carpal tunnel looks normal.  Main finding is wrist tendinitis.

## 2019-12-23 DIAGNOSIS — G8929 Other chronic pain: Secondary | ICD-10-CM | POA: Diagnosis not present

## 2019-12-23 DIAGNOSIS — M75111 Incomplete rotator cuff tear or rupture of right shoulder, not specified as traumatic: Secondary | ICD-10-CM | POA: Diagnosis not present

## 2019-12-23 DIAGNOSIS — M25511 Pain in right shoulder: Secondary | ICD-10-CM | POA: Diagnosis not present

## 2019-12-29 DIAGNOSIS — M25511 Pain in right shoulder: Secondary | ICD-10-CM | POA: Diagnosis not present

## 2020-01-04 ENCOUNTER — Other Ambulatory Visit: Payer: Self-pay | Admitting: Internal Medicine

## 2020-01-04 DIAGNOSIS — F418 Other specified anxiety disorders: Secondary | ICD-10-CM

## 2020-01-05 DIAGNOSIS — R790 Abnormal level of blood mineral: Secondary | ICD-10-CM | POA: Diagnosis not present

## 2020-01-06 DIAGNOSIS — M25511 Pain in right shoulder: Secondary | ICD-10-CM | POA: Diagnosis not present

## 2020-01-06 DIAGNOSIS — M75111 Incomplete rotator cuff tear or rupture of right shoulder, not specified as traumatic: Secondary | ICD-10-CM | POA: Diagnosis not present

## 2020-01-06 DIAGNOSIS — G8929 Other chronic pain: Secondary | ICD-10-CM | POA: Diagnosis not present

## 2020-01-18 DIAGNOSIS — Z79899 Other long term (current) drug therapy: Secondary | ICD-10-CM | POA: Diagnosis not present

## 2020-01-18 DIAGNOSIS — L700 Acne vulgaris: Secondary | ICD-10-CM | POA: Diagnosis not present

## 2020-02-03 ENCOUNTER — Encounter: Payer: Self-pay | Admitting: Internal Medicine

## 2020-02-03 ENCOUNTER — Other Ambulatory Visit: Payer: Self-pay

## 2020-02-03 ENCOUNTER — Ambulatory Visit (INDEPENDENT_AMBULATORY_CARE_PROVIDER_SITE_OTHER): Payer: BC Managed Care – PPO | Admitting: Internal Medicine

## 2020-02-03 VITALS — BP 118/72 | HR 65 | Temp 97.6°F | Ht 69.0 in | Wt 188.0 lb

## 2020-02-03 DIAGNOSIS — I10 Essential (primary) hypertension: Secondary | ICD-10-CM

## 2020-02-03 DIAGNOSIS — E039 Hypothyroidism, unspecified: Secondary | ICD-10-CM

## 2020-02-03 LAB — BASIC METABOLIC PANEL
BUN: 17 mg/dL (ref 6–23)
CO2: 29 mEq/L (ref 19–32)
Calcium: 9.3 mg/dL (ref 8.4–10.5)
Chloride: 103 mEq/L (ref 96–112)
Creatinine, Ser: 1.16 mg/dL (ref 0.40–1.50)
GFR: 79.24 mL/min (ref >60.00)
Glucose, Bld: 93 mg/dL (ref 70–99)
Potassium: 3.9 mEq/L (ref 3.5–5.1)
Sodium: 138 mEq/L (ref 135–145)

## 2020-02-03 LAB — CBC WITH DIFFERENTIAL/PLATELET
Basophils Absolute: 0 10*3/uL (ref 0.0–0.1)
Basophils Relative: 0.6 % (ref 0.0–3.0)
Eosinophils Absolute: 0.2 10*3/uL (ref 0.0–0.7)
Eosinophils Relative: 2.8 % (ref 0.0–5.0)
HCT: 48.8 % (ref 39.0–52.0)
Hemoglobin: 16.6 g/dL (ref 13.0–17.0)
Lymphocytes Relative: 54.9 % — ABNORMAL HIGH (ref 12.0–46.0)
Lymphs Abs: 3.3 10*3/uL (ref 0.7–4.0)
MCHC: 33.9 g/dL (ref 30.0–36.0)
MCV: 90.2 fl (ref 78.0–100.0)
Monocytes Absolute: 0.7 10*3/uL (ref 0.1–1.0)
Monocytes Relative: 11.9 % (ref 3.0–12.0)
Neutro Abs: 1.8 10*3/uL (ref 1.4–7.7)
Neutrophils Relative %: 29.8 % — ABNORMAL LOW (ref 43.0–77.0)
Platelets: 244 10*3/uL (ref 150.0–400.0)
RBC: 5.41 Mil/uL (ref 4.22–5.81)
RDW: 14.6 % (ref 11.5–15.5)
WBC: 6 10*3/uL (ref 4.0–10.5)

## 2020-02-03 NOTE — Patient Instructions (Signed)

## 2020-02-04 LAB — THYROID PANEL WITH TSH
Free Thyroxine Index: 2.1 (ref 1.4–3.8)
T3 Uptake: 29 % (ref 22–35)
T4, Total: 7.3 ug/dL (ref 4.9–10.5)
TSH: 5.19 mIU/L — ABNORMAL HIGH (ref 0.40–4.50)

## 2020-02-04 NOTE — Progress Notes (Signed)
Subjective:  Patient ID: Jacob [name], male    DOB: 1980/10/09  Age: 40 y.o. MRN: [id number]  CC: Hypertension  This visit occurred during the SARS-CoV-2 public health emergency.  Safety protocols were in place, including screening questions prior to the visit, additional usage of staff PPE, and extensive cleaning of exam room while observing appropriate contact time as indicated for disinfecting solutions.    HPI Jacob [name] presents for f/up - He tells me his blood pressure has been well controlled.  He tells me he took a close of clonidine the night prior to this visit.  I have not prescribed clonidine.  He tells me he is taking the ARB.  He denies any recent episodes of dizziness, lightheadedness, chest pain, shortness of breath, palpitation, edema, or fatigue.   Outpatient Medications Prior to Visit  Medication Sig Dispense Refill  . clomiPHENE (CLOMID) 50 MG tablet Take 50 mg by mouth. 50 mg M,W,F and 25 mg T,Th,S,Su    . DULoxetine (CYMBALTA) 30 MG capsule TAKE 1 CAPSULE (30 MG TOTAL) BY MOUTH DAILY. MUST KEEP APPT FOR FUTURE REFILLS 90 capsule 1  . levocetirizine (XYZAL) 5 MG tablet TAKE 2 TABLETS (10 MG TOTAL) BY MOUTH AT BEDTIME. 180 tablet 1  . pantoprazole (PROTONIX) 40 MG tablet Take one tablet by mouth 30 to 60 minutes before breakfast and dinner 180 tablet 5  . UNABLE TO FIND Three grains nature thyroid    . candesartan (ATACAND) 16 MG tablet Take 1 tablet (16 mg total) by mouth daily. 90 tablet 1  . ketoconazole (NIZORAL) 2 % cream Apply 1 application topically 2 (two) times daily. 60 g 1  . nitroGLYCERIN (NITRODUR - DOSED IN MG/24 HR) 0.2 mg/hr patch Apply 1/4 patch daily to tendon for tendonitis. 30 patch 1   No facility-administered medications prior to visit.    ROS Review of Systems  Constitutional: Negative for appetite change, diaphoresis, fatigue and fever.  HENT: Negative.   Eyes: Negative for visual disturbance.  Respiratory: Negative for cough,  chest tightness, shortness of breath and wheezing.   Cardiovascular: Negative for chest pain, palpitations and leg swelling.  Gastrointestinal: Negative for abdominal pain, constipation, diarrhea, nausea and vomiting.  Endocrine: Negative for cold intolerance and heat intolerance.  Genitourinary: Negative.  Negative for difficulty urinating.  Musculoskeletal: Negative for arthralgias, back pain and myalgias.  Skin: Negative for color change, pallor and rash.  Neurological: Negative.  Negative for dizziness and weakness.  Hematological: Negative for adenopathy. Does not bruise/bleed easily.  Psychiatric/Behavioral: Negative.  Negative for agitation and dysphoric mood.    Objective:  BP 118/72   Pulse 65   Temp 97.6 F (36.4 C) (Oral)   Ht 5\' 9"  (1.753 m)   Wt 188 lb (85.3 kg)   SpO2 97%   BMI 27.76 kg/m   BP Readings from Last 3 Encounters:  02/03/20 118/72  12/07/19 (!) 144/80  11/17/19 122/72    Wt Readings from Last 3 Encounters:  02/03/20 188 lb (85.3 kg)  12/07/19 190 lb 6.4 oz (86.4 kg)  11/17/19 191 lb (86.6 kg)    Physical Exam Vitals reviewed.  HENT:     Nose: Nose normal.     Mouth/Throat:     Mouth: Mucous membranes are moist.  Eyes:     General: No scleral icterus.    Conjunctiva/sclera: Conjunctivae normal.  Cardiovascular:     Rate and Rhythm: Normal rate and regular rhythm.     Heart sounds: No murmur heard.  Pulmonary:     Effort: Pulmonary effort is normal.     Breath sounds: No stridor. No wheezing, rhonchi or rales.  Abdominal:     General: Abdomen is flat. Bowel sounds are normal. There is no distension.     Palpations: Abdomen is soft. There is no hepatomegaly, splenomegaly or mass.     Tenderness: There is no abdominal tenderness.  Musculoskeletal:        General: Normal range of motion.     Cervical back: Neck supple.     Right lower leg: No edema.     Left lower leg: No edema.  Lymphadenopathy:     Cervical: No cervical adenopathy.   Skin:    General: Skin is warm and dry.     Coloration: Skin is not pale.  Neurological:     General: No focal deficit present.     Mental Status: He is alert.  Psychiatric:        Mood and Affect: Mood normal.        Behavior: Behavior normal.     Lab Results  Component Value Date   WBC 6.0 02/03/2020   HGB 16.6 02/03/2020   HCT 48.8 02/03/2020   PLT 244.0 02/03/2020   GLUCOSE 93 02/03/2020   CHOL 179 02/08/2019   TRIG 107.0 02/08/2019   HDL 57.60 02/08/2019   LDLCALC 100 (H) 02/08/2019   ALT 18 10/14/2016   AST 25 10/14/2016   NA 138 02/03/2020   K 3.9 02/03/2020   CL 103 02/03/2020   CREATININE 1.16 02/03/2020   BUN 17 02/03/2020   CO2 29 02/03/2020   TSH 5.19 (H) 02/03/2020   HGBA1C 5.3 10/14/2016    MR WRIST LEFT W CONTRAST  Result Date: 12/10/2019 CLINICAL DATA:  Ulnar wrist pain for 8-9 months. Remote arthroscopy. Carpal tunnel syndrome suspected. EXAM: MRI OF THE LEFT WRIST WITH CONTRAST (MR Arthrogram) TECHNIQUE: Multiplanar, multisequence MR imaging of the wrist was performed immediately following contrast injection into the radiocarpal joint under fluoroscopic guidance. No intravenous contrast was administered. COMPARISON:  Injection images same date. Wrist radiographs 11/03/2019. FINDINGS: Ligaments: The scapholunate and lunotriquetral ligaments appear intact. There is no contrast in the midcarpal compartment. Triangular fibrocartilage: The triangular fibrocartilage complex appears intact. There is no contrast in the distal radioulnar compartment. The ulnar variance is neutral. Tendons: There is dorsal leakage of contrast at the injection site with partial opacification of the extensor tendon sheaths. Possible mild extensor carpi ulnaris tenosynovitis without tear. The extensor tendons otherwise appear normal. The flexor tendons appear normal. Carpal tunnel/median nerve: Unremarkable. Guyon's canal: Unremarkable. Joint/cartilage: The radiocarpal joint is well  opacified with contrast. No focal chondral defect. No opacification of the midcarpal or distal radioulnar compartments. Bones/carpal alignment: The carpal bone alignment is normal.There are no significant extra-articular osseous findings. Other: The periarticular soft tissues appear normal. No ganglia are identified. IMPRESSION: 1. Possible mild extensor carpi ulnaris tenosynovitis without tear. 2. The intercarpal and TFCC appear intact. 3. The contents of the carpal tunnel appear normal. Electronically Signed   By: Richardean Sale M.D.   On: 12/10/2019 09:07   DG FLUORO GUIDED NEEDLE PLC ASPIRATION/INJECTION LOC  Result Date: 12/08/2019 CLINICAL DATA:  Left wrist pain. FLUOROSCOPY TIME:  Fluoroscopy Time: 10 seconds Radiation Exposure Index: 0.26 microGray*m^2 PROCEDURE: LEFT WRIST INJECTION UNDER FLUOROSCOPY An appropriate skin entrance site was determined. The site was marked, prepped with Betadine, draped in the usual sterile fashion, and infiltrated locally with 1% Lidocaine. A 25 gauge skin needle  was advanced into the radiocarpal joint under intermittent fluoroscopy. A mixture of 0.1 mL of MultiHance, 15 mL of Isovue-M 200, and 5 mL of sterile saline was then used to opacify the proximal carpal joint. 2 mL of this mixture were injected. No immediate complication. IMPRESSION: Technically successful left wrist injection for MRI. Electronically Signed   By: Logan Bores M.D.   On: 12/08/2019 16:28    Assessment & Plan:   Jacob [name] was seen today for hypertension.  Diagnoses and all orders for this visit:  Essential hypertension- His blood pressure is somewhat overcontrolled.  I recommended that he stop taking the ARB.  It sounds like someone else is prescribing clonidine. -     CBC with Differential/Platelet; Future -     Basic metabolic panel; Future -     Thyroid Panel With TSH; Future -     Thyroid Panel With TSH -     CBC with Differential/Platelet -     Basic metabolic panel  Acquired  hypothyroidism- His TSH is mildly elevated.  His thyroid supplement is being prescribed by someone else.   I have discontinued Jacob Kardell. [name]'s nitroGLYCERIN, ketoconazole, and candesartan. I am also having him maintain his clomiPHENE, UNABLE TO FIND, pantoprazole, levocetirizine, and DULoxetine.  No orders of the defined types were placed in this encounter.    Follow-up: Return in about 6 months (around 08/02/2020).  Scarlette Calico, MD

## 2020-02-17 DIAGNOSIS — L700 Acne vulgaris: Secondary | ICD-10-CM | POA: Diagnosis not present

## 2020-02-17 DIAGNOSIS — Z79899 Other long term (current) drug therapy: Secondary | ICD-10-CM | POA: Diagnosis not present

## 2020-03-06 DIAGNOSIS — M25511 Pain in right shoulder: Secondary | ICD-10-CM | POA: Diagnosis not present

## 2020-03-06 DIAGNOSIS — G8929 Other chronic pain: Secondary | ICD-10-CM | POA: Diagnosis not present

## 2020-03-06 DIAGNOSIS — M75111 Incomplete rotator cuff tear or rupture of right shoulder, not specified as traumatic: Secondary | ICD-10-CM | POA: Diagnosis not present

## 2020-03-15 DIAGNOSIS — M19011 Primary osteoarthritis, right shoulder: Secondary | ICD-10-CM | POA: Diagnosis not present

## 2020-03-15 DIAGNOSIS — M7541 Impingement syndrome of right shoulder: Secondary | ICD-10-CM | POA: Diagnosis not present

## 2020-03-20 DIAGNOSIS — L700 Acne vulgaris: Secondary | ICD-10-CM | POA: Diagnosis not present

## 2020-03-20 DIAGNOSIS — L739 Follicular disorder, unspecified: Secondary | ICD-10-CM | POA: Diagnosis not present

## 2020-03-20 DIAGNOSIS — Z79899 Other long term (current) drug therapy: Secondary | ICD-10-CM | POA: Diagnosis not present

## 2020-03-27 ENCOUNTER — Encounter: Payer: Self-pay | Admitting: Internal Medicine

## 2020-03-31 DIAGNOSIS — X58XXXA Exposure to other specified factors, initial encounter: Secondary | ICD-10-CM | POA: Diagnosis not present

## 2020-03-31 DIAGNOSIS — G8918 Other acute postprocedural pain: Secondary | ICD-10-CM | POA: Diagnosis not present

## 2020-03-31 DIAGNOSIS — M19011 Primary osteoarthritis, right shoulder: Secondary | ICD-10-CM | POA: Diagnosis not present

## 2020-03-31 DIAGNOSIS — M25511 Pain in right shoulder: Secondary | ICD-10-CM | POA: Diagnosis not present

## 2020-03-31 DIAGNOSIS — M7541 Impingement syndrome of right shoulder: Secondary | ICD-10-CM | POA: Diagnosis not present

## 2020-03-31 DIAGNOSIS — I890 Lymphedema, not elsewhere classified: Secondary | ICD-10-CM | POA: Diagnosis not present

## 2020-03-31 DIAGNOSIS — S43431A Superior glenoid labrum lesion of right shoulder, initial encounter: Secondary | ICD-10-CM | POA: Diagnosis not present

## 2020-03-31 DIAGNOSIS — Y999 Unspecified external cause status: Secondary | ICD-10-CM | POA: Diagnosis not present

## 2020-04-10 DIAGNOSIS — M25611 Stiffness of right shoulder, not elsewhere classified: Secondary | ICD-10-CM | POA: Diagnosis not present

## 2020-04-10 DIAGNOSIS — M25511 Pain in right shoulder: Secondary | ICD-10-CM | POA: Diagnosis not present

## 2020-04-14 DIAGNOSIS — M25611 Stiffness of right shoulder, not elsewhere classified: Secondary | ICD-10-CM | POA: Diagnosis not present

## 2020-04-14 DIAGNOSIS — M25511 Pain in right shoulder: Secondary | ICD-10-CM | POA: Diagnosis not present

## 2020-04-18 DIAGNOSIS — L308 Other specified dermatitis: Secondary | ICD-10-CM | POA: Diagnosis not present

## 2020-04-18 DIAGNOSIS — L700 Acne vulgaris: Secondary | ICD-10-CM | POA: Diagnosis not present

## 2020-04-18 DIAGNOSIS — Z79899 Other long term (current) drug therapy: Secondary | ICD-10-CM | POA: Diagnosis not present

## 2020-04-19 DIAGNOSIS — M25511 Pain in right shoulder: Secondary | ICD-10-CM | POA: Diagnosis not present

## 2020-04-19 DIAGNOSIS — M25611 Stiffness of right shoulder, not elsewhere classified: Secondary | ICD-10-CM | POA: Diagnosis not present

## 2020-04-20 DIAGNOSIS — E291 Testicular hypofunction: Secondary | ICD-10-CM | POA: Diagnosis not present

## 2020-04-20 DIAGNOSIS — E60 Dietary zinc deficiency: Secondary | ICD-10-CM | POA: Diagnosis not present

## 2020-04-20 DIAGNOSIS — E612 Magnesium deficiency: Secondary | ICD-10-CM | POA: Diagnosis not present

## 2020-04-20 DIAGNOSIS — E039 Hypothyroidism, unspecified: Secondary | ICD-10-CM | POA: Diagnosis not present

## 2020-04-26 DIAGNOSIS — M25511 Pain in right shoulder: Secondary | ICD-10-CM | POA: Diagnosis not present

## 2020-04-26 DIAGNOSIS — M25611 Stiffness of right shoulder, not elsewhere classified: Secondary | ICD-10-CM | POA: Diagnosis not present

## 2020-04-28 DIAGNOSIS — M25511 Pain in right shoulder: Secondary | ICD-10-CM | POA: Diagnosis not present

## 2020-04-28 DIAGNOSIS — M25611 Stiffness of right shoulder, not elsewhere classified: Secondary | ICD-10-CM | POA: Diagnosis not present

## 2020-04-29 ENCOUNTER — Other Ambulatory Visit: Payer: Self-pay | Admitting: Internal Medicine

## 2020-05-01 DIAGNOSIS — M25611 Stiffness of right shoulder, not elsewhere classified: Secondary | ICD-10-CM | POA: Diagnosis not present

## 2020-05-01 DIAGNOSIS — M25511 Pain in right shoulder: Secondary | ICD-10-CM | POA: Diagnosis not present

## 2020-05-03 DIAGNOSIS — M25611 Stiffness of right shoulder, not elsewhere classified: Secondary | ICD-10-CM | POA: Diagnosis not present

## 2020-05-03 DIAGNOSIS — M25511 Pain in right shoulder: Secondary | ICD-10-CM | POA: Diagnosis not present

## 2020-05-04 DIAGNOSIS — R790 Abnormal level of blood mineral: Secondary | ICD-10-CM | POA: Diagnosis not present

## 2020-05-04 DIAGNOSIS — E039 Hypothyroidism, unspecified: Secondary | ICD-10-CM | POA: Diagnosis not present

## 2020-05-04 DIAGNOSIS — R5383 Other fatigue: Secondary | ICD-10-CM | POA: Diagnosis not present

## 2020-05-04 DIAGNOSIS — E291 Testicular hypofunction: Secondary | ICD-10-CM | POA: Diagnosis not present

## 2020-05-09 DIAGNOSIS — J0100 Acute maxillary sinusitis, unspecified: Secondary | ICD-10-CM | POA: Diagnosis not present

## 2020-05-16 NOTE — Progress Notes (Signed)
I, [name], LAT, ATC, am serving as scribe for [name].  Jacob [name] is a 40 y.o. male who presents to [place] at [facility] today for groin pain.  He was last seen by Dr. Georgina Snell on 12/07/19 for R elbow and shoulder pain. Today, pt reports R groin/anterior hip pain x 6-9 months. He locates his pain to deep within the anterior aspect of the hip. Several months ago, after playing tennis pain would increase and cause antalgic gait. Patient notes that he has been receiving physical therapy primarily for his shoulder after having a shoulder surgery.  He has addressed this issue with physical therapy 6 weeks ago.  He has been doing some physical therapy exercises as directed by PT for his hip issue with little benefit.  Radiating pain: no Swelling: no Aggravating factors: any quick movements Treatments tried: Exercises as directed by PT.   Pertinent review of systems: No fevers or chills  Relevant historical information: Patient is an avid Firefighter   Exam:  BP (!) 144/91 (BP Location: Right Arm, Patient Position: Sitting, Cuff Size: Normal)   Pulse 74   Ht 5\' 9"  (1.753 m)   Wt 187 lb 3.2 oz (84.9 kg)   SpO2 99%   BMI 27.64 kg/m  General: Well Developed, well nourished, and in no acute distress.   MSK: Right hip normal-appearing Nontender. Normal motion pain with flexion and internal rotation. Pain with resisted hip flexion mildly. Normal strength.    Lab and Radiology Results  X-ray images right hip obtained today personally and independently interpreted Avulsion fragment at superior acetabulum concerning for labrum tear.  No significant osteoarthritis changes. Await formal radiology review    Assessment and Plan: 40 y.o. male with persistent right anterior hip pain.  This is concerning for labrum tear or femoral acetabular impingement.  He is failing conservative management.  Although this is the first visit with me for this  issue he has been receiving physical therapy treatment for this problem already with little benefit.  Plan to proceed to MRI arthrogram to further characterize cause of pain.  Chief diagnosis at this point is a labrum tear.  Alternate diagnosis is femoral neck stress fracture, AVN, or femoral acetabular impingement.  If he is very lucky this will be hip flexor tendinopathy however I am not optimistic about this.  Recheck after MRI.  Additionally at home exercises were reviewed and taught again today by ATC in clinic.  Primarily focused on hip flexor stretching and strengthening.   PDMP not reviewed this encounter. Orders Placed This Encounter  Procedures  . DG HIP UNILAT W OR W/O PELVIS 2-3 VIEWS RIGHT    Standing Status:   Future    Number of Occurrences:   1    Standing Expiration Date:   05/17/2021    Order Specific Question:   Reason for Exam (SYMPTOM  OR DIAGNOSIS REQUIRED)    Answer:   right hip pain    Order Specific Question:   Preferred imaging location?    Answer:   [facility]  . MR HIP RIGHT W CONTRAST    MRI arthrogram only. No IV contrast. Schedule with Dr 1 hour prior to MRI for injection    Standing Status:   Future    Standing Expiration Date:   05/17/2021    Scheduling Instructions:     MRI arthrogram only. No IV contrast. Schedule with Dr 1 hour prior to MRI for injection  Order Specific Question:   If indicated for the ordered procedure, I authorize the administration of contrast media per Radiology protocol    Answer:   Yes    Order Specific Question:   What is the patient's sedation requirement?    Answer:   No Sedation    Order Specific Question:   Does the patient have a pacemaker or implanted devices?    Answer:   No    Order Specific Question:   Preferred imaging location?    Answer:   Product/process development scientist (table limit-350lbs)   No orders of the defined types were placed in this encounter.    Discussed warning signs or symptoms. Please see  discharge instructions. Patient expresses understanding.   The above documentation has been reviewed and is accurate and complete [name], M.D.

## 2020-05-17 ENCOUNTER — Ambulatory Visit (INDEPENDENT_AMBULATORY_CARE_PROVIDER_SITE_OTHER): Payer: BC Managed Care – PPO | Admitting: Family Medicine

## 2020-05-17 ENCOUNTER — Other Ambulatory Visit: Payer: Self-pay

## 2020-05-17 ENCOUNTER — Ambulatory Visit (INDEPENDENT_AMBULATORY_CARE_PROVIDER_SITE_OTHER): Payer: BC Managed Care – PPO

## 2020-05-17 VITALS — BP 144/91 | HR 74 | Ht 69.0 in | Wt 187.2 lb

## 2020-05-17 DIAGNOSIS — M25551 Pain in right hip: Secondary | ICD-10-CM

## 2020-05-17 DIAGNOSIS — M25611 Stiffness of right shoulder, not elsewhere classified: Secondary | ICD-10-CM | POA: Diagnosis not present

## 2020-05-17 DIAGNOSIS — M25511 Pain in right shoulder: Secondary | ICD-10-CM | POA: Diagnosis not present

## 2020-05-17 NOTE — Patient Instructions (Addendum)
Thank you for coming in today.  Please get an Xray today before you leave  Plan for MRI.   Please complete the exercises that the athletic trainer went over with you: View at [email or web address] using code: [id number]

## 2020-05-18 DIAGNOSIS — L700 Acne vulgaris: Secondary | ICD-10-CM | POA: Diagnosis not present

## 2020-05-18 DIAGNOSIS — Z79899 Other long term (current) drug therapy: Secondary | ICD-10-CM | POA: Diagnosis not present

## 2020-05-18 DIAGNOSIS — L308 Other specified dermatitis: Secondary | ICD-10-CM | POA: Diagnosis not present

## 2020-05-18 NOTE — Progress Notes (Signed)
Right hip looks normal to radiology

## 2020-05-19 DIAGNOSIS — M25611 Stiffness of right shoulder, not elsewhere classified: Secondary | ICD-10-CM | POA: Diagnosis not present

## 2020-05-19 DIAGNOSIS — M25511 Pain in right shoulder: Secondary | ICD-10-CM | POA: Diagnosis not present

## 2020-05-22 ENCOUNTER — Ambulatory Visit: Payer: BC Managed Care – PPO | Admitting: Family Medicine

## 2020-05-24 ENCOUNTER — Ambulatory Visit: Payer: BC Managed Care – PPO | Admitting: Family Medicine

## 2020-05-29 ENCOUNTER — Ambulatory Visit (INDEPENDENT_AMBULATORY_CARE_PROVIDER_SITE_OTHER): Payer: BC Managed Care – PPO | Admitting: Sports Medicine

## 2020-05-29 ENCOUNTER — Ambulatory Visit (INDEPENDENT_AMBULATORY_CARE_PROVIDER_SITE_OTHER): Payer: BC Managed Care – PPO

## 2020-05-29 ENCOUNTER — Other Ambulatory Visit: Payer: Self-pay

## 2020-05-29 ENCOUNTER — Encounter: Payer: Self-pay | Admitting: Family Medicine

## 2020-05-29 DIAGNOSIS — M25551 Pain in right hip: Secondary | ICD-10-CM | POA: Diagnosis not present

## 2020-05-29 DIAGNOSIS — S79911A Unspecified injury of right hip, initial encounter: Secondary | ICD-10-CM | POA: Diagnosis not present

## 2020-05-29 DIAGNOSIS — M25451 Effusion, right hip: Secondary | ICD-10-CM | POA: Diagnosis not present

## 2020-05-29 DIAGNOSIS — M533 Sacrococcygeal disorders, not elsewhere classified: Secondary | ICD-10-CM | POA: Diagnosis not present

## 2020-05-29 DIAGNOSIS — G8929 Other chronic pain: Secondary | ICD-10-CM

## 2020-05-29 DIAGNOSIS — M1611 Unilateral primary osteoarthritis, right hip: Secondary | ICD-10-CM | POA: Diagnosis not present

## 2020-05-29 MED ORDER — GADOBUTROL 1 MMOL/ML IV SOLN
1.0000 mL | Freq: Once | INTRAVENOUS | Status: AC | PRN
  Administered 2020-05-29: 1 mL via INTRAVENOUS

## 2020-05-29 NOTE — Progress Notes (Signed)
    Procedures performed today:    Procedure: Real-time Ultrasound Guided gadolinium contrast injection of right hip joint Device: Samsung HS60  Verbal informed consent obtained.  Time-out conducted.  Noted no overlying erythema, induration, or other signs of local infection.  Skin prepped in a sterile fashion.  Local anesthesia: Topical Ethyl chloride.  With sterile technique and under real time ultrasound guidance: Noted normal-appearing hip joint capsule, 22-gauge spinal needle advanced to the femoral head/neck junction, I then injected 1 cc kenalog 40, 2 cc lidocaine, 2 cc bupivacaine, syringe again switched and 0.1 cc gadolinium injected, syringe switched again in 10 cc sterile saline used to fully distend the joint. Joint visualized and capsule seen distending confirming intra-articular placement of contrast material and medication. Completed without difficulty  Advised to call if fevers/chills, erythema, induration, drainage, or persistent bleeding.  Images permanently stored in PACS Impression: Technically successful ultrasound guided gadolinium contrast injection for MR arthrography.  Please see separate MR arthrogram report.  Independent interpretation of notes and tests performed by another provider:   None.  Brief History, Exam, Impression, and Recommendations:    Chronic right hip pain Patient has been seeing Dr. Georgina Snell, question hip labral injury, arthrogram performed today. Further management per primary treating provider.    ___________________________________________ Gwen Her. Dianah Field, M.D., ABFM., CAQSM. Primary Care and [place] Instructor of [employer] [facility] [employer] [facility]

## 2020-05-29 NOTE — Assessment & Plan Note (Signed)
Patient has been seeing Dr. Georgina Snell, question hip labral injury, arthrogram performed today. Further management per primary treating provider.

## 2020-05-30 NOTE — Progress Notes (Signed)
MRI of the hip shows stress injury to the pubic ramus.  This is not a stress fracture but could become 1.  Treatment for this is a bit of rest and less loading of the hip with running and tennis for now.  Additionally you do have a labrum tear that could be causing the pain as well.  Recommend return to clinic to go over the results in full detail.

## 2020-06-01 ENCOUNTER — Other Ambulatory Visit: Payer: Self-pay

## 2020-06-01 ENCOUNTER — Ambulatory Visit (INDEPENDENT_AMBULATORY_CARE_PROVIDER_SITE_OTHER): Payer: BC Managed Care – PPO | Admitting: Family Medicine

## 2020-06-01 ENCOUNTER — Encounter: Payer: Self-pay | Admitting: Family Medicine

## 2020-06-01 VITALS — BP 138/88 | HR 72 | Ht 69.0 in | Wt 189.8 lb

## 2020-06-01 DIAGNOSIS — M25551 Pain in right hip: Secondary | ICD-10-CM | POA: Diagnosis not present

## 2020-06-01 NOTE — Progress Notes (Signed)
I, Jacob [name], LAT, ATC, am serving as scribe for [name].  Jacob [name] is a 40 y.o. male who presents to [place] at [facility] today for f/u R ant hip/groin pain and MRI review. Pt was last seen by Dr. Georgina [name] on 05/17/20 and was advised to proceed to MRI arthrogram to further characterize cause of pain. Today, pt reports that his R hip is feeling about the same possibly slightly better.  Dx imaging: 05/29/20 R hip MRI  05/17/20 R hip/pelvis XR   Pertinent review of systems: No fevers or chills  Relevant historical information: Producer, television/film/video.   Exam:  BP 138/88 (BP Location: Right Arm, Patient Position: Sitting, Cuff Size: Normal)   Pulse 72   Ht 5\' 9"  (1.753 m)   Wt 189 lb 12.8 oz (86.1 kg)   SpO2 97%   BMI 28.03 kg/m  General: Well Developed, well nourished, and in no acute distress.   MSK: Right hip normal-appearing normal motion.    Lab and Radiology Results No results found for this or any previous visit (from the past 72 hour(s)). MR HIP RIGHT W CONTRAST  Result Date: 05/30/2020 CLINICAL DATA:  Chronic right hip pain. EXAM: MRI OF THE RIGHT HIP WITH CONTRAST TECHNIQUE: Multiplanar, multisequence MR imaging was performed following the administration of intravenous contrast. CONTRAST:  59mL GADAVIST GADOBUTROL 1 MMOL/ML IV SOLN COMPARISON:  Right hip x-rays dated May 17, 2020. FINDINGS: Bones: Focal marrow edema within the right parasymphyseal superior pubic ramus. No acute fracture or dislocation. No avascular necrosis. Normal no focal bone lesion. The visualized sacroiliac joints and symphysis pubis appear normal. Articular cartilage and labrum Articular cartilage: No focal chondral defect or subchondral signal abnormality identified. Labrum: Fraying and degeneration of the right anterior superior labrum (series 6, images 6-8). No paralabral abnormality. Joint or bursal effusion Joint effusion: Right hip joint is distended with  intra-articular contrast. No left hip joint effusion. Bursae: No focal periarticular fluid collection. Muscles and tendons Muscles and tendons: The visualized gluteus, hamstring and iliopsoas tendons appear normal. No muscle edema or atrophy. Other findings Miscellaneous: The visualized internal pelvic contents appear unremarkable. IMPRESSION: 1. Stress injury of the right parasymphyseal superior pubic ramus. 2. Fraying and degeneration of the right anterior superior labrum. Electronically Signed   By: Titus Dubin M.D.   On: 05/30/2020 13:39   [place] LIMITED JOINT SPACE STRUCTURES LOW RIGHT  Result Date: 05/29/2020 Procedure: Real-time Ultrasound Guided gadolinium contrast injection of right hip joint Device: Samsung [id number] Verbal informed consent obtained. Time-out conducted. Noted no overlying erythema, induration, or other signs of local infection. Skin prepped in a sterile fashion. Local anesthesia: Topical Ethyl chloride. With sterile technique and under real time ultrasound guidance: Noted normal-appearing hip joint capsule, 22-gauge spinal needle advanced to the femoral head/neck junction, I then injected 1 cc kenalog 40, 2 cc lidocaine, 2 cc bupivacaine, syringe again switched and 0.1 cc gadolinium injected, syringe switched again in 10 cc sterile saline used to fully distend the joint. Joint visualized and capsule seen distending confirming intra-articular placement of contrast material and medication. Completed without difficulty Advised to call if fevers/chills, erythema, induration, drainage, or persistent bleeding. Images permanently stored in PACS Impression: Technically successful ultrasound guided gadolinium contrast injection for MR arthrography.  Please see separate MR arthrogram report.   I, [name], personally (independently) visualized and performed the interpretation of the MRI images attached in this note.    Assessment and Plan: 40 y.o. male with  right groin pain.  Surprisingly  MRI showed stress reaction of the pubic ramus near the pubic symphysis.  He does have a labrum degeneration that could be source of pain however the stress reaction is more likely to be the source of his pain.  Fortunate this should be treatable with rest and then rehab.  Recommend abstaining from tennis and weightlifting exercises for 4 to 6 weeks followed by graduated return to play protocol. Recheck as needed.   Discussed warning signs or symptoms. Please see discharge instructions. Patient expresses understanding.   The above documentation has been reviewed and is accurate and complete [name], M.D.   Total encounter time 20 minutes including face-to-face time with the patient and, reviewing past medical record, and charting on the date of service.   Reviewed MRI and discuss treatment plan

## 2020-06-01 NOTE — Patient Instructions (Signed)
Thank you for coming in today.  This is a stress reaction. Take it easy. Plan for no heavy weight bearing exercise or exercise that hurts for 4-6 weeks followed by a 4-6 week return to play progression (10% per week).    Look up sports hernia and look up stress fracture of the pubic bone.

## 2020-06-20 DIAGNOSIS — Z79899 Other long term (current) drug therapy: Secondary | ICD-10-CM | POA: Diagnosis not present

## 2020-06-20 DIAGNOSIS — L700 Acne vulgaris: Secondary | ICD-10-CM | POA: Diagnosis not present

## 2020-06-21 ENCOUNTER — Other Ambulatory Visit: Payer: Self-pay | Admitting: Internal Medicine

## 2020-06-21 DIAGNOSIS — F418 Other specified anxiety disorders: Secondary | ICD-10-CM

## 2020-07-11 ENCOUNTER — Ambulatory Visit: Payer: BC Managed Care – PPO | Admitting: Family Medicine

## 2020-07-21 DIAGNOSIS — L700 Acne vulgaris: Secondary | ICD-10-CM | POA: Diagnosis not present

## 2020-07-21 DIAGNOSIS — Z79899 Other long term (current) drug therapy: Secondary | ICD-10-CM | POA: Diagnosis not present

## 2020-08-25 DIAGNOSIS — E559 Vitamin D deficiency, unspecified: Secondary | ICD-10-CM | POA: Diagnosis not present

## 2020-08-25 DIAGNOSIS — R790 Abnormal level of blood mineral: Secondary | ICD-10-CM | POA: Diagnosis not present

## 2020-08-25 DIAGNOSIS — E291 Testicular hypofunction: Secondary | ICD-10-CM | POA: Diagnosis not present

## 2020-08-25 DIAGNOSIS — R5383 Other fatigue: Secondary | ICD-10-CM | POA: Diagnosis not present

## 2020-08-25 DIAGNOSIS — R7989 Other specified abnormal findings of blood chemistry: Secondary | ICD-10-CM | POA: Diagnosis not present

## 2020-08-25 DIAGNOSIS — E039 Hypothyroidism, unspecified: Secondary | ICD-10-CM | POA: Diagnosis not present

## 2020-09-15 DIAGNOSIS — E291 Testicular hypofunction: Secondary | ICD-10-CM | POA: Diagnosis not present

## 2020-09-15 DIAGNOSIS — E612 Magnesium deficiency: Secondary | ICD-10-CM | POA: Diagnosis not present

## 2020-09-15 DIAGNOSIS — E60 Dietary zinc deficiency: Secondary | ICD-10-CM | POA: Diagnosis not present

## 2020-09-15 DIAGNOSIS — E039 Hypothyroidism, unspecified: Secondary | ICD-10-CM | POA: Diagnosis not present

## 2020-09-19 DIAGNOSIS — J301 Allergic rhinitis due to pollen: Secondary | ICD-10-CM | POA: Diagnosis not present

## 2020-09-20 DIAGNOSIS — R790 Abnormal level of blood mineral: Secondary | ICD-10-CM | POA: Diagnosis not present

## 2020-09-22 ENCOUNTER — Other Ambulatory Visit: Payer: Self-pay | Admitting: Physician Assistant

## 2020-09-28 ENCOUNTER — Ambulatory Visit (INDEPENDENT_AMBULATORY_CARE_PROVIDER_SITE_OTHER): Payer: BC Managed Care – PPO | Admitting: Sports Medicine

## 2020-09-28 ENCOUNTER — Encounter: Payer: Self-pay | Admitting: Sports Medicine

## 2020-09-28 ENCOUNTER — Ambulatory Visit: Payer: Self-pay

## 2020-09-28 ENCOUNTER — Other Ambulatory Visit: Payer: Self-pay

## 2020-09-28 ENCOUNTER — Ambulatory Visit (INDEPENDENT_AMBULATORY_CARE_PROVIDER_SITE_OTHER): Payer: BC Managed Care – PPO

## 2020-09-28 VITALS — BP 120/82 | HR 75 | Ht 69.0 in | Wt 193.0 lb

## 2020-09-28 DIAGNOSIS — M25571 Pain in right ankle and joints of right foot: Secondary | ICD-10-CM | POA: Diagnosis not present

## 2020-09-28 DIAGNOSIS — R790 Abnormal level of blood mineral: Secondary | ICD-10-CM | POA: Diagnosis not present

## 2020-09-28 DIAGNOSIS — S93401A Sprain of unspecified ligament of right ankle, initial encounter: Secondary | ICD-10-CM | POA: Diagnosis not present

## 2020-09-28 DIAGNOSIS — M7989 Other specified soft tissue disorders: Secondary | ICD-10-CM | POA: Diagnosis not present

## 2020-09-28 MED ORDER — MELOXICAM 15 MG PO TABS: 15.0000 mg | ORAL_TABLET | Freq: Every day | ORAL | 0 refills | Status: DC

## 2020-09-28 NOTE — Patient Instructions (Addendum)
Good to see you  Melxicam sent in take daily for 3 weeks with food  Ankle exercises given try to do 3X a week  Get ankle brace to wear for stability  See me again if needed

## 2020-09-28 NOTE — Progress Notes (Signed)
Jacob [name] D.[place] Phone: [phone]   Assessment and Plan:    1. Acute right ankle pain 2. Inversion sprain of right ankle, initial encounter - Acute, initial sports medicine visit - internal ankle inversion sprain of ATFL, CFL based on physical exam, HPI - Start meloxicam 15 mg daily x2 weeks.  May use remaining medication as needed for pain control - May use Tylenol for breakthrough pain - Use lace up ankle brace when ambulatory for next week.  May slowly progress with activities as tolerated.  Recommend waiting 2 weeks before reintroducing tennis activities.  Use lace up ankle brace when playing tennis for next 1 to 2 months. -Start HEP for ankle sprain -X-ray obtained in clinic.  My interpretation: No acute fracture, joint space maintained, no syndesmotic widening - [place] LIMITED JOINT SPACE STRUCTURES LOW RIGHT(NO LINKED CHARGES); Future - DG Ankle Complete Right; Future    All pertinent previous records reviewed prior to and during visit.   Follow Up: Follow-up as needed if no improvement or worsening of symptoms.  Could consider formal PT versus advanced imaging versus fitted brace.   Subjective:   I, Jacob [name], am serving as a scribe for Dr. Glennon [name]  Chief Complaint: Right ankle pain   HPI: 40 year old male presenting with R ankle pain and swelling   09/28/20 Patient states was playing tennis yesterday with an automatic ball shooter when he stepped on a ball and rolled his R ankle ankle. Has to walk with the ankle/foot stiff. Describes pain as sharp and aching.   Location: Lateral R ankle  Radiates: up the R calf  Swelling: yes  Mechanical symptoms: no Aggravates: flexion extension  Treatments tried: alternating ibuprofen and tylenol, ice, rest     Relevant Historical Information: none pertinent   Additional pertinent review of systems negative.   Current Outpatient  Medications:    clomiPHENE (CLOMID) 50 MG tablet, Take 50 mg by mouth. 50 mg M,W,F and 25 mg T,Th,S,Su, Disp: , Rfl:    DULoxetine (CYMBALTA) 30 MG capsule, TAKE 1 CAPSULE (30 MG TOTAL) BY MOUTH DAILY. MUST KEEP APPT FOR FUTURE REFILLS, Disp: 90 capsule, Rfl: 1   levocetirizine (XYZAL) 5 MG tablet, TAKE 2 TABLETS (10 MG TOTAL) BY MOUTH AT BEDTIME., Disp: 180 tablet, Rfl: 1   meloxicam (MOBIC) 15 MG tablet, Take 1 tablet (15 mg total) by mouth daily., Disp: 30 tablet, Rfl: 0   pantoprazole (PROTONIX) 40 MG tablet, TAKE ONE TABLET BY MOUTH 30 TO 60 MINUTES BEFORE BREAKFAST AND DINNER, Disp: 180 tablet, Rfl: 1   UNABLE TO FIND, Three grains nature thyroid, Disp: , Rfl:    Objective:     Vitals:   09/28/20 1322  BP: 120/82  Pulse: 75  SpO2: 98%  Weight: 193 lb (87.5 kg)  Height: '5\' 9"'$  (1.753 m)      Body mass index is 28.5 kg/m.    Physical Exam:    Gen: Appears well, nad, nontoxic and pleasant Psych: Alert and oriented, appropriate mood and affect Neuro: sensation intact, strength is 5/5 with df/pf/inv/ev, muscle tone wnl Skin: no susupicious lesions or rashes  Right ankle: no deformity, moderate lateral swelling or effusion TTP lateral malleolus, ATFL, CFL NTTP over fibular head, medial mal, achilles, navicular, base of 5th,  deltoid, calcaneous or midfoot ROM DF 20, PF 25, inv/ev limited by pain Negative ant drawer, talar tilt, rotation test, squeeze test. Neg Fecher pain  with resisted inversion or eversion    Electronically signed by:  Jacob [name] D.[facility] 2:04 PM 09/28/20

## 2020-10-17 ENCOUNTER — Encounter: Payer: Self-pay | Admitting: Physician Assistant

## 2020-10-17 ENCOUNTER — Telehealth: Payer: BC Managed Care – PPO | Admitting: Physician Assistant

## 2020-10-17 DIAGNOSIS — J40 Bronchitis, not specified as acute or chronic: Secondary | ICD-10-CM

## 2020-10-17 DIAGNOSIS — J0140 Acute pansinusitis, unspecified: Secondary | ICD-10-CM | POA: Diagnosis not present

## 2020-10-17 MED ORDER — PREDNISONE 10 MG (21) PO TBPK: ORAL_TABLET | ORAL | 0 refills | Status: DC

## 2020-10-17 MED ORDER — AMOXICILLIN-POT CLAVULANATE 875-125 MG PO TABS: 1.0000 | ORAL_TABLET | Freq: Two times a day (BID) | ORAL | 0 refills | Status: DC

## 2020-10-17 MED ORDER — PROMETHAZINE-DM 6.25-15 MG/5ML PO SYRP: 5.0000 mL | ORAL_SOLUTION | Freq: Four times a day (QID) | ORAL | 0 refills | Status: DC | PRN

## 2020-10-17 MED ORDER — BENZONATATE 100 MG PO CAPS: 100.0000 mg | ORAL_CAPSULE | Freq: Three times a day (TID) | ORAL | 0 refills | Status: DC | PRN

## 2020-10-17 NOTE — Progress Notes (Signed)
Virtual Visit Consent   Jacob [name], you are scheduled for a virtual visit with a [facility] provider today.     Just as with appointments in the office, your consent must be obtained to participate.  Your consent will be active for this visit and any virtual visit you may have with one of our providers in the next 365 days.     If you have a MyChart account, a copy of this consent can be sent to you electronically.  All virtual visits are billed to your insurance company just like a traditional visit in the office.    As this is a virtual visit, video technology does not allow for your provider to perform a traditional examination.  This may limit your provider's ability to fully assess your condition.  If your provider identifies any concerns that need to be evaluated in person or the need to arrange testing (such as labs, EKG, etc.), we will make arrangements to do so.     Although advances in technology are sophisticated, we cannot ensure that it will always work on either your end or our end.  If the connection with a video visit is poor, the visit may have to be switched to a telephone visit.  With either a video or telephone visit, we are not always able to ensure that we have a secure connection.     I need to obtain your verbal consent now.   Are you willing to proceed with your visit today?    MEGAN HAYDUK has provided verbal consent on 10/17/2020 for a virtual visit (video or telephone).   Mar Daring, PA-C   Date: 10/17/2020 9:23 AM   Virtual Visit via Video Note   I, Mar Daring, connected with  Jacob [name]  ([id number], [birth year]) on 10/17/20 at  9:15 AM EDT by a video-enabled telemedicine application and verified that I am speaking with the correct person using two identifiers.  Location: Patient: Virtual Visit Location Patient: Home Provider: Virtual Visit Location Provider: Home Office   I discussed the limitations of evaluation and  management by telemedicine and the availability of in person appointments. The patient expressed understanding and agreed to proceed.    History of Present Illness: Jacob [name] is a 40 y.o. who identifies as a male who was assigned male at birth, and is being seen today for possible bronchitis.  HPI: URI  This is a new problem. The current episode started 1 to 4 weeks ago (3 weeks). The problem has been gradually worsening. There has been no fever. Associated symptoms include congestion, coughing, headaches, rhinorrhea, sinus pain and a sore throat. He has tried NSAIDs (guafensin and sudafed) for the symptoms. The treatment provided no relief.     Problems:  Patient Active Problem List   Diagnosis Date Noted   Chronic right hip pain 05/29/2020   Essential hypertension 12/10/2019   Tinea pedis of both feet 12/03/2019   Biceps tendinitis of right upper extremity 02/05/2019   Lateral epicondylitis of right elbow 05/01/2017   Family history of brain aneurysm 11/05/2016   Renal cyst, left 03/24/2016   Routine general medical examination at a health care facility 11/22/2015   Hypothyroid     Allergies: No Known Allergies Medications:  Current Outpatient Medications:    amoxicillin-clavulanate (AUGMENTIN) 875-125 MG tablet, Take 1 tablet by mouth 2 (two) times daily., Disp: 20 tablet, Rfl: 0   benzonatate (TESSALON) 100 MG capsule, Take 1 capsule (100  mg total) by mouth 3 (three) times daily as needed., Disp: 30 capsule, Rfl: 0   predniSONE (STERAPRED UNI-PAK 21 TAB) 10 MG (21) TBPK tablet, 6 day taper; take as directed on package instructions, Disp: 21 tablet, Rfl: 0   clomiPHENE (CLOMID) 50 MG tablet, Take 50 mg by mouth. 50 mg M,W,F and 25 mg T,Th,S,Su, Disp: , Rfl:    DULoxetine (CYMBALTA) 30 MG capsule, TAKE 1 CAPSULE (30 MG TOTAL) BY MOUTH DAILY. MUST KEEP APPT FOR FUTURE REFILLS, Disp: 90 capsule, Rfl: 1   levocetirizine (XYZAL) 5 MG tablet, TAKE 2 TABLETS (10 MG TOTAL) BY MOUTH  AT BEDTIME., Disp: 180 tablet, Rfl: 1   meloxicam (MOBIC) 15 MG tablet, Take 1 tablet (15 mg total) by mouth daily., Disp: 30 tablet, Rfl: 0   pantoprazole (PROTONIX) 40 MG tablet, TAKE ONE TABLET BY MOUTH 30 TO 60 MINUTES BEFORE BREAKFAST AND DINNER, Disp: 180 tablet, Rfl: 1   UNABLE TO FIND, Three grains nature thyroid, Disp: , Rfl:   Observations/Objective: Patient is well-developed, well-nourished in no acute distress.  Resting comfortably at home.  Head is normocephalic, atraumatic.  No labored breathing.  Speech is clear and coherent with logical content.  Patient is alert and oriented at baseline.    Assessment and Plan: 1. Bronchitis - amoxicillin-clavulanate (AUGMENTIN) 875-125 MG tablet; Take 1 tablet by mouth 2 (two) times daily.  Dispense: 20 tablet; Refill: 0 - benzonatate (TESSALON) 100 MG capsule; Take 1 capsule (100 mg total) by mouth 3 (three) times daily as needed.  Dispense: 30 capsule; Refill: 0 - predniSONE (STERAPRED UNI-PAK 21 TAB) 10 MG (21) TBPK tablet; 6 day taper; take as directed on package instructions  Dispense: 21 tablet; Refill: 0  2. Acute non-recurrent pansinusitis - amoxicillin-clavulanate (AUGMENTIN) 875-125 MG tablet; Take 1 tablet by mouth 2 (two) times daily.  Dispense: 20 tablet; Refill: 0 - benzonatate (TESSALON) 100 MG capsule; Take 1 capsule (100 mg total) by mouth 3 (three) times daily as needed.  Dispense: 30 capsule; Refill: 0  - Worsening symptoms that have not responded to OTC medications.  - Will give augmentin and prednisone - Continue allergy medications.  - Stay well hydrated and get plenty of rest.  - Call or seek in person evaluation if no symptom improvement or if symptoms worsen.  Follow Up Instructions: I discussed the assessment and treatment plan with the patient. The patient was provided an opportunity to ask questions and all were answered. The patient agreed with the plan and demonstrated an understanding of the  instructions.  A copy of instructions were sent to the patient via MyChart unless otherwise noted below.   The patient was advised to call back or seek an in-person evaluation if the symptoms worsen or if the condition fails to improve as anticipated.  Time:  I spent 13 minutes with the patient via telehealth technology discussing the above problems/concerns.    Mar Daring, PA-C

## 2020-10-17 NOTE — Patient Instructions (Signed)
Jacob [name], thank you for joining Mar Daring, PA-C for today's virtual visit.  While this provider is not your primary care provider (PCP), if your PCP is located in our provider database this encounter information will be shared with them immediately following your visit.  Consent: (Patient) Jacob [name] provided verbal consent for this virtual visit at the beginning of the encounter.  Current Medications:  Current Outpatient Medications:    amoxicillin-clavulanate (AUGMENTIN) 875-125 MG tablet, Take 1 tablet by mouth 2 (two) times daily., Disp: 20 tablet, Rfl: 0   benzonatate (TESSALON) 100 MG capsule, Take 1 capsule (100 mg total) by mouth 3 (three) times daily as needed., Disp: 30 capsule, Rfl: 0   predniSONE (STERAPRED UNI-PAK 21 TAB) 10 MG (21) TBPK tablet, 6 day taper; take as directed on package instructions, Disp: 21 tablet, Rfl: 0   clomiPHENE (CLOMID) 50 MG tablet, Take 50 mg by mouth. 50 mg M,W,F and 25 mg T,Th,S,Su, Disp: , Rfl:    DULoxetine (CYMBALTA) 30 MG capsule, TAKE 1 CAPSULE (30 MG TOTAL) BY MOUTH DAILY. MUST KEEP APPT FOR FUTURE REFILLS, Disp: 90 capsule, Rfl: 1   levocetirizine (XYZAL) 5 MG tablet, TAKE 2 TABLETS (10 MG TOTAL) BY MOUTH AT BEDTIME., Disp: 180 tablet, Rfl: 1   meloxicam (MOBIC) 15 MG tablet, Take 1 tablet (15 mg total) by mouth daily., Disp: 30 tablet, Rfl: 0   pantoprazole (PROTONIX) 40 MG tablet, TAKE ONE TABLET BY MOUTH 30 TO 60 MINUTES BEFORE BREAKFAST AND DINNER, Disp: 180 tablet, Rfl: 1   UNABLE TO FIND, Three grains nature thyroid, Disp: , Rfl:    Medications ordered in this encounter:  Meds ordered this encounter  Medications   amoxicillin-clavulanate (AUGMENTIN) 875-125 MG tablet    Sig: Take 1 tablet by mouth 2 (two) times daily.    Dispense:  20 tablet    Refill:  0    Order Specific Question:   Supervising Provider    Answer:   MILLER, BRIAN [[id number]]   benzonatate (TESSALON) 100 MG capsule    Sig: Take 1 capsule  (100 mg total) by mouth 3 (three) times daily as needed.    Dispense:  30 capsule    Refill:  0    Order Specific Question:   Supervising Provider    Answer:   MILLER, BRIAN [[id number]]   predniSONE (STERAPRED UNI-PAK 21 TAB) 10 MG (21) TBPK tablet    Sig: 6 day taper; take as directed on package instructions    Dispense:  21 tablet    Refill:  0    Order Specific Question:   Supervising Provider    Answer:   Sabra Heck, [place]     *If you need refills on other medications prior to your next appointment, please contact your pharmacy*  Follow-Up: Call back or seek an in-person evaluation if the symptoms worsen or if the condition fails to improve as anticipated.  Other Instructions Sinusitis, Adult Sinusitis is soreness and swelling (inflammation) of your sinuses. Sinuses are hollow spaces in the bones around your face. They are located: Around your eyes. In the middle of your forehead. Behind your nose. In your cheekbones. Your sinuses and nasal passages are lined with a fluid called mucus. Mucus drains out of your sinuses. Swelling can trap mucus in your sinuses. This lets germs (bacteria, virus, or fungus) grow, which leads to infection. Most of the time, this condition is caused by a virus. What are the causes? This condition is caused by:  Allergies. Asthma. Germs. Things that block your nose or sinuses. Growths in the nose (nasal polyps). Chemicals or irritants in the air. Fungus (rare). What increases the risk? You are more likely to develop this condition if: You have a weak body defense system (immune system). You do a lot of swimming or diving. You use nasal sprays too much. You smoke. What are the signs or symptoms? The main symptoms of this condition are pain and a feeling of pressure around the sinuses. Other symptoms include: Stuffy nose (congestion). Runny nose (drainage). Swelling and warmth in the sinuses. Headache. Toothache. A cough that may get worse at  night. Mucus that collects in the throat or the back of the nose (postnasal drip). Being unable to smell and taste. Being very tired (fatigue). A fever. Sore throat. Bad breath. How is this diagnosed? This condition is diagnosed based on: Your symptoms. Your medical history. A physical exam. Tests to find out if your condition is short-term (acute) or long-term (chronic). Your doctor may: Check your nose for growths (polyps). Check your sinuses using a tool that has a light (endoscope). Check for allergies or germs. Do imaging tests, such as an MRI or CT scan. How is this treated? Treatment for this condition depends on the cause and whether it is short-term or long-term. If caused by a virus, your symptoms should go away on their own within 10 days. You may be given medicines to relieve symptoms. They include: Medicines that shrink swollen tissue in the nose. Medicines that treat allergies (antihistamines). A spray that treats swelling of the nostrils.  Rinses that help get rid of thick mucus in your nose (nasal saline washes). If caused by bacteria, your doctor may wait to see if you will get better without treatment. You may be given antibiotic medicine if you have: A very bad infection. A weak body defense system. If caused by growths in the nose, you may need to have surgery. Follow these instructions at home: Medicines Take, use, or apply over-the-counter and prescription medicines only as told by your doctor. These may include nasal sprays. If you were prescribed an antibiotic medicine, take it as told by your doctor. Do not stop taking the antibiotic even if you start to feel better. Hydrate and humidify  Drink enough water to keep your pee (urine) pale yellow. Use a cool mist humidifier to keep the humidity level in your home above 50%. Breathe in steam for 10-15 minutes, 3-4 times a day, or as told by your doctor. You can do this in the bathroom while a hot shower is  running. Try not to spend time in cool or dry air. Rest Rest as much as you can. Sleep with your head raised (elevated). Make sure you get enough sleep each night. General instructions  Put a warm, moist washcloth on your face 3-4 times a day, or as often as told by your doctor. This will help with discomfort. Wash your hands often with soap and water. If there is no soap and water, use hand sanitizer. Do not smoke. Avoid being around people who are smoking (secondhand smoke). Keep all follow-up visits as told by your doctor. This is important. Contact a doctor if: You have a fever. Your symptoms get worse. Your symptoms do not get better within 10 days. Get help right away if: You have a very bad headache. You cannot stop throwing up (vomiting). You have very bad pain or swelling around your face or eyes. You have trouble  seeing. You feel confused. Your neck is stiff. You have trouble breathing. Summary Sinusitis is swelling of your sinuses. Sinuses are hollow spaces in the bones around your face. This condition is caused by tissues in your nose that become inflamed or swollen. This traps germs. These can lead to infection. If you were prescribed an antibiotic medicine, take it as told by your doctor. Do not stop taking it even if you start to feel better. Keep all follow-up visits as told by your doctor. This is important. This information is not intended to replace advice given to you by your health care provider. Make sure you discuss any questions you have with your health care provider. Document Revised: 06/09/2017 Document Reviewed: 06/09/2017 Elsevier Patient Education  2022 [employer].   Acute Bronchitis, Adult Acute bronchitis is when air tubes in the lungs (bronchi) suddenly get swollen. The condition can make it hard for you to breathe. In adults, acute bronchitis usually goes away within 2 weeks. A cough caused by bronchitis may last up to 3 weeks. Smoking,  allergies, and asthma can make the condition worse. What are the causes? This condition is caused by: Cold and flu viruses. The most common cause of this condition is the virus that causes the common cold. Bacteria. Substances that irritate the lungs, including: Smoke from cigarettes and other types of tobacco. Dust and pollen. Fumes from chemicals, gases, or burned fuel. Other materials that pollute indoor or outdoor air. Close contact with someone who has acute bronchitis. What increases the risk? The following factors may make you more likely to develop this condition: A weak body's defense system. This is also called the immune system. Any condition that affects your lungs and breathing, such as asthma. What are the signs or symptoms? Symptoms of this condition include: A cough. Coughing up clear, yellow, or green mucus. Wheezing. Having too much mucus in your lungs (chest congestion). Shortness of breath. A fever. Chills. Body aches. A sore throat. How is this treated? Acute bronchitis may go away over time without treatment. Your doctor may recommend: Drinking more fluids. Using a device that gets medicine into your lungs (inhaler). Using a vaporizer or a humidifier. These are machines that add water or moisture to the air. This helps with coughing and poor breathing. Taking a medicine for fever. Taking a medicine that thins mucus and clears congestion. Taking a medicine that prevents or stops coughing. Follow these instructions at home: Activity Get a lot of rest. Return to your normal activities as told by your doctor. Ask your doctor what activities are safe for you. Lifestyle  Drink enough fluid to keep your pee (urine) pale yellow. Do not drink alcohol. Do not use any products that contain nicotine or tobacco, such as cigarettes, e-cigarettes, and chewing tobacco. If you need help quitting, ask your doctor. Be aware that: Your bronchitis will get worse if you  smoke or breathe in other people's smoke (secondhand smoke). Your lungs will heal faster if you quit smoking. General instructions Take over-the-counter and prescription medicines only as told by your doctor. Use an inhaler, cool mist vaporizer, or humidifier as told by your doctor. Rinse your mouth often with salt water. To make salt water, dissolve -1 tsp (3-6 g) of salt in 1 cup (237 mL) of warm water. Take two teaspoons of honey at bedtime. This helps lessen your coughing at night. Keep all follow-up visits as told by your doctor. This is important. How is this prevented? To lower your  risk of getting this condition again: Wash your hands often with soap and water. If you cannot use soap and water, use hand sanitizer. Avoid contact with people who have cold symptoms. Try not to touch your mouth, nose, or eyes with your hands. Make sure to get the flu shot every year. Contact a doctor if: Your symptoms do not get better in 2 weeks. You vomit more than once or twice. You have symptoms of loss of fluid from your body (dehydration). These include: Dark pee. Dry skin or eyes. Increased thirst. Headaches. Confusion. Muscle cramps. Get help right away if: You cough up blood. You have chest pain. You have very bad shortness of breath. You become dehydrated. You faint or keep feeling like you are going to faint. You have a very bad headache. Your fever or chills get worse. These symptoms may be an emergency. Get help right away. Call your local emergency services (911 in the [place] Do not wait to see if the symptoms will go away. Do not drive yourself to the hospital. Summary Acute bronchitis is when air tubes in the lungs (bronchi) suddenly get swollen. In adults, acute bronchitis usually goes away within 2 weeks. Take over-the-counter and prescription medicines only as told by your doctor. Drink enough fluid to keep your pee (urine) pale yellow. Contact a doctor if your symptoms  do not improve after 2 weeks of treatment. Get help right away if you cough up blood, faint, or have chest pain or shortness of breath. This information is not intended to replace advice given to you by your health care provider. Make sure you discuss any questions you have with your health care provider. Document Revised: 12/08/2019 Document Reviewed: 07/31/2018 Elsevier Patient Education  2022 [employer].    If you have been instructed to have an in-person evaluation today at a local Urgent Care facility, please use the link below. It will take you to a list of all of our available [facility], including address, phone number and hours of operation. Please do not delay care.  [facility]  If you or a family member do not have a primary care provider, use the link below to schedule a visit and establish care. When you choose a [facility] primary care physician or advanced practice provider, you gain a long-term partner in health. Find a Primary Care Provider  Learn more about [facility] in-office and virtual care options: [facility]

## 2020-10-19 NOTE — Telephone Encounter (Signed)
Error

## 2020-12-06 DIAGNOSIS — E039 Hypothyroidism, unspecified: Secondary | ICD-10-CM | POA: Diagnosis not present

## 2020-12-06 DIAGNOSIS — E291 Testicular hypofunction: Secondary | ICD-10-CM | POA: Diagnosis not present

## 2020-12-06 DIAGNOSIS — M7710 Lateral epicondylitis, unspecified elbow: Secondary | ICD-10-CM | POA: Diagnosis not present

## 2020-12-06 DIAGNOSIS — E559 Vitamin D deficiency, unspecified: Secondary | ICD-10-CM | POA: Diagnosis not present

## 2020-12-06 DIAGNOSIS — R7989 Other specified abnormal findings of blood chemistry: Secondary | ICD-10-CM | POA: Diagnosis not present

## 2020-12-06 DIAGNOSIS — E538 Deficiency of other specified B group vitamins: Secondary | ICD-10-CM | POA: Diagnosis not present

## 2020-12-06 DIAGNOSIS — R5383 Other fatigue: Secondary | ICD-10-CM | POA: Diagnosis not present

## 2020-12-06 DIAGNOSIS — R790 Abnormal level of blood mineral: Secondary | ICD-10-CM | POA: Diagnosis not present

## 2020-12-19 ENCOUNTER — Other Ambulatory Visit: Payer: Self-pay | Admitting: Internal Medicine

## 2020-12-19 DIAGNOSIS — F418 Other specified anxiety disorders: Secondary | ICD-10-CM

## 2021-01-09 ENCOUNTER — Other Ambulatory Visit: Payer: Self-pay

## 2021-01-09 ENCOUNTER — Encounter: Payer: Self-pay | Admitting: Internal Medicine

## 2021-01-09 ENCOUNTER — Ambulatory Visit (INDEPENDENT_AMBULATORY_CARE_PROVIDER_SITE_OTHER): Payer: BC Managed Care – PPO | Admitting: Internal Medicine

## 2021-01-09 VITALS — BP 126/78 | HR 80 | Temp 98.4°F | Resp 16 | Ht 69.0 in | Wt 196.0 lb

## 2021-01-09 DIAGNOSIS — Z0000 Encounter for general adult medical examination without abnormal findings: Secondary | ICD-10-CM

## 2021-01-09 DIAGNOSIS — U071 COVID-19: Secondary | ICD-10-CM | POA: Diagnosis not present

## 2021-01-09 DIAGNOSIS — E039 Hypothyroidism, unspecified: Secondary | ICD-10-CM

## 2021-01-09 DIAGNOSIS — I10 Essential (primary) hypertension: Secondary | ICD-10-CM | POA: Diagnosis not present

## 2021-01-09 DIAGNOSIS — Z23 Encounter for immunization: Secondary | ICD-10-CM

## 2021-01-09 DIAGNOSIS — N281 Cyst of kidney, acquired: Secondary | ICD-10-CM

## 2021-01-09 NOTE — Patient Instructions (Signed)

## 2021-01-09 NOTE — Progress Notes (Signed)
Subjective:  Patient ID: Jacob [name], male    DOB: [birth year]  Age: 40 y.o. MRN: [id number]  CC: Annual Exam  This visit occurred during the SARS-CoV-2 public health emergency.  Safety protocols were in place, including screening questions prior to the visit, additional usage of staff PPE, and extensive cleaning of exam room while observing appropriate contact time as indicated for disinfecting solutions.    HPI Jacob [name] presents for a CPX and f/up -   He is controlling his blood pressure with lifestyle modifications.  He is active and denies chest pain, shortness of breath, diaphoresis, or edema.  I started feeling like crap today and felt like flu so took COVID test out of precaution. I am positive for COVID. Mild fever 99.6 and I am quadruple vaxed, last vaccine was 11/13/20 Moderna. If you were me would you take paxlovid or just ride it out?    Outpatient Medications Prior to Visit  Medication Sig Dispense Refill   clomiPHENE (CLOMID) 50 MG tablet Take 50 mg by mouth. 50 mg M,W,F and 25 mg T,Th,S,Su     DULoxetine (CYMBALTA) 30 MG capsule TAKE 1 CAPSULE (30 MG TOTAL) BY MOUTH DAILY. MUST KEEP APPT FOR FUTURE REFILLS 90 capsule 1   levocetirizine (XYZAL) 5 MG tablet TAKE 2 TABLETS (10 MG TOTAL) BY MOUTH AT BEDTIME. 180 tablet 1   pantoprazole (PROTONIX) 40 MG tablet TAKE ONE TABLET BY MOUTH 30 TO 60 MINUTES BEFORE BREAKFAST AND DINNER 180 tablet 1   UNABLE TO FIND Three grains nature thyroid     amoxicillin-clavulanate (AUGMENTIN) 875-125 MG tablet Take 1 tablet by mouth 2 (two) times daily. 20 tablet 0   benzonatate (TESSALON) 100 MG capsule Take 1 capsule (100 mg total) by mouth 3 (three) times daily as needed. 30 capsule 0   meloxicam (MOBIC) 15 MG tablet Take 1 tablet (15 mg total) by mouth daily. 30 tablet 0   predniSONE (STERAPRED UNI-PAK 21 TAB) 10 MG (21) TBPK tablet 6 day taper; take as directed on package instructions 21 tablet 0    promethazine-dextromethorphan (PROMETHAZINE-DM) 6.25-15 MG/5ML syrup Take 5 mLs by mouth 4 (four) times daily as needed for cough. 118 mL 0   No facility-administered medications prior to visit.    ROS Review of Systems  Constitutional:  Positive for chills, fatigue and fever.  HENT:  Positive for sore throat.   Eyes: Negative.   Respiratory:  Positive for cough. Negative for chest tightness, shortness of breath and wheezing.   Cardiovascular:  Negative for chest pain, palpitations and leg swelling.  Gastrointestinal:  Negative for abdominal pain, diarrhea, nausea and vomiting.  Endocrine: Negative.  Negative for cold intolerance and heat intolerance.  Genitourinary: Negative.  Negative for difficulty urinating, scrotal swelling and testicular pain.  Musculoskeletal:  Positive for arthralgias.  Skin: Negative.  Negative for color change and rash.  Neurological:  Negative for dizziness, weakness, light-headedness and headaches.  Hematological:  Negative for adenopathy. Does not bruise/bleed easily.  Psychiatric/Behavioral: Negative.     Objective:  BP 126/78 (BP Location: Left Arm, Patient Position: Sitting, Cuff Size: Large)    Pulse 80    Temp 98.4 F (36.9 C) (Oral)    Resp 16    Ht 5\' 9"  (1.753 m)    Wt 196 lb (88.9 kg)    SpO2 97%    BMI 28.94 kg/m   BP Readings from Last 3 Encounters:  01/09/21 126/78  09/28/20 120/82  06/01/20 138/88  Wt Readings from Last 3 Encounters:  01/09/21 196 lb (88.9 kg)  09/28/20 193 lb (87.5 kg)  06/01/20 189 lb 12.8 oz (86.1 kg)    Physical Exam Vitals reviewed.  Constitutional:      Appearance: Normal appearance.  HENT:     Nose: Nose normal.     Mouth/Throat:     Mouth: Mucous membranes are moist.  Eyes:     General: No scleral icterus.    Conjunctiva/sclera: Conjunctivae normal.  Cardiovascular:     Rate and Rhythm: Normal rate and regular rhythm.     Pulses: Normal pulses.     Heart sounds: No murmur heard. Pulmonary:      Effort: Pulmonary effort is normal.     Breath sounds: No stridor. No wheezing, rhonchi or rales.  Abdominal:     General: Abdomen is flat.     Palpations: There is no mass.     Tenderness: There is no abdominal tenderness. There is no guarding.     Hernia: No hernia is present. There is no hernia in the left inguinal area or right inguinal area.  Musculoskeletal:        General: Normal range of motion.     Cervical back: Neck supple.     Right lower leg: No edema.     Left lower leg: No edema.  Lymphadenopathy:     Cervical: No cervical adenopathy.  Skin:    General: Skin is warm and dry.     Findings: No rash.  Neurological:     General: No focal deficit present.     Mental Status: He is alert. Mental status is at baseline.  Psychiatric:        Mood and Affect: Mood normal.        Behavior: Behavior normal.    Lab Results  Component Value Date   WBC 5.9 01/10/2021   HGB 15.3 01/10/2021   HCT 45.0 01/10/2021   PLT 198.0 01/10/2021   GLUCOSE 95 01/10/2021   CHOL 157 01/10/2021   TRIG 93.0 01/10/2021   HDL 53.70 01/10/2021   LDLCALC 84 01/10/2021   ALT 29 01/10/2021   AST 28 01/10/2021   NA 139 01/10/2021   K 3.9 01/10/2021   CL 103 01/10/2021   CREATININE 0.98 01/10/2021   BUN 10 01/10/2021   CO2 30 01/10/2021   TSH 0.03 (L) 01/10/2021   PSA 0.53 01/10/2021   HGBA1C 5.3 10/14/2016    MR WRIST LEFT W CONTRAST  Result Date: 12/10/2019 CLINICAL DATA:  Ulnar wrist pain for 8-9 months. Remote arthroscopy. Carpal tunnel syndrome suspected. EXAM: MRI OF THE LEFT WRIST WITH CONTRAST (MR Arthrogram) TECHNIQUE: Multiplanar, multisequence MR imaging of the wrist was performed immediately following contrast injection into the radiocarpal joint under fluoroscopic guidance. No intravenous contrast was administered. COMPARISON:  Injection images same date. Wrist radiographs 11/03/2019. FINDINGS: Ligaments: The scapholunate and lunotriquetral ligaments appear intact. There is no  contrast in the midcarpal compartment. Triangular fibrocartilage: The triangular fibrocartilage complex appears intact. There is no contrast in the distal radioulnar compartment. The ulnar variance is neutral. Tendons: There is dorsal leakage of contrast at the injection site with partial opacification of the extensor tendon sheaths. Possible mild extensor carpi ulnaris tenosynovitis without tear. The extensor tendons otherwise appear normal. The flexor tendons appear normal. Carpal tunnel/median nerve: Unremarkable. Guyon's canal: Unremarkable. Joint/cartilage: The radiocarpal joint is well opacified with contrast. No focal chondral defect. No opacification of the midcarpal or distal radioulnar compartments. Bones/carpal alignment: The  carpal bone alignment is normal.There are no significant extra-articular osseous findings. Other: The periarticular soft tissues appear normal. No ganglia are identified. IMPRESSION: 1. Possible mild extensor carpi ulnaris tenosynovitis without tear. 2. The intercarpal and TFCC appear intact. 3. The contents of the carpal tunnel appear normal. Electronically Signed   By: Richardean Sale M.D.   On: 12/10/2019 09:07   DG FLUORO GUIDED NEEDLE PLC ASPIRATION/INJECTION LOC  Result Date: 12/08/2019 CLINICAL DATA:  Left wrist pain. FLUOROSCOPY TIME:  Fluoroscopy Time: 10 seconds Radiation Exposure Index: 0.26 microGray*m^2 PROCEDURE: LEFT WRIST INJECTION UNDER FLUOROSCOPY An appropriate skin entrance site was determined. The site was marked, prepped with Betadine, draped in the usual sterile fashion, and infiltrated locally with 1% Lidocaine. A 25 gauge skin needle was advanced into the radiocarpal joint under intermittent fluoroscopy. A mixture of 0.1 mL of MultiHance, 15 mL of Isovue-M 200, and 5 mL of sterile saline was then used to opacify the proximal carpal joint. 2 mL of this mixture were injected. No immediate complication. IMPRESSION: Technically successful left wrist  injection for MRI. Electronically Signed   By: Logan Bores M.D.   On: 12/08/2019 16:28    Assessment & Plan:   Tarig was seen today for annual exam.  Diagnoses and all orders for this visit:  Acquired hypothyroidism- His TSH is suppressed.  His thyroid supplement is prescribed by someone else. -     TSH; Future -     Hepatic function panel; Future -     Hepatic function panel -     TSH  Essential hypertension- His blood pressure is adequately well controlled. -     CBC with Differential/Platelet; Future -     Basic metabolic panel; Future -     TSH; Future -     Hepatic function panel; Future -     Hepatic function panel -     TSH -     Basic metabolic panel -     CBC with Differential/Platelet  Renal cyst, left- His urinalysis is normal. -     Urinalysis, Routine w reflex microscopic; Future -     Urinalysis, Routine w reflex microscopic  Routine general medical examination at a health care facility- Exam completed, labs reviewed, vaccines reviewed and updated, patient education was given. -     Lipid panel; Future -     PSA; Future -     PSA -     Lipid panel  Acute COVID-19- Will treat with boosted nirmatrelvir. -     nirmatrelvir/ritonavir EUA (PAXLOVID) 20 x 150 MG & 10 x 100MG  TABS; Take 3 tablets by mouth 2 (two) times daily for 5 days. (Take nirmatrelvir 150 mg two tablets twice daily for 5 days and ritonavir 100 mg one tablet twice daily for 5 days) Patient GFR is 90  Other orders -     Tdap vaccine greater than or equal to 7yo IM   I have discontinued Pearletha [place]. Bogue's meloxicam, amoxicillin-clavulanate, benzonatate, predniSONE, and promethazine-dextromethorphan. I am also having him start on nirmatrelvir/ritonavir EUA. Additionally, I am having him maintain his clomiPHENE, UNABLE TO FIND, levocetirizine, pantoprazole, and DULoxetine.  Meds ordered this encounter  Medications   nirmatrelvir/ritonavir EUA (PAXLOVID) 20 x 150 MG & 10 x 100MG  TABS     Sig: Take 3 tablets by mouth 2 (two) times daily for 5 days. (Take nirmatrelvir 150 mg two tablets twice daily for 5 days and ritonavir 100 mg one tablet twice daily for 5 days) Patient  GFR is 90    Dispense:  30 tablet    Refill:  0     Follow-up: Return in about 6 months (around 07/10/2021).  Scarlette Calico, MD

## 2021-01-10 ENCOUNTER — Encounter: Payer: Self-pay | Admitting: Internal Medicine

## 2021-01-10 LAB — URINALYSIS, ROUTINE W REFLEX MICROSCOPIC
Bilirubin Urine: NEGATIVE
Hgb urine dipstick: NEGATIVE
Ketones, ur: NEGATIVE
Leukocytes,Ua: NEGATIVE
Nitrite: NEGATIVE
RBC / HPF: NONE SEEN (ref 0–?)
Specific Gravity, Urine: <=1.005 — AB (ref 1.000–1.030)
Total Protein, Urine: NEGATIVE
Urine Glucose: NEGATIVE
Urobilinogen, UA: 0.2 (ref 0.0–1.0)
WBC, UA: NONE SEEN (ref 0–?)
pH: 6 (ref 5.0–8.0)

## 2021-01-10 LAB — CBC WITH DIFFERENTIAL/PLATELET
Basophils Absolute: 0 10*3/uL (ref 0.0–0.1)
Basophils Relative: 0.4 % (ref 0.0–3.0)
Eosinophils Absolute: 0.1 10*3/uL (ref 0.0–0.7)
Eosinophils Relative: 1.1 % (ref 0.0–5.0)
HCT: 45 % (ref 39.0–52.0)
Hemoglobin: 15.3 g/dL (ref 13.0–17.0)
Lymphocytes Relative: 21.8 % (ref 12.0–46.0)
Lymphs Abs: 1.3 10*3/uL (ref 0.7–4.0)
MCHC: 34 g/dL (ref 30.0–36.0)
MCV: 91.9 fl (ref 78.0–100.0)
Monocytes Absolute: 1 10*3/uL (ref 0.1–1.0)
Monocytes Relative: 17 % — ABNORMAL HIGH (ref 3.0–12.0)
Neutro Abs: 3.5 10*3/uL (ref 1.4–7.7)
Neutrophils Relative %: 59.7 % (ref 43.0–77.0)
Platelets: 198 10*3/uL (ref 150.0–400.0)
RBC: 4.9 Mil/uL (ref 4.22–5.81)
RDW: 12.5 % (ref 11.5–15.5)
WBC: 5.9 10*3/uL (ref 4.0–10.5)

## 2021-01-10 LAB — LIPID PANEL
Cholesterol: 157 mg/dL (ref 0–200)
HDL: 53.7 mg/dL (ref >39.00)
LDL Cholesterol: 84 mg/dL (ref 0–99)
NonHDL: 103.01
Total CHOL/HDL Ratio: 3
Triglycerides: 93 mg/dL (ref 0.0–149.0)
VLDL: 18.6 mg/dL (ref 0.0–40.0)

## 2021-01-10 LAB — HEPATIC FUNCTION PANEL
ALT: 29 U/L (ref 0–53)
AST: 28 U/L (ref 0–37)
Albumin: 4.1 g/dL (ref 3.5–5.2)
Alkaline Phosphatase: 64 U/L (ref 39–117)
Bilirubin, Direct: 0.1 mg/dL (ref 0.0–0.3)
Total Bilirubin: 0.9 mg/dL (ref 0.2–1.2)
Total Protein: 6.3 g/dL (ref 6.0–8.3)

## 2021-01-10 LAB — BASIC METABOLIC PANEL
BUN: 10 mg/dL (ref 6–23)
CO2: 30 mEq/L (ref 19–32)
Calcium: 9.5 mg/dL (ref 8.4–10.5)
Chloride: 103 mEq/L (ref 96–112)
Creatinine, Ser: 0.98 mg/dL (ref 0.40–1.50)
GFR: 96.37 mL/min (ref >60.00)
Glucose, Bld: 95 mg/dL (ref 70–99)
Potassium: 3.9 mEq/L (ref 3.5–5.1)
Sodium: 139 mEq/L (ref 135–145)

## 2021-01-10 LAB — TSH: TSH: 0.03 u[IU]/mL — ABNORMAL LOW (ref 0.35–5.50)

## 2021-01-10 LAB — PSA: PSA: 0.53 ng/mL (ref 0.10–4.00)

## 2021-01-11 ENCOUNTER — Other Ambulatory Visit: Payer: Self-pay | Admitting: Internal Medicine

## 2021-01-11 DIAGNOSIS — U071 COVID-19: Secondary | ICD-10-CM | POA: Insufficient documentation

## 2021-01-11 MED ORDER — NIRMATRELVIR/RITONAVIR (PAXLOVID)TABLET: 3.0000 | ORAL_TABLET | Freq: Two times a day (BID) | ORAL | 0 refills | Status: DC

## 2021-01-12 MED ORDER — NIRMATRELVIR/RITONAVIR (PAXLOVID)TABLET: 3.0000 | ORAL_TABLET | Freq: Two times a day (BID) | ORAL | 0 refills | Status: AC

## 2021-01-18 ENCOUNTER — Other Ambulatory Visit: Payer: Self-pay | Admitting: Internal Medicine

## 2021-01-18 ENCOUNTER — Encounter: Payer: Self-pay | Admitting: Internal Medicine

## 2021-01-18 DIAGNOSIS — M7581 Other shoulder lesions, right shoulder: Secondary | ICD-10-CM | POA: Insufficient documentation

## 2021-01-18 MED ORDER — MELOXICAM 15 MG PO TABS: 15.0000 mg | ORAL_TABLET | Freq: Every day | ORAL | 0 refills | Status: DC

## 2021-01-29 ENCOUNTER — Encounter: Payer: Self-pay | Admitting: Internal Medicine

## 2021-01-29 ENCOUNTER — Other Ambulatory Visit: Payer: Self-pay | Admitting: Internal Medicine

## 2021-01-29 DIAGNOSIS — K5900 Constipation, unspecified: Secondary | ICD-10-CM | POA: Insufficient documentation

## 2021-02-26 ENCOUNTER — Ambulatory Visit: Payer: Self-pay

## 2021-02-26 ENCOUNTER — Ambulatory Visit (INDEPENDENT_AMBULATORY_CARE_PROVIDER_SITE_OTHER): Payer: BC Managed Care – PPO | Admitting: Family Medicine

## 2021-02-26 VITALS — BP 130/86 | HR 76 | Ht 69.0 in | Wt 197.6 lb

## 2021-02-26 DIAGNOSIS — S86112A Strain of other muscle(s) and tendon(s) of posterior muscle group at lower leg level, left leg, initial encounter: Secondary | ICD-10-CM

## 2021-02-26 DIAGNOSIS — M79662 Pain in left lower leg: Secondary | ICD-10-CM

## 2021-02-26 NOTE — Progress Notes (Signed)
° °  I, [name], LAT, ATC, am serving as scribe for [name].  Jacob [name] is a 41 y.o. male who presents to [place] at [facility] today for L calf pain. He was last seen by Dr. Georgina Snell on 06/01/20 for R hip pain.  Today, pt reports L calf pain since yesterday. Pt was playing tennis yesterday and was running after a ball and felt a "pop" in his L calf, He locates his pain specifically to proximal portion of the L calf .  Swelling: no Aggravating factors: PF, walking Treatments tried: wrap, meloxicam   Pertinent review of systems: No fevers or chills  Relevant historical information: Avid tennis player   Exam:  BP 130/86    Pulse 76    Ht 5\' 9"  (1.753 m)    Wt 197 lb 9.6 oz (89.6 kg)    SpO2 98%    BMI 29.18 kg/m  General: Well Developed, well nourished, and in no acute distress.   MSK: Left calf normal-appearing Normal knee and foot and ankle motion. Intact strength to foot plantarflexion however some pain with this motion. Tender palpation at medial head gastrocnemius muscle tendinous junction distally. Nontender with no defects within Achilles tendon.    Lab and Radiology Results  Diagnostic Limited MSK Ultrasound of: Left calf and Achilles tendon Of note due to an error with the keyboard during ultrasound image capture images were not correctly labeled.  I was unable to change the text that appeared with each image. Achilles tendon intact visible tear. Small amount of hypoechoic fluid collects just distal to the medial head of the gastrocnemius at distal muscle tendinous junction. This is an area of tenderness. No large visible tear present of the gastrocnemius or soleus muscle. Impression: Medial head gastrocnemius muscle strain or tear.      Assessment and Plan: 41 y.o. male with medial gastrocnemius muscle strain.  Plan for compression and eccentric exercises taught in clinic today by ATC.  Recheck back in about 3 weeks.  Return  sooner if needed.  Recommend abstaining from tennis activities until follow-up.   PDMP not reviewed this encounter. Orders Placed This Encounter  Procedures   [place] LIMITED JOINT SPACE STRUCTURES LOW LEFT(NO LINKED CHARGES)    Standing Status:   Future    Number of Occurrences:   1    Standing Expiration Date:   08/26/2021    Order Specific Question:   Reason for Exam (SYMPTOM  OR DIAGNOSIS REQUIRED)    Answer:   left calf pain    Order Specific Question:   Preferred imaging location?    Answer:   [place]   No orders of the defined types were placed in this encounter.    Discussed warning signs or symptoms. Please see discharge instructions. Patient expresses understanding.   The above documentation has been reviewed and is accurate and complete [name], M.D.

## 2021-02-26 NOTE — Patient Instructions (Addendum)
Thank you for coming in today.   I recommend you obtained a compression sleeve to help with your joint problems. There are many options on the market however I recommend obtaining a full calf Body Helix compression sleeve.  You can find information (including how to appropriate measure yourself for sizing) can be found at www.Body http://www.lambert.com/.  Many of these products are health savings account (HSA) eligible.  You can use the compression sleeve at any time throughout the day but is most important to use while being active as well as for 2 hours post-activity.   It is appropriate to ice following activity with the compression sleeve in place.   Please complete the exercises that the athletic trainer went over with you:  View at [email or web address]-exercise-code.com using code: 4LEA6T8  Recheck back in 3 weeks

## 2021-03-01 DIAGNOSIS — K219 Gastro-esophageal reflux disease without esophagitis: Secondary | ICD-10-CM | POA: Diagnosis not present

## 2021-03-01 DIAGNOSIS — R140 Abdominal distension (gaseous): Secondary | ICD-10-CM | POA: Diagnosis not present

## 2021-03-01 DIAGNOSIS — R1032 Left lower quadrant pain: Secondary | ICD-10-CM | POA: Diagnosis not present

## 2021-03-01 DIAGNOSIS — R194 Change in bowel habit: Secondary | ICD-10-CM | POA: Diagnosis not present

## 2021-03-05 DIAGNOSIS — E291 Testicular hypofunction: Secondary | ICD-10-CM | POA: Diagnosis not present

## 2021-03-05 DIAGNOSIS — R5383 Other fatigue: Secondary | ICD-10-CM | POA: Diagnosis not present

## 2021-03-05 DIAGNOSIS — E039 Hypothyroidism, unspecified: Secondary | ICD-10-CM | POA: Diagnosis not present

## 2021-03-05 DIAGNOSIS — E559 Vitamin D deficiency, unspecified: Secondary | ICD-10-CM | POA: Diagnosis not present

## 2021-03-05 DIAGNOSIS — R7989 Other specified abnormal findings of blood chemistry: Secondary | ICD-10-CM | POA: Diagnosis not present

## 2021-03-05 DIAGNOSIS — R4586 Emotional lability: Secondary | ICD-10-CM | POA: Diagnosis not present

## 2021-03-05 DIAGNOSIS — R790 Abnormal level of blood mineral: Secondary | ICD-10-CM | POA: Diagnosis not present

## 2021-03-05 DIAGNOSIS — E538 Deficiency of other specified B group vitamins: Secondary | ICD-10-CM | POA: Diagnosis not present

## 2021-03-09 DIAGNOSIS — R1032 Left lower quadrant pain: Secondary | ICD-10-CM | POA: Diagnosis not present

## 2021-03-09 DIAGNOSIS — K635 Polyp of colon: Secondary | ICD-10-CM | POA: Diagnosis not present

## 2021-03-09 DIAGNOSIS — K219 Gastro-esophageal reflux disease without esophagitis: Secondary | ICD-10-CM | POA: Diagnosis not present

## 2021-03-09 DIAGNOSIS — D125 Benign neoplasm of sigmoid colon: Secondary | ICD-10-CM | POA: Diagnosis not present

## 2021-03-09 DIAGNOSIS — R194 Change in bowel habit: Secondary | ICD-10-CM | POA: Diagnosis not present

## 2021-03-09 DIAGNOSIS — Z1211 Encounter for screening for malignant neoplasm of colon: Secondary | ICD-10-CM | POA: Diagnosis not present

## 2021-03-09 DIAGNOSIS — D120 Benign neoplasm of cecum: Secondary | ICD-10-CM | POA: Diagnosis not present

## 2021-03-15 DIAGNOSIS — F39 Unspecified mood [affective] disorder: Secondary | ICD-10-CM | POA: Diagnosis not present

## 2021-03-15 DIAGNOSIS — E291 Testicular hypofunction: Secondary | ICD-10-CM | POA: Diagnosis not present

## 2021-03-15 DIAGNOSIS — E60 Dietary zinc deficiency: Secondary | ICD-10-CM | POA: Diagnosis not present

## 2021-03-15 DIAGNOSIS — E039 Hypothyroidism, unspecified: Secondary | ICD-10-CM | POA: Diagnosis not present

## 2021-03-19 ENCOUNTER — Ambulatory Visit: Payer: BC Managed Care – PPO | Admitting: Family Medicine

## 2021-03-22 ENCOUNTER — Other Ambulatory Visit: Payer: Self-pay | Admitting: Internal Medicine

## 2021-03-22 DIAGNOSIS — R790 Abnormal level of blood mineral: Secondary | ICD-10-CM | POA: Diagnosis not present

## 2021-03-25 DIAGNOSIS — L02211 Cutaneous abscess of abdominal wall: Secondary | ICD-10-CM | POA: Diagnosis not present

## 2021-03-28 ENCOUNTER — Encounter: Payer: Self-pay | Admitting: Family Medicine

## 2021-04-06 DIAGNOSIS — R790 Abnormal level of blood mineral: Secondary | ICD-10-CM | POA: Diagnosis not present

## 2021-04-16 ENCOUNTER — Telehealth: Payer: Self-pay | Admitting: Family Medicine

## 2021-04-16 NOTE — Progress Notes (Signed)
? ?  I, Jacob [name], LAT, ATC, am serving as scribe for [name]. ? ?[name] is a 41 y.o. male who presents to [place] at [facility] today for f/u of L calf pain/strain after re-injuring his L calf while playing tennis on 04/15/21.  He was last seen by Dr. Georgina Snell on 02/26/21 for the same injury when he felt a "pop" in his L calf while running after a ball playing tennis.  He was advised to wear a calf compression sleeve and to do eccentric calf exercises.  Today, pt reports L calf pain since 04/15/21 when he re-injured his calf while playing tennis while moving laterally for a backhand.  He locates his pain to his L proximal calf.  He notes increased pain w/ quick movements or when doing the elliptical and raising onto the ball of his foot.   ? ? ? ?Pertinent review of systems: No fevers or chills ? ?Relevant historical information: Hypertension ? ? ?Exam:  ?BP 120/80 (BP Location: Right Arm, Patient Position: Sitting, Cuff Size: Normal)   Pulse 72   Ht '5\' 9"'$  (1.753 m)   Wt 197 lb 3.2 oz (89.4 kg)   SpO2 96%   BMI 29.12 kg/m?  ?General: Well Developed, well nourished, and in no acute distress.  ? ?MSK: Left calf: Normal. ?Mildly tender palpation at medial gastrocnemius muscle belly.  ?Normal foot and ankle motion and strength. ? ? ? ?Assessment and Plan: ?41 y.o. male with recurrent calf muscle strain.  He had a similar but worse injury occurring on February 6 and nearly completely healed before he had a recurrence playing tennis.  Plan to be a bit more conservative this time and refer to physical therapy and use nitroglycerin patch protocol.  Abstain from tennis for at least 6 weeks.  Recheck in 8 weeks.  If this is not sufficient we will recommend extracorporeal shockwave therapy. ? ? ?PDMP not reviewed this encounter. ?Orders Placed This Encounter  ?Procedures  ? Ambulatory referral to Physical Therapy  ?  Referral Priority:   Routine  ?  Referral Type:   Physical Medicine   ?  Referral Reason:   Specialty Services Required  ?  Requested Specialty:   Physical Therapy  ?  Number of Visits Requested:   1  ? ?Meds ordered this encounter  ?Medications  ? nitroGLYCERIN (NITRODUR - DOSED IN MG/24 HR) 0.2 mg/hr patch  ?  Sig: Place 1/4 to 1/2 of a patch over affected region. Remove and replace once daily.  Slightly alter skin placement daily  ?  Dispense:  30 patch  ?  Refill:  1  ?  For musculoskeletal purposes.  Okay to cut patch.  ? ? ? ?Discussed warning signs or symptoms. Please see discharge instructions. Patient expresses understanding. ? ? ?The above documentation has been reviewed and is accurate and complete [name], M.D. ? ? ?

## 2021-04-16 NOTE — Telephone Encounter (Signed)
Called pt and scheduled him for an appt on 04/17/21. ?

## 2021-04-16 NOTE — Telephone Encounter (Signed)
Patient called stating that he thinks he may have injured his calf again but wanted to talk to [facility] or [place] before scheduling an appointment. ? ?

## 2021-04-17 ENCOUNTER — Ambulatory Visit: Payer: Self-pay

## 2021-04-17 ENCOUNTER — Other Ambulatory Visit: Payer: Self-pay

## 2021-04-17 ENCOUNTER — Encounter: Payer: Self-pay | Admitting: Family Medicine

## 2021-04-17 ENCOUNTER — Ambulatory Visit (INDEPENDENT_AMBULATORY_CARE_PROVIDER_SITE_OTHER): Payer: BC Managed Care – PPO | Admitting: Family Medicine

## 2021-04-17 VITALS — BP 120/80 | HR 72 | Ht 69.0 in | Wt 197.2 lb

## 2021-04-17 DIAGNOSIS — M79662 Pain in left lower leg: Secondary | ICD-10-CM | POA: Diagnosis not present

## 2021-04-17 DIAGNOSIS — S86112D Strain of other muscle(s) and tendon(s) of posterior muscle group at lower leg level, left leg, subsequent encounter: Secondary | ICD-10-CM | POA: Diagnosis not present

## 2021-04-17 MED ORDER — NITROGLYCERIN 0.2 MG/HR TD PT24: MEDICATED_PATCH | TRANSDERMAL | 1 refills | Status: DC

## 2021-04-17 NOTE — Patient Instructions (Addendum)
Good to see you today. ? ?I've referred you to Physical Therapy.  Their office will call you to schedule but please let us know if you haven't heard from them in one week regarding scheduling. ? ?Nitroglycerin Protocol ? ?Apply 1/4 nitroglycerin patch to affected area daily. ?Change position of patch within the affected area every 24 hours. ?You may experience a headache during the first 1-2 weeks of using the patch, these should subside. ?If you experience headaches after beginning nitroglycerin patch treatment, you may take your preferred over the counter pain reliever. ?Another side effect of the nitroglycerin patch is skin irritation or rash related to patch adhesive. ?Please notify our office if you develop more severe headaches or rash, and stop the patch. ?Tendon healing with nitroglycerin patch may require 12 to 24 weeks depending on the extent of injury. ?Men should not use if taking Viagra, Cialis, or Levitra.  ?Do not use if you have migraines or rosacea.   ? ?Follow-up: 2 months ?

## 2021-04-20 ENCOUNTER — Encounter: Payer: Self-pay | Admitting: Physical Therapy

## 2021-04-20 ENCOUNTER — Ambulatory Visit: Payer: BC Managed Care – PPO | Attending: Family Medicine | Admitting: Physical Therapy

## 2021-04-20 DIAGNOSIS — M6281 Muscle weakness (generalized): Secondary | ICD-10-CM | POA: Insufficient documentation

## 2021-04-20 DIAGNOSIS — S86112D Strain of other muscle(s) and tendon(s) of posterior muscle group at lower leg level, left leg, subsequent encounter: Secondary | ICD-10-CM | POA: Insufficient documentation

## 2021-04-20 DIAGNOSIS — M79662 Pain in left lower leg: Secondary | ICD-10-CM | POA: Diagnosis not present

## 2021-04-20 DIAGNOSIS — R2681 Unsteadiness on feet: Secondary | ICD-10-CM | POA: Diagnosis not present

## 2021-04-20 NOTE — Therapy (Signed)
?OUTPATIENT PHYSICAL THERAPY LOWER EXTREMITY EVALUATION ? ? ?Patient Name: Jacob [name] ?MRN: [id number] ?DOB:[birth year], 41 y.o., male ?Today's Date: 04/20/2021 ? ? PT End of Session - 04/20/21 1106   ? ? Visit Number 1   ? Date for PT Re-Evaluation 07/13/21   ? Authorization Type BCBS   ? PT Start Time 2200   ? PT Stop Time 2238   ? PT Time Calculation (min) 38 min   ? ?  ?  ? ?  ? ? ?Past Medical History:  ?Diagnosis Date  ? Anxiety   ? 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 Depression   ? Genital warts   ? GERD (gastroesophageal reflux disease)   ? w/ HH  ? H/O alcohol abuse   ? sober since 02/04/2007  ? Hypothyroid   ? ?Past Surgical History:  ?Procedure Laterality Date  ? WRIST SURGERY Left   ? ?Patient Active Problem List  ? Diagnosis Date Noted  ? Constipation 01/29/2021  ? Rotator cuff tendinitis, right 01/18/2021  ? Chronic right hip pain 05/29/2020  ? Essential hypertension 12/10/2019  ? Tinea pedis of both feet 12/03/2019  ? Biceps tendinitis of right upper extremity 02/05/2019  ? Lateral epicondylitis of right elbow 05/01/2017  ? Family history of brain aneurysm 11/05/2016  ? Renal cyst, left 03/24/2016  ? Routine general medical examination at a health care facility 11/22/2015  ? Hypothyroid   ? ? ?PCP: Janith Lima, MD ? ?REFERRING PROVIDER: Gregor Hams, MD ? ?THERAPY DIAG:  ?Pain of left calf - Plan: PT plan of care cert/re-cert ? ?Muscle weakness - Plan: PT plan of care cert/re-cert ? ?Unsteadiness on feet - Plan: PT plan of care cert/re-cert ? ?REFERRING DIAG: Pain of left calf [M79.662], Strain of gastrocnemius muscle of left lower extremity, subsequent encounter [S86.112D] ? ?SUBJECTIVE: ? ?PERTINENT PAST HISTORY:  ?none   ?     ?PRECAUTIONS: None ? ?WEIGHT BEARING RESTRICTIONS No ? ?FALLS:  ?Has patient fallen in last 6 months? No, Number of falls: 0 ? ?LIVING ENVIRONMENT: ?Lives with: lives with their  spouse ? ?MOI/History of condition: ? ?Onset date: 04/15/21 ? ?Jacob [name] is a 41 y.o. male who presents to clinic with chief complaint of L proximal medial calf pain starting after he felt a pop on 2/6 while playing tennis.  He took a roughly 6 week rest before returning to tennis.  He subsequently had a similar injury 3/26.  He feels that it was not completely healed from the previous injury as he was still having pain while playing.   ? ?From referring provider:  ? ?"41 y.o. male with recurrent calf muscle strain.  He had a similar but worse injury occurring on February 6 and nearly completely healed before he had a recurrence playing tennis.  Plan to be a bit more conservative this time and refer to physical therapy and use nitroglycerin patch protocol.  Abstain from tennis for at least 6 weeks.  Recheck in 8 weeks.  If this is not sufficient we will recommend extracorporeal shockwave therapy." ? ? Red flags:  ?denies  ? ?Pain:  ?Are you having pain? Yes ?Pain location: L proximal medial gastroc ?NPRS scale:  ?current 0/10  ?Aggravating factors: elliptical, stretching, active PF ? NPRS, highest: 8/10 ?Relieving factors: rest ? NPRS: best: 0/10 ?Pain description: intermittent and sharp ?Stage: Acute ?Stability: getting better ? ?Occupation: desk job ? ?  Assistive Device: no ? ?Hand Dominance: NA ? ?Patient Goals/Specific Activities: get back to tennis, activity ? ? ?PLOF: Independent ? ?DIAGNOSTIC FINDINGS:  ? ?Achilles tendon intact visible tear. ?Small amount of hypoechoic fluid collects just distal to the medial head of the gastrocnemius at distal muscle tendinous junction. ?This is an area of tenderness. ?No large visible tear present of the gastrocnemius or soleus muscle. ?Impression: Medial head gastrocnemius muscle strain or tear. ? ? ?OBJECTIVE:  ? ? GENERAL OBSERVATION/GAIT: ?  Antalgic gait in toe walking ? ?SENSATION: ? Light touch: Appears intact ? ?PALPATION: ?TTP proximal medial  gastor ? ? ?LE MMT: ? ?MMT Right ?04/20/2021 Left ?04/20/2021  ?Hip flexion (L2, L3)    ?Knee extension (L3) 5 5  ?Knee flexion 5 4+  ?Hip abduction 5 4+  ?Hip extension 5 5  ?Hip external rotation    ?Hip internal rotation    ?Hip adduction    ?Ankle dorsiflexion (L4) Toe walk N Toe walk N w/ pain  ?Ankle plantarflexion (S1) SL HR N SL HR partial ROM w/ pain  ?Ankle inversion    ?Ankle eversion    ?Great Toe ext (L5)    ?Grossly    ? ?(Blank rows = not tested, score listed is out of 5 possible points.  N = WNL, D = diminished, C = clear for gross weakness with myotome testing, * = concordant pain with testing) ? ?LE ROM: ? ?ROM Right ?04/20/2021 Left ?04/20/2021  ?Hip flexion    ?Hip extension    ?Hip abduction    ?Hip adduction    ?Hip internal rotation    ?Hip external rotation    ?Knee flexion    ?Knee extension    ?Ankle dorsiflexion Cc 40 Cc 35  ?Ankle plantarflexion    ?Ankle inversion    ?Ankle eversion    ? ?(Blank rows = not tested, N = WNL, * = concordant pain with testing) ? ?Functional Tests ? ?Eval (04/20/2021)    ?Toe walking - L P!    ?S/L heel raise - L P!    ?SLS on foam with contralateral hip flexion min instability with no need to toe touch    ?    ?    ?    ?    ?    ?    ?    ?    ?    ?    ?    ? ? ?PATIENT SURVEYS:  ?FOTO 61 - > 92 ? ? ?TODAY'S TREATMENT: ? ?Trigger Point Dry-Needling  ?Treatment instructions: Expect mild to moderate muscle soreness. S/S of pneumothorax if dry needled over a lung field, and to seek immediate medical attention should they occur. Patient verbalized understanding of these instructions and education. ? ?Patient Consent Given: Yes ?Education handout provided: Yes ?Muscles treated: Medial L gastroc ?Electrical stimulation performed: No ?Parameters: N/A ?Treatment response/outcome: twitch ? ? ? ?PATIENT EDUCATION:  ?POC, diagnosis, prognosis, HEP, and outcome measures.  Pt educated via explanation, demonstration, and handout (HEP).  Pt confirms understanding verbally.   ? ?ASTERISK SIGNS ? ? ?Asterisk Signs Eval (04/20/2021)       ?SL heel raise        ?hopping        ?        ?        ?        ? ? ?HOME EXERCISE PROGRAM: ?Access Code: [email or web address] ?URL: [email or web address] ?Date: 04/20/2021 ?Prepared by: Shearon Balo ? ?Exercises ?-  Long Sitting Calf Stretch with Strap  - 3 x daily - 7 x weekly - 1 sets - 3 reps - 45 hold ?- Standing Heel Raises  - 1 x daily - 7 x weekly - 3 sets - 10 reps ? ?ASSESSMENT: ? ?CLINICAL IMPRESSION: ?Jessica is a 41 y.o. male who presents to clinic with signs and sxs consistent with L calf strain while playing tennis.  This is an exacerbation of a previous strain sustained on 02/26/21.  We trialed TDN today and will monitor response.  I think he will do well with a home program concentrating on gentle stretching and some calf activation at home initially, we will progress and complete TDN PRN. ? ?OBJECTIVE IMPAIRMENTS: Pain, weakness L gastroc d/t pain, gait ? ?ACTIVITY LIMITATIONS: tennis, dynamic activity ? ?PERSONAL FACTORS: See medical history and pertinent history ? ? ?REHAB POTENTIAL: Good ? ?CLINICAL DECISION MAKING: Stable/uncomplicated ? ?EVALUATION COMPLEXITY: Low ? ? ?GOALS: ? ? ?SHORT TERM GOALS: ? ?Brydan will be >75% HEP compliant to improve carryover between sessions and facilitate independent management of condition ? ?Evaluation (04/20/2021): ongoing ?Target date: 06/15/2021 ?Goal status: INITIAL ? ? ?LONG TERM GOALS: ? ?Raydon will improve FOTO score to 92 as a proxy for functional improvement ? ?Evaluation/Baseline (04/20/2021): 71 ?Target date: 07/13/2021 ?Goal status: INITIAL ? ? ?2.  Kayton will self report >/= 50% decrease in pain from evaluation  ? ?Evaluation/Baseline (04/20/2021): 8/10 max pain ?Target date: 07/13/2021 ?Goal status: INITIAL ? ? ?3.  Roma will be able to perform >10x S/L heel raises, not limited by pain ? ?Evaluation/Baseline (04/20/2021): partial ROM with pain ?Target date: 07/13/2021 ?Goal  status: INITIAL ? ? ?4.  Calhoun will be able to return to tennis, not limited by pain ? ?Evaluation/Baseline (04/20/2021): limited ?Target date: 07/13/2021 ?Goal status: INITIAL ? ? ?PLAN: ?PT FREQUENCY: 1x/week to 1x/every 2 weeks

## 2021-05-11 ENCOUNTER — Ambulatory Visit: Payer: BC Managed Care – PPO | Attending: Family Medicine | Admitting: Physical Therapy

## 2021-05-11 ENCOUNTER — Encounter: Payer: Self-pay | Admitting: Physical Therapy

## 2021-05-11 DIAGNOSIS — M6281 Muscle weakness (generalized): Secondary | ICD-10-CM | POA: Insufficient documentation

## 2021-05-11 DIAGNOSIS — M79662 Pain in left lower leg: Secondary | ICD-10-CM | POA: Diagnosis not present

## 2021-05-11 DIAGNOSIS — R2681 Unsteadiness on feet: Secondary | ICD-10-CM | POA: Diagnosis not present

## 2021-05-11 NOTE — Therapy (Signed)
?OUTPATIENT PHYSICAL THERAPY TREATMENT NOTE ? ? ?Patient Name: Jacob [name] ?MRN: [id number] ?DOB:04/23/1980, 41 y.o., male ?Today's Date: 05/11/2021 ? ?PCP: Janith Lima, MD ?REFERRING PROVIDER: Janith Lima, MD ? ? PT End of Session - 05/11/21 1035   ? ? Visit Number 2   ? Date for PT Re-Evaluation 07/13/21   ? Authorization Type BCBS   ? PT Start Time 1000   ? PT Stop Time 1030   ? PT Time Calculation (min) 30 min   ? ?  ?  ? ?  ? ? ?Past Medical History:  ?Diagnosis Date  ? Anxiety   ? 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 Depression   ? Genital warts   ? GERD (gastroesophageal reflux disease)   ? w/ HH  ? H/O alcohol abuse   ? sober since 02/04/2007  ? Hypothyroid   ? ?Past Surgical History:  ?Procedure Laterality Date  ? WRIST SURGERY Left   ? ?Patient Active Problem List  ? Diagnosis Date Noted  ? Constipation 01/29/2021  ? Rotator cuff tendinitis, right 01/18/2021  ? Chronic right hip pain 05/29/2020  ? Essential hypertension 12/10/2019  ? Tinea pedis of both feet 12/03/2019  ? Biceps tendinitis of right upper extremity 02/05/2019  ? Lateral epicondylitis of right elbow 05/01/2017  ? Family history of brain aneurysm 11/05/2016  ? Renal cyst, left 03/24/2016  ? Routine general medical examination at a health care facility 11/22/2015  ? Hypothyroid   ? ? ?THERAPY DIAG:  ?Pain of left calf ? ?Muscle weakness ? ?Unsteadiness on feet ? ?REFERRING DIAG: Pain of left calf [M79.662], Strain of gastrocnemius muscle of left lower extremity, subsequent encounter [S86.112D] ? ?PERTINENT HISTORY: none ? ?PRECAUTIONS/RESTRICTIONS:  ? ?none ? ?SUBJECTIVE:  ?Pt report that he is doing well overall.  He has been HEP compliant. ? ?Pain:  ?Are you having pain? Yes ?Pain location: L proximal medial gastroc ?NPRS scale:  ?current 0/10  ?Aggravating factors: elliptical, stretching, active PF ?          NPRS, highest: 0/10 ?Relieving factors: rest ?           NPRS: best: 0/10 ?Pain description: intermittent and sharp ?Stage: Acute ?Stability: getting better ? ?OBJECTIVE: ? ?Impression: Medial head gastrocnemius muscle strain or tear. ? ?LE MMT: ?  ?MMT Right ?04/20/2021 Left ?04/20/2021  ?Hip flexion (L2, L3)      ?Knee extension (L3) 5 5  ?Knee flexion 5 4+  ?Hip abduction 5 4+  ?Hip extension 5 5  ?Hip external rotation      ?Hip internal rotation      ?Hip adduction      ?Ankle dorsiflexion (L4) Toe walk N Toe walk N w/ pain  ?Ankle plantarflexion (S1) SL HR N SL HR partial ROM w/ pain  ?Ankle inversion      ?Ankle eversion      ?Great Toe ext (L5)      ?Grossly      ?  ?(Blank rows = not tested, score listed is out of 5 possible points.  N = WNL, D = diminished, C = clear for gross weakness with myotome testing, * = concordant pain with testing) ?  ?LE ROM: ?  ?ROM Right ?04/20/2021 Left ?04/20/2021  ?Hip flexion      ?Hip extension      ?Hip abduction      ?Hip adduction      ?  Hip internal rotation      ?Hip external rotation      ?Knee flexion      ?Knee extension      ?Ankle dorsiflexion Cc 40 Cc 35  ?Ankle plantarflexion      ?Ankle inversion      ?Ankle eversion      ?  ?(Blank rows = not tested, N = WNL, * = concordant pain with testing) ?  ?Functional Tests ?  ?Eval (04/20/2021)      ?Toe walking - L P!      ?S/L heel raise - L P!      ?SLS on foam with contralateral hip flexion min instability with no need to toe touch      ?       ?       ?       ?       ?       ?       ?       ?       ?       ?       ?       ? ?ASTERISK SIGNS ?  ?  ?Asterisk Signs Eval (04/20/2021) 4/21           ?SL heel raise   3x10 no pain           ?hopping              ?               ?               ?               ?  ?  ?HOME EXERCISE PROGRAM: ?Access Code: [email or web address] ?URL: [email or web address] ?Date: 05/11/2021 ?Prepared by: Shearon Balo ? ?Program Notes ?After you are able to do 3x15 heel raises with good form add some weight (up to 20#).  You should be able to  complete 3 sets of 6-10 with no pain. ? ?Exercises ?- Eccentric Heel Lowering on Step  - 1 x daily - 3-4 x weekly - 3 sets - 15 reps ?- Eccentric Bent Knee Calf Raise on Step  - 1 x daily - 3-4 x weekly - 3 sets - 15 reps ?- Single Leg Deadlift with Kettlebell  - 1 x daily - 3-4 x weekly - 3 sets - 10 reps ? ?TREATMENT 4/21: ? ?Therapeutic Exercise: ?- bike - 5 min ?- heel raise on step - 3x15 ? - with knee bent ?-RDL 15# - 3x10 ? ?  ?ASSESSMENT: ?  ?CLINICAL IMPRESSION: ?Jacob [name] did well with therapy today.  He has been resuming some strengthening for the calf with no issues.  With slow tempo and full ROM, he was challenged with straight and bent knee heel raises on step today.  We discussed progression to weighted heel raise over the next several weeks. ?  ?OBJECTIVE IMPAIRMENTS: Pain, weakness L gastroc d/t pain, gait ?  ?ACTIVITY LIMITATIONS: tennis, dynamic activity ?  ?PERSONAL FACTORS: See medical history and pertinent history ?  ?  ?REHAB POTENTIAL: Good ?  ?CLINICAL DECISION MAKING: Stable/uncomplicated ?  ?EVALUATION COMPLEXITY: Low ?  ?  ?GOALS: ?  ?  ?SHORT TERM GOALS: ?  ?Jacob [name] will be >75% HEP compliant to improve carryover between sessions and facilitate independent management of condition ?  ?Evaluation (04/20/2021): ongoing ?Target date: 06/15/2021 ?Goal status: MET  4/21 ?  ?  ?LONG TERM GOALS: ?  ?Jacob [name] will improve FOTO score to 92 as a proxy for functional improvement ?  ?Evaluation/Baseline (04/20/2021): 71 ?Target date: 07/13/2021 ?Goal status: INITIAL ?  ?  ?2.  Jacob [name] will self report >/= 50% decrease in pain from evaluation  ?  ?Evaluation/Baseline (04/20/2021): 8/10 max pain ?Target date: 07/13/2021 ?Goal status: INITIAL ?  ?  ?3.  Jacob [name] will be able to perform >10x S/L heel raises, not limited by pain ?  ?Evaluation/Baseline (04/20/2021): partial ROM with pain ?Target date: 07/13/2021 ?Goal status: INITIAL ?  ?  ?4.  Jacob [name] will be able to return to tennis, not limited by pain ?   ?Evaluation/Baseline (04/20/2021): limited ?Target date: 07/13/2021 ?Goal status: INITIAL ?  ?  ?PLAN: ?PT FREQUENCY: 1x/week to 1x/every 2 weeks ?  ?PT DURATION: 8 weeks (Ending 07/13/2021) ?  ?PLANNED INTERVENTIONS: Therapeutic exercises, Aquatic therapy, Therapeutic activity, Neuro Muscular re-education, Gait training, Patient/Family education, Joint mobilization, Dry Needling, Electrical stimulation, Spinal mobilization and/or manipulation, Moist heat, Taping, Vasopneumatic device, Ionotophoresis 4mg /ml Dexamethasone, and Manual therapy ?  ?PLAN FOR NEXT SESSION: TDN PRN, progressive calf strengthening, balance, dynamic movements as appropriate  ? ? ? ?Mathis Dad PT ?05/11/2021, 10:36 AM ? ?  ? ?

## 2021-05-14 ENCOUNTER — Encounter: Payer: Self-pay | Admitting: Physical Therapy

## 2021-05-31 DIAGNOSIS — M25559 Pain in unspecified hip: Secondary | ICD-10-CM | POA: Diagnosis not present

## 2021-05-31 DIAGNOSIS — R5383 Other fatigue: Secondary | ICD-10-CM | POA: Diagnosis not present

## 2021-05-31 DIAGNOSIS — M25519 Pain in unspecified shoulder: Secondary | ICD-10-CM | POA: Diagnosis not present

## 2021-05-31 DIAGNOSIS — R790 Abnormal level of blood mineral: Secondary | ICD-10-CM | POA: Diagnosis not present

## 2021-06-01 ENCOUNTER — Encounter: Payer: Self-pay | Admitting: Physical Therapy

## 2021-06-01 ENCOUNTER — Ambulatory Visit: Payer: BC Managed Care – PPO | Attending: Family Medicine | Admitting: Physical Therapy

## 2021-06-01 DIAGNOSIS — R2681 Unsteadiness on feet: Secondary | ICD-10-CM | POA: Diagnosis not present

## 2021-06-01 DIAGNOSIS — M6281 Muscle weakness (generalized): Secondary | ICD-10-CM | POA: Diagnosis not present

## 2021-06-01 DIAGNOSIS — M79662 Pain in left lower leg: Secondary | ICD-10-CM | POA: Insufficient documentation

## 2021-06-01 NOTE — Therapy (Signed)
?PHYSICAL THERAPY DISCHARGE SUMMARY ? ?Visits from Start of Care: 3 ? ?Current functional level related to goals / functional outcomes: ?See assessment/goals ?  ?Remaining deficits: ?See assessment/goals ?  ?Education / Equipment: ?HEP and D/C plans ? ?Patient agrees to discharge. Patient goals were met. Patient is being discharged due to meeting the stated rehab goals. ? ? ?Patient Name: Jacob [name] ?MRN: [id number] ?DOB:Apr 25, 1980, 41 y.o., male ?Today's Date: 06/01/2021 ? ?PCP: Jacob Lima, MD ?REFERRING PROVIDER: Janith Lima, MD ? ? PT End of Session - 06/01/21 1000   ? ? Visit Number 3   ? Date for PT Re-Evaluation 07/13/21   ? Authorization Type BCBS   ? PT Start Time 1000   ? PT Stop Time 3532   ? PT Time Calculation (min) 40 min   ? ?  ?  ? ?  ? ? ?Past Medical History:  ?Diagnosis Date  ? Anxiety   ? 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 Depression   ? Genital warts   ? GERD (gastroesophageal reflux disease)   ? w/ HH  ? H/O alcohol abuse   ? sober since 02/04/2007  ? Hypothyroid   ? ?Past Surgical History:  ?Procedure Laterality Date  ? WRIST SURGERY Left   ? ?Patient Active Problem List  ? Diagnosis Date Noted  ? Constipation 01/29/2021  ? Rotator cuff tendinitis, right 01/18/2021  ? Chronic right hip pain 05/29/2020  ? Essential hypertension 12/10/2019  ? Tinea pedis of both feet 12/03/2019  ? Biceps tendinitis of right upper extremity 02/05/2019  ? Lateral epicondylitis of right elbow 05/01/2017  ? Family history of brain aneurysm 11/05/2016  ? Renal cyst, left 03/24/2016  ? Routine general medical examination at a health care facility 11/22/2015  ? Hypothyroid   ? ? ?THERAPY DIAG:  ?Pain of left calf ? ?Muscle weakness ? ?Unsteadiness on feet ? ?REFERRING DIAG: Pain of left calf [M79.662], Strain of gastrocnemius muscle of left lower extremity, subsequent encounter [S86.112D] ? ?PERTINENT HISTORY:  none ? ?PRECAUTIONS/RESTRICTIONS:  ? ?none ? ?SUBJECTIVE:  ?Pt reports that he has been doing well.  His calf was very sore after  ? ?Pain:  ?Are you having pain? Yes ?Pain location: L proximal medial gastroc ?NPRS scale:  ?current 0/10  ?Aggravating factors: elliptical, stretching, active PF ?          NPRS, highest: 0/10 ?Relieving factors: rest ?          NPRS: best: 0/10 ?Pain description: intermittent and sharp ?Stage: Acute ?Stability: getting better ? ?OBJECTIVE: ? ?Impression: Medial head gastrocnemius muscle strain or tear. ? ?LE MMT: ?  ?MMT Right ?04/20/2021 Left ?04/20/2021  ?Hip flexion (L2, L3)      ?Knee extension (L3) 5 5  ?Knee flexion 5 4+  ?Hip abduction 5 4+  ?Hip extension 5 5  ?Hip external rotation      ?Hip internal rotation      ?Hip adduction      ?Ankle dorsiflexion (L4) Toe walk N Toe walk N w/ pain  ?Ankle plantarflexion (S1) SL HR N SL HR partial ROM w/ pain  ?Ankle inversion      ?Ankle eversion      ?Great Toe ext (L5)      ?Grossly      ?  ?(Blank rows = not tested, score listed is out of 5 possible points.  N = WNL,  D = diminished, C = clear for gross weakness with myotome testing, * = concordant pain with testing) ?  ?LE ROM: ?  ?ROM Right ?04/20/2021 Left ?04/20/2021  ?Hip flexion      ?Hip extension      ?Hip abduction      ?Hip adduction      ?Hip internal rotation      ?Hip external rotation      ?Knee flexion      ?Knee extension      ?Ankle dorsiflexion Cc 40 Cc 35  ?Ankle plantarflexion      ?Ankle inversion      ?Ankle eversion      ?  ?(Blank rows = not tested, N = WNL, * = concordant pain with testing) ?  ?Functional Tests ?  ?Eval (04/20/2021)      ?Toe walking - L P!      ?S/L heel raise - L P!      ?SLS on foam with contralateral hip flexion min instability with no need to toe touch      ?       ?       ?       ?       ?       ?       ?       ?       ?       ?       ?       ? ?ASTERISK SIGNS ?  ?  ?Asterisk Signs Eval (04/20/2021) 4/21           ?SL heel raise   3x10 no pain            ?hopping              ?               ?               ?               ?  ?  ?HOME EXERCISE PROGRAM: ?Access Code: [email or web address] ?URL: [email or web address] ?Date: 05/11/2021 ?Prepared by: Shearon Balo ? ?Program Notes ?After you are able to do 3x15 heel raises with good form add some weight (up to 20#).  You should be able to complete 3 sets of 6-10 with no pain. ? ?Exercises ?- Eccentric Heel Lowering on Step  - 1 x daily - 3-4 x weekly - 3 sets - 15 reps ?- Eccentric Bent Knee Calf Raise on Step  - 1 x daily - 3-4 x weekly - 3 sets - 15 reps ?- Single Leg Deadlift with Kettlebell  - 1 x daily - 3-4 x weekly - 3 sets - 10 reps ? ?TREATMENT 5/12: ? ?Therapeutic Exercise: ?- bike - 5 min ?- heel raise on step - 2x15 ? - with knee bent ?- RDL 25# - 2x10 ?- SLS with pallof press on foam - 2x10 ea ?- Hopping progression to single leg with movement ? ?Therapeutic Activity ?- collecting information for goals, checking progress, and reviewing with patient ? ?TREATMENT 4/21: ? ?Therapeutic Exercise: ?- bike - 5 min ?- heel raise on step - 3x15 ? - with knee bent ?-RDL 15# - 3x10 ? ?  ?ASSESSMENT: ?  ?CLINICAL IMPRESSION: ?Jacob [name] has met all goals and feels prepared to continue HEP and slowly return to sport over the next 4 weeks. ?  ?  OBJECTIVE IMPAIRMENTS: Pain, weakness L gastroc d/t pain, gait ?  ?ACTIVITY LIMITATIONS: tennis, dynamic activity ?  ?PERSONAL FACTORS: See medical history and pertinent history ?  ?  ?REHAB POTENTIAL: Good ?  ?CLINICAL DECISION MAKING: Stable/uncomplicated ?  ?EVALUATION COMPLEXITY: Low ?  ?  ?GOALS: ?  ?  ?SHORT TERM GOALS: ?  ?Jacob [name] will be >75% HEP compliant to improve carryover between sessions and facilitate independent management of condition ?  ?Evaluation (04/20/2021): ongoing ?Target date: 06/15/2021 ?Goal status: MET 4/21 ?  ?  ?LONG TERM GOALS: ?  ?Jacob [name] will improve FOTO score to 92 as a proxy for functional improvement ?  ?Evaluation/Baseline  (04/20/2021): 71 ?Target date: 07/13/2021 ?Goal status: MET (5/12) 98.8 ?  ?  ?2.  Jacob [name] will self report >/= 50% decrease in pain from evaluation  ?  ?Evaluation/Baseline (04/20/2021): 8/10 max pain ?Target date: 07/13/2021 ?Goal status: MET 5/12 (0/10) ?  ?  ?3.  Koji will be able to perform >10x S/L heel raises, not limited by pain ?  ?Evaluation/Baseline (04/20/2021): partial ROM with pain ?Target date: 07/13/2021 ?Goal status: MET (5/12) 3x15 ?  ?  ?4.  Khaleem will be able to return to tennis, not limited by pain ?  ?Evaluation/Baseline (04/20/2021): limited ?Target date: 07/13/2021 ?Goal status: MET - will begin light return to full play over the next 4 weeks ?  ?  ?PLAN: ?PT FREQUENCY: 1x/week to 1x/every 2 weeks ?  ?PT DURATION: 8 weeks (Ending 07/13/2021) ?  ?PLANNED INTERVENTIONS: Therapeutic exercises, Aquatic therapy, Therapeutic activity, Neuro Muscular re-education, Gait training, Patient/Family education, Joint mobilization, Dry Needling, Electrical stimulation, Spinal mobilization and/or manipulation, Moist heat, Taping, Vasopneumatic device, Ionotophoresis 39m/ml Dexamethasone, and Manual therapy ?  ?PLAN FOR NEXT SESSION: TDN PRN, progressive calf strengthening, balance, dynamic movements as appropriate  ? ? ? ?KMathis DadPT ?06/01/2021, 10:43 AM ? ?  ? ?

## 2021-06-09 NOTE — Progress Notes (Unsigned)
Cardiology Office Note:    Date:  06/09/2021   ID:  Jacob [name], DOB 10-Jul-1980, MRN [id number]  PCP:  Janith Lima, MD   [facility] Providers Cardiologist:  None {   Referring MD: Janith Lima, MD    History of Present Illness:    Jacob [name] is a 41 y.o. male with family history of CAD and HLD who was previously followed by Dr. Meda [name] who presents to clinic for follow-up.  Per review of the record, the patient's father has a extensive history of coronary stenting with first stent in his 53s despite living a very active lifestyle and never using tobacco. His brother recently had a cholesterol panel and TG were in the 300s. Fortunately, the patient has no known history of CAD. Ca score in 02/2019 which was 0. TTE 02/2019 showed LVEF 60-65% with GLS -23.5%, normal RV, no significant valve disease.  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     Allergies:   Patient has no known allergies.   Social History   Socioeconomic History   Marital status: Married    Spouse name: Not on file   Number of children: 2   Years of education: Not on file   Highest education level: Not on file  Occupational History   Occupation: VP operations    Employer: [employer]  Tobacco Use   Smoking status: Former    Types: Cigarettes    Quit date: 03/28/2008    Years since quitting: 13.2   Smokeless tobacco: Never   Tobacco comments:    using lozenges  Vaping Use    Vaping Use: Never used  Substance and Sexual Activity   Alcohol use: No   Drug use: No   Sexual activity: Not on file  Other Topics Concern   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  ROS:   Please see the history of present illness.    *** All other systems reviewed and are negative.  EKGs/Labs/Other Studies Reviewed:    The following studies were reviewed today: ***  EKG:  EKG is *** ordered today.  The ekg ordered today demonstrates ***  Recent Labs: 01/10/2021: ALT 29; BUN 10; Creatinine, Ser 0.98; Hemoglobin 15.3; Platelets 198.0;  Potassium 3.9; Sodium 139; TSH 0.03  Recent Lipid Panel    Component Value Date/Time   CHOL 157 01/10/2021 1201   TRIG 93.0 01/10/2021 1201   HDL 53.70 01/10/2021 1201   CHOLHDL 3 01/10/2021 1201   VLDL 18.6 01/10/2021 1201   LDLCALC 84 01/10/2021 1201     Risk Assessment/Calculations:   {Does this patient have ATRIAL FIBRILLATION?:[phone]}       Physical Exam:    VS:  There were no vitals taken for this visit.    Wt Readings from Last 3 Encounters:  04/17/21 197 lb 3.2 oz (89.4 kg)  02/26/21 197 lb 9.6 oz (89.6 kg)  01/09/21 196 lb (88.9 kg)     GEN: *** Well nourished, well developed in no acute distress HEENT: Normal NECK: No JVD; No carotid bruits LYMPHATICS: No lymphadenopathy CARDIAC:  ***RRR, no murmurs, rubs, gallops RESPIRATORY:  Clear to auscultation without rales, wheezing or rhonchi  ABDOMEN: Soft, non-tender, non-distended MUSCULOSKELETAL:  No edema; No deformity  SKIN: Warm and dry NEUROLOGIC:  Alert and oriented x 3 PSYCHIATRIC:  Normal affect   ASSESSMENT:    No diagnosis found. PLAN:    In order of problems listed above:  #Family History of CAD: Father with extensive history of CAD with first PCI in his 53s. Patient's Ca score in 2021 was 0. TTE with normal biventricular function and no valve disease.  -Check NMR panel for risk stratification -Will plan to repeat Ca score in 2026 unless clinical change -Continue healthy diet and lifestyle modifications      {Are you ordering a CV Procedure (e.g. stress test, cath, DCCV, TEE, etc)?   Press F2        :[id number]}    Medication Adjustments/Labs and Tests Ordered: Current medicines are reviewed at length with the patient today.  Concerns regarding medicines are outlined above.  No orders of the defined types were placed in this encounter.  No orders of the defined types were placed in this encounter.   There are no Patient Instructions on file for this visit.   Signed, Freada Bergeron, MD  06/09/2021 3:14 PM    [place]

## 2021-06-13 ENCOUNTER — Encounter: Payer: Self-pay | Admitting: Family Medicine

## 2021-06-13 ENCOUNTER — Encounter: Payer: Self-pay | Admitting: Cardiology

## 2021-06-13 ENCOUNTER — Ambulatory Visit (INDEPENDENT_AMBULATORY_CARE_PROVIDER_SITE_OTHER): Payer: BC Managed Care – PPO | Admitting: Cardiology

## 2021-06-13 VITALS — BP 122/74 | HR 50 | Ht 69.0 in | Wt 198.4 lb

## 2021-06-13 DIAGNOSIS — Z8249 Family history of ischemic heart disease and other diseases of the circulatory system: Secondary | ICD-10-CM

## 2021-06-13 DIAGNOSIS — Z79899 Other long term (current) drug therapy: Secondary | ICD-10-CM | POA: Diagnosis not present

## 2021-06-13 DIAGNOSIS — Z8342 Family history of familial hypercholesterolemia: Secondary | ICD-10-CM

## 2021-06-13 DIAGNOSIS — I10 Essential (primary) hypertension: Secondary | ICD-10-CM | POA: Diagnosis not present

## 2021-06-13 NOTE — Patient Instructions (Signed)
Medication Instructions:   Your physician recommends that you continue on your current medications as directed. Please refer to the Current Medication list given to you today.  *If you need a refill on your cardiac medications before your next appointment, please call your pharmacy*   Lab Work:  TODAY--NMR LIPOPROFILE, [place] A   If you have labs (blood work) drawn today and your tests are completely normal, you will receive your results only by: [place] (if you have MyChart) OR A paper copy in the mail If you have any lab test that is abnormal or we need to change your treatment, we will call you to review the results.   Follow-Up:  AS NEEDED WITH DR. Johney Frame    Important Information About Sugar

## 2021-06-13 NOTE — Progress Notes (Signed)
Cardiology Office Note:    Date:  06/13/2021   ID:  Jacob [name], DOB 1980-12-22, MRN [id number]  PCP:  Janith Lima, MD   [facility] Providers Cardiologist:  None {   Referring MD: Janith Lima, MD    History of Present Illness:    Jacob [name] is a 41 y.o. male with family history of CAD and HLD who was previously followed by Dr. Meda Coffee who presents to clinic for follow-up.  Per review of the record, the patient's father has a extensive history of coronary stenting with first stent in his 46s despite living a very active lifestyle and never using tobacco. His brother recently had a cholesterol panel and TG were in the 300s. Fortunately, the patient has no known history of CAD. Ca score in 02/2019 which was 0. TTE 02/2019 showed LVEF 60-65% with GLS -23.5%, normal RV, no significant valve disease.  Today, the patient states that he generally feels great. To stay active, he enjoys playing tennis with no anginal symptoms. Has strong family history of CAD as detailed above with his father with PCI in 37s despite being very active. Father doing well since that time with no known recurrence of symptoms.   He is a former smoker; for 3-4 years, about 1.5 ppd at his peak. He quit smoking about 12 years ago.  He denies any palpitations, chest pain, shortness of breath, or peripheral edema. No lightheadedness, headaches, syncope, orthopnea, or PND.   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 50 mg M,W,F and 25 mg T,Th,S,Su    DULoxetine (CYMBALTA) 30 MG capsule TAKE 1 CAPSULE (30 MG TOTAL) BY MOUTH DAILY. MUST KEEP APPT FOR FUTURE REFILLS   levocetirizine (XYZAL) 5 MG tablet TAKE 2 TABLETS (10 MG TOTAL) BY MOUTH AT BEDTIME.   nitroGLYCERIN (NITRODUR - DOSED IN MG/24 HR) 0.2 mg/hr patch Place 1/4 to 1/2 of a patch over affected region. Remove and replace once daily.  Slightly alter skin placement daily   pantoprazole (PROTONIX) 40 MG tablet TAKE ONE TABLET BY MOUTH 30 TO 60 MINUTES BEFORE BREAKFAST AND DINNER   UNABLE TO FIND Three grains nature thyroid     Allergies:   Patient has no known allergies.   Social History   Socioeconomic History   Marital status: Married    Spouse name: Not on file   Number of children: 2   Years of education: Not on file   Highest education level: Not on file  Occupational History   Occupation: VP operations    Employer: [employer]  Tobacco Use   Smoking status: Former    Types: Cigarettes    Quit date: 03/28/2008    Years since quitting: 13.2   Smokeless tobacco: Never   Tobacco comments:    using lozenges  Vaping Use   Vaping Use: Never used  Substance and Sexual Activity   Alcohol use: No   Drug  use: No   Sexual activity: Not on file  Other Topics Concern   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  ROS:   Review of Systems   Constitutional:  Negative for chills and fever.  HENT:  Negative for sinus pain and tinnitus.   Eyes:  Negative for discharge.  Respiratory:  Negative for cough and wheezing.   Cardiovascular:  Negative for chest pain, palpitations, orthopnea, claudication, leg swelling and PND.  Gastrointestinal:  Negative for blood in stool and nausea.  Genitourinary:  Negative for dysuria.  Musculoskeletal:  Negative for back pain.  Neurological:  Negative for tingling and weakness.  Endo/Heme/Allergies:  Does not bruise/bleed easily.  Psychiatric/Behavioral:  Negative for depression. The patient does not have insomnia.     EKGs/Labs/Other Studies Reviewed:    The following studies were reviewed today:  Echo  03/18/2019: 1. Left ventricular ejection fraction, by estimation, is 60 to 65%. Left  ventricular ejection fraction by 3D volume is 62 %. The left ventricle has  normal function. The left ventricle has no regional wall motion  abnormalities. Left ventricular diastolic   parameters were normal. The average left ventricular global longitudinal  strain is -23.5 %.   2. Right ventricular systolic function is normal. The right ventricular  size is normal. Tricuspid regurgitation signal is inadequate for assessing  PA pressure.   3. The mitral valve is grossly normal. Trivial mitral valve  regurgitation. No evidence of mitral stenosis.   4. The aortic valve is tricuspid. Aortic valve regurgitation is not  visualized. No aortic stenosis is present.   5. The inferior vena cava is normal in size with greater than 50%  respiratory variability, suggesting right atrial pressure of 3 mmHg.   Conclusion(s)/Recommendation(s): Normal biventricular function without  evidence of hemodynamically significant valvular heart disease.  CT Coronary Calcium Score 03/18/2019: FINDINGS: Non-cardiac: See separate report from [facility].   Ascending Aorta: Normal size, no calcifications.    Pericardium: Normal.   Coronary arteries: Normal origin.   IMPRESSION: Coronary calcium score of 0. This was 0 percentile for age and sex matched control.    EKG:  EKG is personally reviewed. 06/13/2021:  Sinus bradycardia. Rate 50 bpm.   Recent Labs: 01/10/2021: ALT 29; BUN 10; Creatinine, Ser 0.98; Hemoglobin 15.3; Platelets 198.0; Potassium 3.9; Sodium 139; TSH 0.03   Recent Lipid Panel    Component Value Date/Time   CHOL 157 01/10/2021 1201   TRIG 93.0 01/10/2021 1201   HDL 53.70 01/10/2021 1201   CHOLHDL 3 01/10/2021 1201   VLDL 18.6 01/10/2021 1201   LDLCALC 84 01/10/2021 1201     Risk Assessment/Calculations:           Physical Exam:    VS:  BP 122/74   Pulse (!) 50   Ht '5\' 9"'$  (1.753 m)   Wt 198  lb 6.4 oz (90 kg)   SpO2 97%   BMI 29.30 kg/m     Wt Readings from Last 3 Encounters:  06/13/21 198 lb 6.4 oz (90 kg)  04/17/21 197 lb 3.2 oz (89.4 kg)  02/26/21 197 lb 9.6 oz (89.6 kg)     GEN: Well nourished, well developed in no acute distress HEENT: Normal NECK: No JVD; No carotid bruits CARDIAC: RRR, no murmurs, rubs, gallops RESPIRATORY:  Clear to auscultation without rales, wheezing or rhonchi  ABDOMEN: Soft, non-tender, non-distended MUSCULOSKELETAL:  No edema; No deformity  SKIN: Warm and dry NEUROLOGIC:  Alert and oriented x 3 PSYCHIATRIC:  Normal affect   ASSESSMENT:    1. Family history of early CAD   2. Family history of high cholesterol   3. Medication management    PLAN:    In order of problems listed above:  #Family History of CAD: Father with extensive history of CAD with first PCI in his 68s. Patient's Ca score in 2021 was 0. TTE with normal biventricular function and no valve disease.  -Check NMR panel for risk stratification -Will plan to repeat Ca score in 2026 unless clinical change -Continue healthy diet and lifestyle modifications         Follow-up:  PRN.  Medication Adjustments/Labs and Tests Ordered: Current  medicines are reviewed at length with the patient today.  Concerns regarding medicines are outlined above.   Orders Placed This Encounter  Procedures   NMR, lipoprofile   Apolipoprotein B   Lipoprotein A (LPA)   EKG 12-Lead   No orders of the defined types were placed in this encounter.  Patient Instructions  Medication Instructions:   Your physician recommends that you continue on your current medications as directed. Please refer to the Current Medication list given to you today.  *If you need a refill on your cardiac medications before your next appointment, please call your pharmacy*   Lab Work:  TODAY--NMR LIPOPROFILE, [place] A   If you have labs (blood work) drawn today and your tests are completely normal, you will receive your results only by: [place] (if you have MyChart) OR A paper copy in the mail If you have any lab test that is abnormal or we need to change your treatment, we will call you to review the results.   Follow-Up:  AS NEEDED WITH DR. Johney Frame    Important Information About Sugar         I,Mathew Stumpf,acting as a scribe for Freada Bergeron, MD.,have documented all relevant documentation on the behalf of Freada Bergeron, MD,as directed by  Freada Bergeron, MD while in the presence of Freada Bergeron, MD.  I, Freada Bergeron, MD, have reviewed all documentation for this visit. The documentation on 06/13/21 for the exam, diagnosis, procedures, and orders are all accurate and complete.   Signed, Freada Bergeron, MD  06/13/2021 8:59 AM    [facility]

## 2021-06-14 ENCOUNTER — Other Ambulatory Visit: Payer: Self-pay | Admitting: Cardiology

## 2021-06-14 LAB — NMR, LIPOPROFILE
Cholesterol, Total: 172 mg/dL (ref 100–199)
HDL Particle Number: 38.4 umol/L (ref >=30.5)
HDL-C: 67 mg/dL (ref >39)
LDL Particle Number: 1018 nmol/L — ABNORMAL HIGH (ref <1000)
LDL Size: 20.7 nm (ref >20.5)
LDL-C (NIH Calc): 94 mg/dL (ref 0–99)
LP-IR Score: <25 (ref <=45)
Small LDL Particle Number: 418 nmol/L (ref <=527)
Triglycerides: 58 mg/dL (ref 0–149)

## 2021-06-14 LAB — APOLIPOPROTEIN B: Apolipoprotein B: 75 mg/dL (ref <90)

## 2021-06-14 LAB — LIPOPROTEIN A (LPA): Lipoprotein (a): 192.9 nmol/L — ABNORMAL HIGH (ref <75.0)

## 2021-06-14 MED ORDER — ROSUVASTATIN CALCIUM 5 MG PO TABS: 5.0000 mg | ORAL_TABLET | Freq: Every day | ORAL | 3 refills | Status: DC

## 2021-06-15 ENCOUNTER — Telehealth: Payer: Self-pay | Admitting: *Deleted

## 2021-06-15 ENCOUNTER — Encounter: Payer: Self-pay | Admitting: *Deleted

## 2021-06-15 DIAGNOSIS — Z79899 Other long term (current) drug therapy: Secondary | ICD-10-CM

## 2021-06-15 DIAGNOSIS — Z8342 Family history of familial hypercholesterolemia: Secondary | ICD-10-CM

## 2021-06-15 DIAGNOSIS — E785 Hyperlipidemia, unspecified: Secondary | ICD-10-CM

## 2021-06-15 DIAGNOSIS — Z8249 Family history of ischemic heart disease and other diseases of the circulatory system: Secondary | ICD-10-CM

## 2021-06-15 NOTE — Telephone Encounter (Signed)
-----   Message from Freada Bergeron, MD sent at 06/14/2021  9:00 PM EDT ----- Called and spoke to the patient about the results. Recommended starting crestor '5mg'$  daily and repeating lipids in 6-8 weeks to ensure LDL<70.

## 2021-06-15 NOTE — Telephone Encounter (Signed)
Dr. Johney Frame already spoke with this pt about his labs results and plan. Dr. Johney Frame sent in his new statin crestor 5 mg po daily to his pharmacy of choice.  Pt just needs a lab appt.  Will reach out to the pt via mychart message to inquire when he would like to have his lipids rechecked in 6-8 weeks.  Will make him aware to come fasting.

## 2021-06-17 ENCOUNTER — Other Ambulatory Visit: Payer: Self-pay | Admitting: Internal Medicine

## 2021-06-17 DIAGNOSIS — F418 Other specified anxiety disorders: Secondary | ICD-10-CM

## 2021-06-19 ENCOUNTER — Ambulatory Visit: Payer: BC Managed Care – PPO | Admitting: Family Medicine

## 2021-06-20 ENCOUNTER — Ambulatory Visit (INDEPENDENT_AMBULATORY_CARE_PROVIDER_SITE_OTHER): Payer: Self-pay | Admitting: Plastic Surgery

## 2021-06-20 DIAGNOSIS — Z411 Encounter for cosmetic surgery: Secondary | ICD-10-CM

## 2021-06-20 NOTE — Progress Notes (Signed)
Patient presents to discuss aesthetic concerns regarding his forehead.  He is bothered by the static and dynamic lines.  These were present on exam.  We reviewed the risks and benefits of neuromodulator treatment and he is interested in moving forward.  16 units of Botox were drawn up.  Forehead was prepped with an alcohol pad.  This was distributed in a standard injection pattern with 2 lines.  He tolerated this fine.  We will plan to see him at his next visit or as needed for any touchups.  All of his questions were answered.

## 2021-07-02 ENCOUNTER — Other Ambulatory Visit: Payer: Self-pay | Admitting: Cardiology

## 2021-07-02 MED ORDER — ATORVASTATIN CALCIUM 10 MG PO TABS
10.0000 mg | ORAL_TABLET | Freq: Every day | ORAL | 3 refills | Status: DC
Start: 2021-07-02 — End: 2021-08-03

## 2021-07-07 IMAGING — DX DG WRIST COMPLETE 3+V*L*
4 series · 4 of 4 positions shown · non-contrast
Comparison: None.

CLINICAL DATA: Chronic left wrist pain near the ulnar styloid.

EXAM:
LEFT WRIST - COMPLETE 3+ VIEW

[wrist ap]
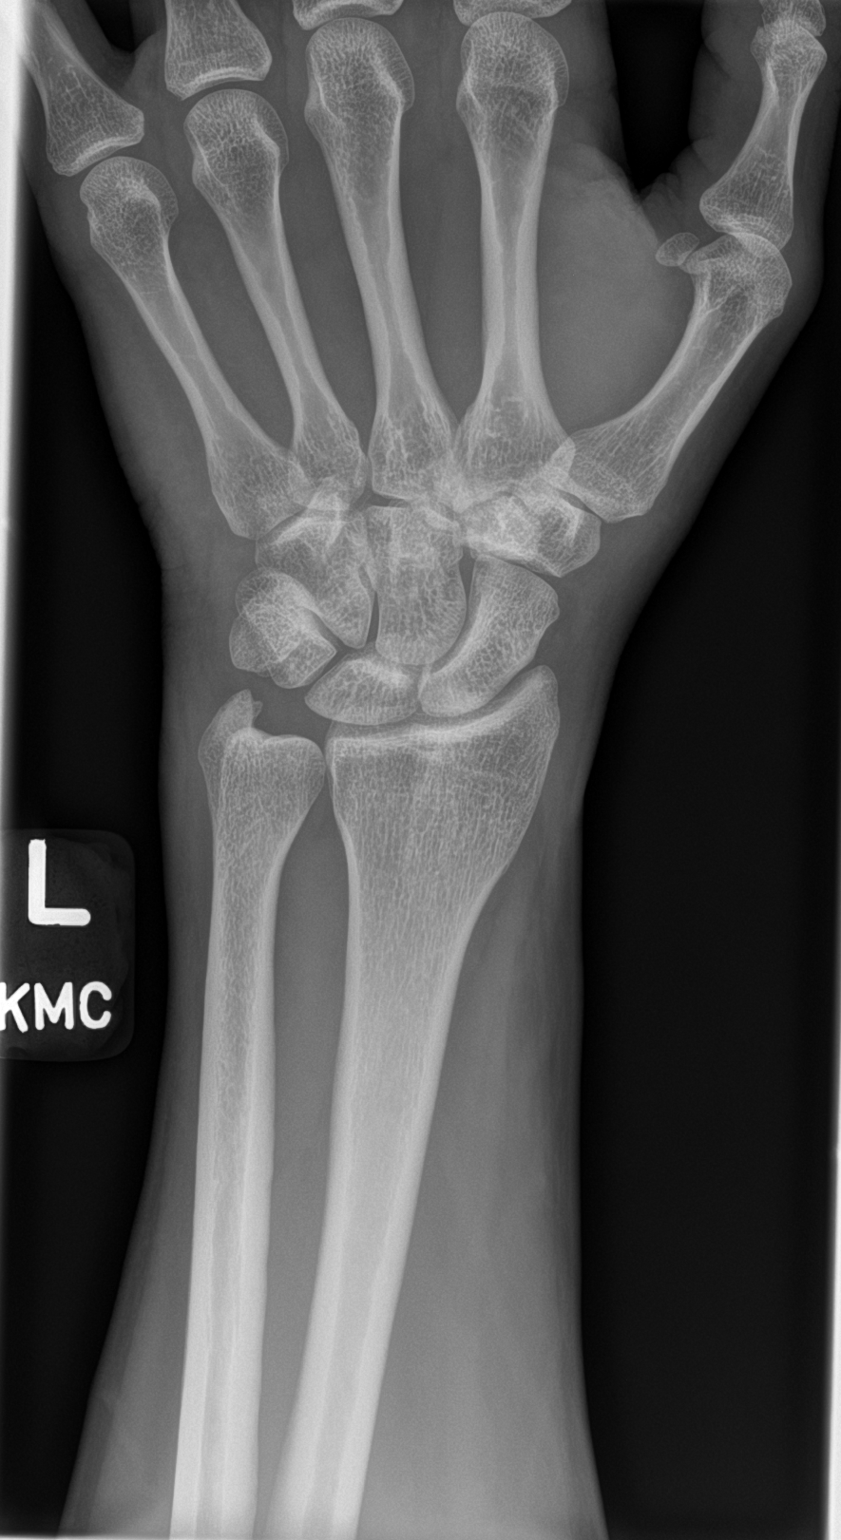

[wrist obl]
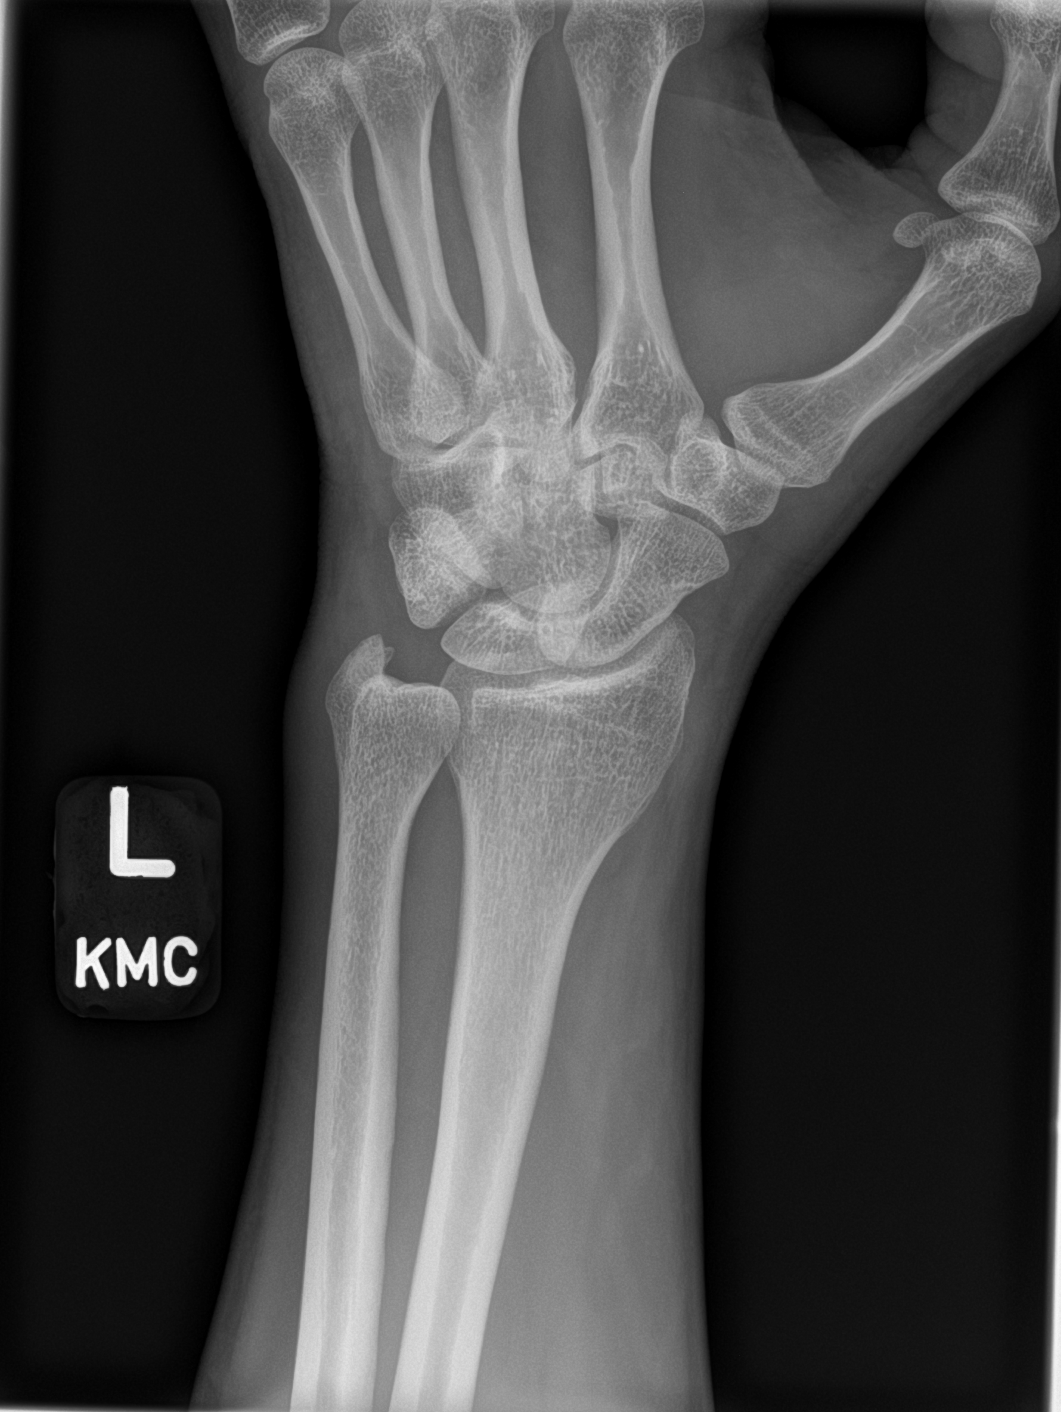

[wrist lat]
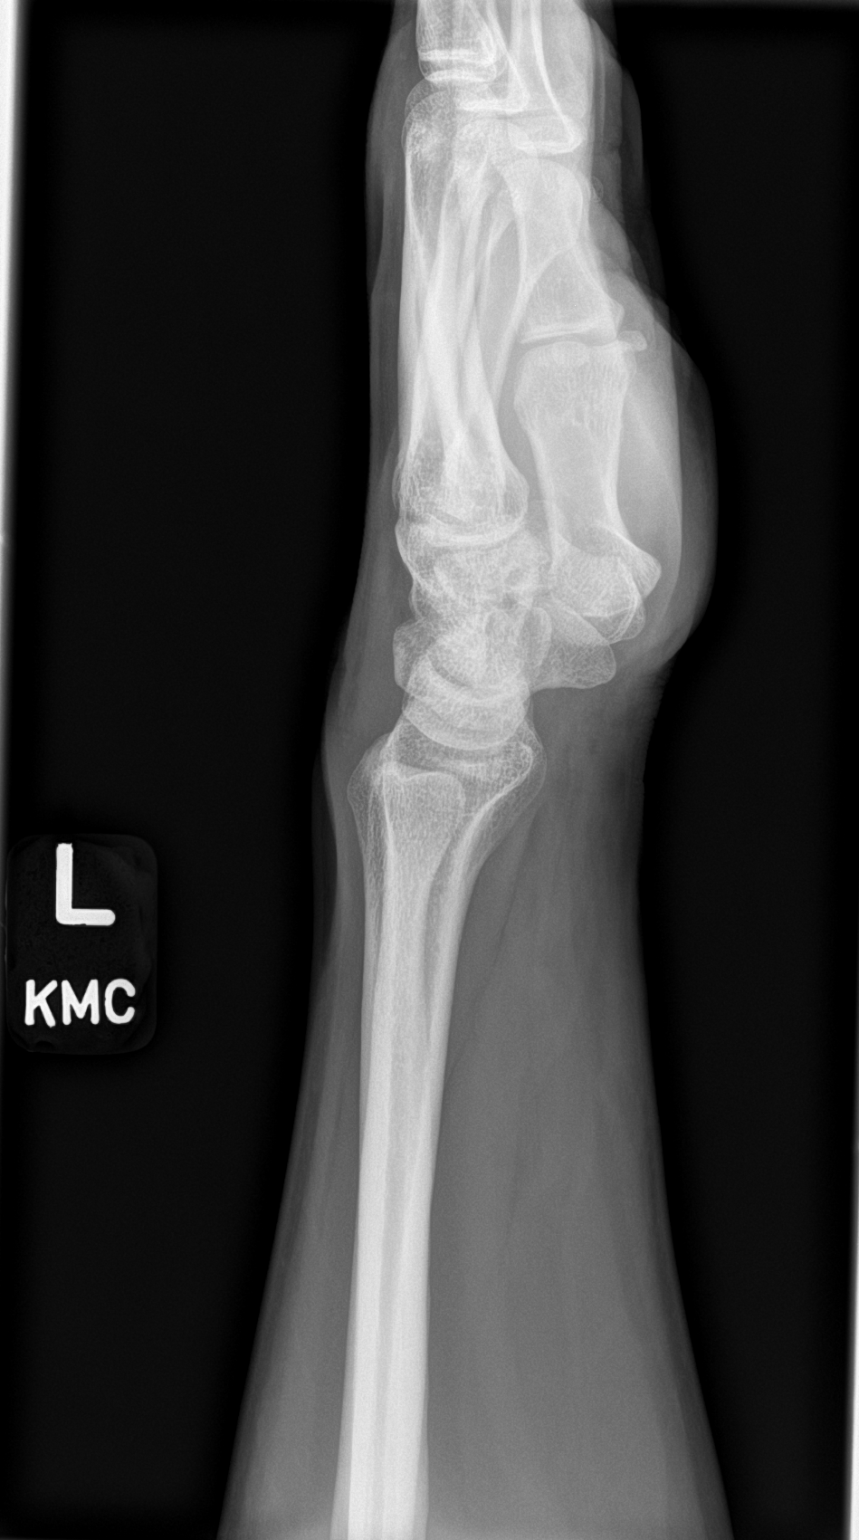

[scaphoid]
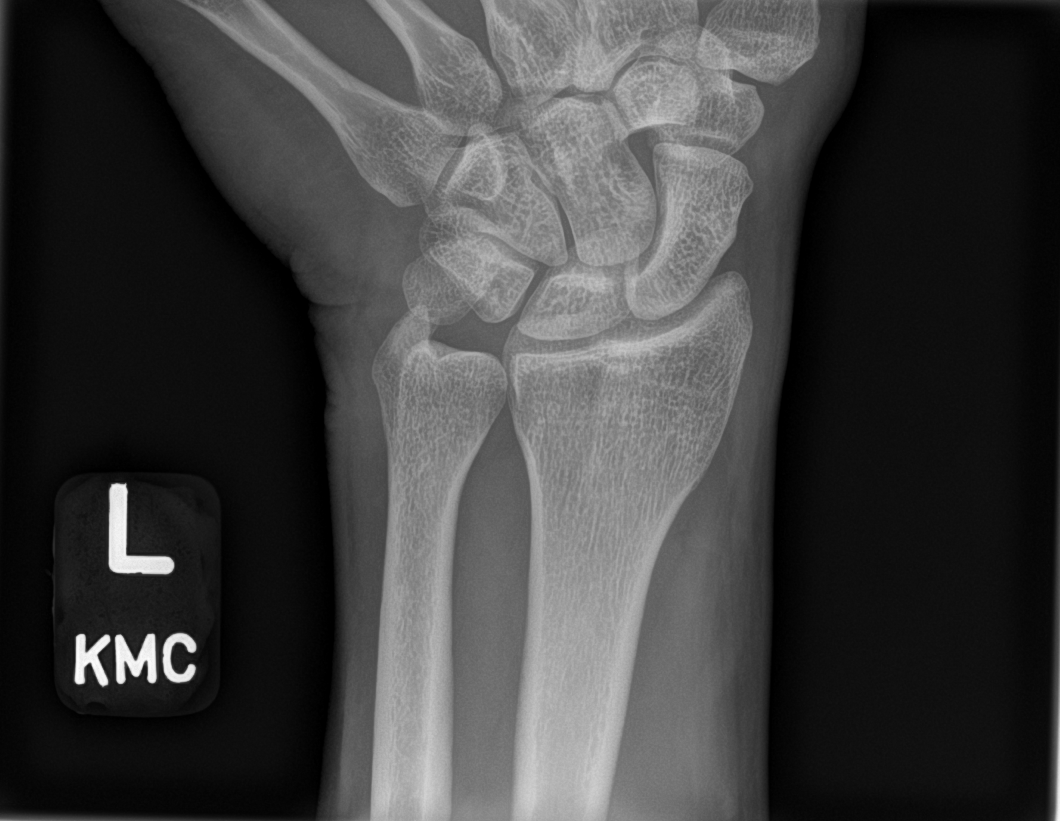

[4 of 4 positions shown; findings below may reference images not displayed]

FINDINGS: There is no evidence of fracture or dislocation. There is no
evidence of arthropathy or other focal bone abnormality. Soft
tissues are unremarkable.
IMPRESSION: Negative.

## 2021-07-09 ENCOUNTER — Encounter: Payer: Self-pay | Admitting: Cardiology

## 2021-07-13 ENCOUNTER — Other Ambulatory Visit: Payer: Self-pay | Admitting: *Deleted

## 2021-07-13 ENCOUNTER — Ambulatory Visit: Payer: BC Managed Care – PPO | Admitting: Nurse Practitioner

## 2021-07-13 DIAGNOSIS — Z79899 Other long term (current) drug therapy: Secondary | ICD-10-CM

## 2021-07-13 DIAGNOSIS — I10 Essential (primary) hypertension: Secondary | ICD-10-CM

## 2021-07-13 DIAGNOSIS — E785 Hyperlipidemia, unspecified: Secondary | ICD-10-CM

## 2021-07-13 DIAGNOSIS — Z8249 Family history of ischemic heart disease and other diseases of the circulatory system: Secondary | ICD-10-CM

## 2021-07-13 DIAGNOSIS — Z8342 Family history of familial hypercholesterolemia: Secondary | ICD-10-CM

## 2021-07-13 NOTE — Progress Notes (Signed)
Order for hemoglobin A1C added to the pts lab appt for 7/14.  Pt made aware of plan.

## 2021-08-03 ENCOUNTER — Ambulatory Visit (INDEPENDENT_AMBULATORY_CARE_PROVIDER_SITE_OTHER): Payer: BC Managed Care – PPO | Admitting: Pharmacist

## 2021-08-03 ENCOUNTER — Other Ambulatory Visit: Payer: BC Managed Care – PPO

## 2021-08-03 DIAGNOSIS — E7841 Elevated Lipoprotein(a): Secondary | ICD-10-CM | POA: Diagnosis not present

## 2021-08-03 DIAGNOSIS — I10 Essential (primary) hypertension: Secondary | ICD-10-CM | POA: Diagnosis not present

## 2021-08-03 DIAGNOSIS — Z79899 Other long term (current) drug therapy: Secondary | ICD-10-CM

## 2021-08-03 DIAGNOSIS — Z8249 Family history of ischemic heart disease and other diseases of the circulatory system: Secondary | ICD-10-CM

## 2021-08-03 DIAGNOSIS — Z8342 Family history of familial hypercholesterolemia: Secondary | ICD-10-CM

## 2021-08-03 DIAGNOSIS — E785 Hyperlipidemia, unspecified: Secondary | ICD-10-CM | POA: Diagnosis not present

## 2021-08-03 NOTE — Progress Notes (Signed)
Patient ID: VERL KITSON                 DOB: [birth year]                    MRN: [id number]     HPI: Jacob [name] is a 41 y.o. male patient referred to lipid clinic by Dr Johney Frame. PMH is significant for FHx of CAD and HLD. Calcium score was 0 in 02/2019. TTE 02/2019 showed EF 60-65%. Underwent advanced lipid panel testing in May, Lp(a) elevated at 193. He was started on rosuvastatin '5mg'$  daily.  Pt presents today for follow up. Has strong FHx of CAD. Father with extensive history of coronary stenting with first stent in his 45s despite active lifestyle and not smoking. Brother with TG in the 300s. Has been tolerating rosuvastatin '5mg'$  daily well. Didn't try the atorvastatin. 10 year ASCVD risk is 0.6%. Stays active playing tennis.  Current Medications: rosuvastatin '5mg'$  daily Risk Factors: elevated Lp(a), FHx premature CAD LDL goal: '70mg'$ /dL  Family History: Diabetes (age of onset: 43) in his sister; Heart disease (age of onset: 80) in his father; Hyperlipidemia in his father; Hypertension in his father.  Social History: Former smoker for 3-4 years, 1.5 PPD at his peak, quit about 12 years ago.  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 30 tablet 3   clomiPHENE (CLOMID) 50 MG tablet Take 50 mg by mouth. 50 mg M,W,F and 25 mg T,Th,S,Su     DULoxetine (CYMBALTA) 30 MG capsule TAKE 1 CAPSULE (30 MG TOTAL) BY MOUTH DAILY. MUST KEEP APPT FOR FUTURE REFILLS 90 capsule 1    levocetirizine (XYZAL) 5 MG tablet TAKE 2 TABLETS (10 MG TOTAL) BY MOUTH AT BEDTIME. 180 tablet 1   nitroGLYCERIN (NITRODUR - DOSED IN MG/24 HR) 0.2 mg/hr patch Place 1/4 to 1/2 of a patch over affected region. Remove and replace once daily.  Slightly alter skin placement daily 30 patch 1   pantoprazole (PROTONIX) 40 MG tablet TAKE ONE TABLET BY MOUTH 30 TO 60 MINUTES BEFORE BREAKFAST AND DINNER 180 tablet 1   UNABLE TO FIND Three grains nature thyroid     No current facility-administered medications on file prior to visit.    No Known Allergies  Assessment/Plan:  1. Hyperlipidemia - Baseline LDL 94 above goal < 70 due to elevated Lp(a) and FHx premature CAD. ApoB and LDL-P both normal. Rechecking lipids today since pt has started rosuvastatin '5mg'$  daily and can titrate dose as needed, addition of ezetimibe also an option. Pt wants to be as proactive as possible to prevent future CVD. Discussed PCSK9i given his elevated Lp(a). His insurance unfortunately does not cover PCSK9i for primary prevention unless calcium score is > 300, LDL is > 220 at baseline, or 10 year ASCVD risk is > 20%. Current trials ongoing for Lp(a) meds so  likely will have options in the coming years to target this. In the mean time, will treat LDL to more aggressive goal and plan for calcium scoring again in 2026.  Iqra Rotundo E. Percilla Tweten, PharmD, BCACP, [place] [address] [place], [place] [address] Phone: [phone]; Fax: [phone] 08/03/2021 9:33 AM

## 2021-08-04 ENCOUNTER — Encounter: Payer: Self-pay | Admitting: Family Medicine

## 2021-08-04 DIAGNOSIS — M25551 Pain in right hip: Secondary | ICD-10-CM

## 2021-08-04 LAB — LIPID PANEL
Chol/HDL Ratio: 2.5 ratio (ref 0.0–5.0)
Cholesterol, Total: 135 mg/dL (ref 100–199)
HDL: 55 mg/dL (ref >39)
LDL Chol Calc (NIH): 68 mg/dL (ref 0–99)
Triglycerides: 58 mg/dL (ref 0–149)
VLDL Cholesterol Cal: 12 mg/dL (ref 5–40)

## 2021-08-04 LAB — HEMOGLOBIN A1C
Est. average glucose Bld gHb Est-mCnc: 108 mg/dL
Hgb A1c MFr Bld: 5.4 % (ref 4.8–5.6)

## 2021-08-06 DIAGNOSIS — L719 Rosacea, unspecified: Secondary | ICD-10-CM | POA: Diagnosis not present

## 2021-08-07 ENCOUNTER — Other Ambulatory Visit: Payer: Self-pay | Admitting: Cardiology

## 2021-08-07 MED ORDER — ROSUVASTATIN CALCIUM 10 MG PO TABS: 10.0000 mg | ORAL_TABLET | Freq: Every day | ORAL | 3 refills | Status: DC

## 2021-08-10 DIAGNOSIS — Z1231 Encounter for screening mammogram for malignant neoplasm of breast: Secondary | ICD-10-CM | POA: Diagnosis not present

## 2021-08-16 ENCOUNTER — Ambulatory Visit (INDEPENDENT_AMBULATORY_CARE_PROVIDER_SITE_OTHER): Payer: BC Managed Care – PPO | Admitting: Orthopaedic Surgery

## 2021-08-16 DIAGNOSIS — M25551 Pain in right hip: Secondary | ICD-10-CM | POA: Diagnosis not present

## 2021-08-16 DIAGNOSIS — S73191A Other sprain of right hip, initial encounter: Secondary | ICD-10-CM | POA: Diagnosis not present

## 2021-08-16 DIAGNOSIS — G8929 Other chronic pain: Secondary | ICD-10-CM | POA: Diagnosis not present

## 2021-08-16 MED ORDER — TRIAMCINOLONE ACETONIDE 40 MG/ML IJ SUSP
80.0000 mg | INTRAMUSCULAR | Status: AC | PRN
  Administered 2021-08-16: 80 mg via INTRA_ARTICULAR

## 2021-08-16 MED ORDER — LIDOCAINE HCL 1 % IJ SOLN
4.0000 mL | INTRAMUSCULAR | Status: AC | PRN
  Administered 2021-08-16: 4 mL

## 2021-08-16 NOTE — Progress Notes (Signed)
Chief Complaint: Right hip pain     History of Present Illness:    Jacob [name] is a 41 y.o. male presents today with 1 years of right hip pain.  He states that the hip pain is experienced predominantly medially in the groin.  He has not previously had any physical therapy or injections.  He does take ibuprofen for this.  He has a avid Firefighter and he states that whenever he plays a singles match competitively he will experience burning pain in the hip for the next 2 to 3 days.  He does enjoy working out and staying active but this is also been limited as result of his hip pain.  He is here today as a referral from Dr. Georgina Snell for further assessment.    Surgical History:   None  PMH/PSH/Family History/Social History/Meds/Allergies:    Past Medical History:  Diagnosis Date  . Anxiety   . 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 Depression   . GERD (gastroesophageal reflux disease)    w/ HH  . H/O alcohol abuse    sober since 02/04/2007  . Hypothyroid    Past Surgical History:  Procedure Laterality Date  . WRIST SURGERY Left    Social History   Socioeconomic History  . Marital status: Married    Spouse name: Not on file  . Number of children: 2  . Years of education: Not on file  . Highest education level: Not on file  Occupational History  . Occupation: VP Location manager: [employer]  Tobacco Use  . Smoking status: Former    Types: Cigarettes    Quit date: 03/28/2008    Years since quitting: 13.3  . Smokeless tobacco: Never  . Tobacco comments:    using lozenges  Vaping Use  . Vaping Use: Never used  Substance and Sexual Activity  . Alcohol use: No  . Drug use: No  . Sexual activity: Not on file  Other Topics Concern  . 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 Heart disease Father 75  . Hyperlipidemia Father   . Hypertension Father   . Diabetes Sister 72   No Known Allergies Current Outpatient Medications  Medication Sig Dispense Refill  . clomiPHENE (CLOMID) 50 MG tablet Take 50 mg by mouth. 50 mg M,W,F and 25 mg T,Th,S,Su    . DULoxetine (CYMBALTA) 30 MG capsule TAKE 1 CAPSULE (30 MG TOTAL) BY MOUTH DAILY. MUST KEEP APPT FOR FUTURE REFILLS 90 capsule 1  . levocetirizine (XYZAL) 5 MG tablet TAKE 2 TABLETS (10 MG TOTAL) BY MOUTH AT BEDTIME. 180 tablet 1  . pantoprazole (PROTONIX) 40 MG tablet TAKE ONE TABLET BY MOUTH 30 TO 60  MINUTES BEFORE BREAKFAST AND DINNER 180 tablet 1  . rosuvastatin (CRESTOR) 10 MG tablet Take 1 tablet (10 mg total) by mouth daily. 90 tablet 3  . UNABLE TO FIND Three grains nature thyroid     No current facility-administered medications for this visit.   No results found.  Review of Systems:   A ROS was performed including pertinent positives and negatives as documented in the HPI.  Physical Exam :   Constitutional: NAD and appears stated age Neurological: Alert and oriented Psych: Appropriate affect and cooperative There were no vitals taken for this visit.   Comprehensive Musculoskeletal Exam:    Inspection Right Left  Skin No atrophy or gross abnormalities appreciated No atrophy or gross abnormalities appreciated  Palpation    Tenderness None None  Crepitus None None  Range of Motion    Flexion (passive) 120 120  Extension 30 30  IR 30 with pain 30  ER 45 45  Strength    Flexion  5/5 5/5   Extension 5/5 5/5  Special Tests    FABER Negative Negative  FADIR Positive Negative  ER Lag/Capsular Insufficiency Negative Negative  Instability Negative Negative  Sacroiliac pain Negative  Negative   Instability    Generalized Laxity No No  Neurologic    sciatic, femoral, obturator nerves intact to light sensation  Vascular/Lymphatic    DP pulse 2+ 2+  Lumbar Exam    Patient has symmetric lumbar range of motion with negative pain referral to hip     Imaging:   Xray (AP pelvis 3 views right hip): Very small os acetabuli otherwise normal  MRI (right hip): There is an anterior-inferior superior labral tear  I personally reviewed and interpreted the radiographs.   Assessment:   41 y.o. male with evidence of right hip labral tear and likely resulting instability.  I did discuss that at this time I do believe that physical therapy would help benefit in terms of strengthening his gluteus in order to maximize his hip and hopefully provide him symptomatic relief.  I do believe that an ultrasound-guided hip injection would help to work through this as well.  I will plan to perform this today.  He will work on strengthening of the gluteus muscles on the right hip as well as core plan to see him back in 6 weeks for reassessment.  We did briefly discuss the role for operative management as well and understands that this might enter the patient should he not be able to compensate well with physical therapy.  Plan :    -Return to clinic in 6 weeks -Right hip ultrasound-guided injection performed today after verbal consent obtained    Procedure Note  Patient: Jacob [name]             Date of Birth: February 17, 1980           MRN: [id number]             Visit Date: 08/16/2021  Procedures: Visit Diagnoses:  1. Chronic right hip pain     Large Joint Inj: R hip joint on 08/16/2021 12:08 PM Indications: pain Details: 22 G 3.5 in needle, ultrasound-guided anterolateral  approach  Arthrogram: No  Medications: 4 mL lidocaine 1 %; 80 mg triamcinolone acetonide 40 MG/ML Outcome: tolerated well, no immediate complications Procedure, treatment alternatives, risks and benefits explained, specific risks discussed. Consent was given by the patient. Immediately prior to procedure a time out was called to verify the correct patient, procedure, equipment, support  staff and site/side marked as required. Patient was prepped and draped in the usual sterile fashion.        I personally saw and evaluated the patient, and participated in the management and treatment plan.  Vanetta Mulders, MD Attending Physician, Orthopedic Surgery  This document was dictated using Dragon voice recognition software. A reasonable attempt at proof reading has been made to minimize errors.

## 2021-08-21 ENCOUNTER — Ambulatory Visit (HOSPITAL_BASED_OUTPATIENT_CLINIC_OR_DEPARTMENT_OTHER): Payer: BC Managed Care – PPO | Attending: Orthopaedic Surgery | Admitting: Physical Therapy

## 2021-08-21 ENCOUNTER — Encounter (HOSPITAL_BASED_OUTPATIENT_CLINIC_OR_DEPARTMENT_OTHER): Payer: Self-pay | Admitting: Physical Therapy

## 2021-08-21 DIAGNOSIS — G8929 Other chronic pain: Secondary | ICD-10-CM | POA: Diagnosis not present

## 2021-08-21 DIAGNOSIS — R2689 Other abnormalities of gait and mobility: Secondary | ICD-10-CM | POA: Insufficient documentation

## 2021-08-21 DIAGNOSIS — M25551 Pain in right hip: Secondary | ICD-10-CM | POA: Insufficient documentation

## 2021-08-21 NOTE — Therapy (Signed)
OUTPATIENT PHYSICAL THERAPY LOWER EXTREMITY EVALUATION   Patient Name: Jacob [name] MRN: [id number] DOB:03-13-1980, 41 y.o., male Today's Date: 08/22/2021   PT End of Session - 08/22/21 0821     Visit Number 1    Number of Visits 6    Date for PT Re-Evaluation 07/13/21    Authorization Type BCBS    PT Start Time 1645    PT Stop Time 1735    PT Time Calculation (min) 50 min    Activity Tolerance Patient tolerated treatment well    Behavior During Therapy [facility] for tasks assessed/performed             Past Medical History:  Diagnosis Date   Anxiety    Community acquired MRSA infection    recurrent, 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 - follows with [facility], neg cx 07/2013   Depression    GERD (gastroesophageal reflux disease)    w/ HH   H/O alcohol abuse    sober since 02/04/2007   Hypothyroid    Past Surgical History:  Procedure Laterality Date   WRIST SURGERY Left    Patient Active Problem List   Diagnosis Date Noted   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   Constipation 01/29/2021   Rotator cuff tendinitis, right 01/18/2021   Chronic right hip pain 05/29/2020   Essential hypertension 12/10/2019   Tinea pedis of both feet 12/03/2019   Biceps tendinitis of right upper extremity 02/05/2019   Lateral epicondylitis of right elbow 05/01/2017   Family history of brain aneurysm 11/05/2016   Renal cyst, left 03/24/2016   Routine general medical examination at a health care facility 11/22/2015   Hypothyroid     PCP: Scarlette Calico MD  REFERRING PROVIDER: Dr Donivan Scull  REFERRING DIAG: right hip pain   THERAPY DIAG:  Pain in right hip  Other abnormalities of gait and mobility  Rationale for Evaluation and Treatment Rehabilitation  ONSET DATE:   SUBJECTIVE:   SUBJECTIVE STATEMENT:  For the past year and a half the patient has had increased pain in his anterior right hip while playing tennis. The pain comes and goes, but has been more limited over the past few months.  He had an MRI which showed a labral tear in his anterior labrum. He had an injection which helped significantly. He likes to play tennis, jog, and work out.    PERTINENT HISTORY: Anxiety ; left wrist surgery;      PAIN:  Are you having pain? Yes: NPRS scale: 3/10 no significant pain today but when he feels it it can get up to a 5-6/10 Pain location: anterior medial hip  Pain description: aching  Aggravating factors: running; twisting  Relieving factors: not doing those activity's   PRECAUTIONS: None  WEIGHT BEARING RESTRICTIONS No  FALLS:  Has patient fallen in last 6 months? No  LIVING ENVIRONMENT: Nothing pertinent   OCCUPATION: works in Marketing executive   PLOF: Independent  Hobbies: lifting weights, tennis,   PATIENT GOALS  To be able to keep running and playing tennis   OBJECTIVE:   DIAGNOSTIC FINDINGS:  MRI 05/2020 IMPRESSION: 1. Stress injury of the right parasymphyseal superior pubic ramus. 2. Fraying and degeneration of the right anterior superior labrum.   PATIENT SURVEYS:  FOTO    COGNITION:  Overall cognitive status: Within functional limits for tasks assessed     SENSATION: WFL  POSTURE: No Significant postural limitations  PALPATION: Moderate tenderness to palpation in the anterior hip   LOWER EXTREMITY ROM:  Mild motion limitations in hip ER/IR but no pain   LOWER EXTREMITY MMT:  MMT Right eval Left eval  Hip flexion 44.5 49.0  Hip extension    Hip abduction 54.9 59.8  Hip adduction    Hip internal rotation    Hip external rotation    Knee flexion    Knee extension WNL WNL  Ankle dorsiflexion    Ankle plantarflexion    Ankle inversion    Ankle eversion     (Blank rows = not tested)   FUNCTIONAL TESTS:  Stability difference between on the right compared to left with SLS   Good squat technique  GAIT: Gait is normal. We will do a running analysis next visit TODAY'S TREATMENT:  Exercises - Supine Bridge  - 1 x  daily - 7 x weekly - 3 sets - 10 reps - Single Leg Bridge  - 1 x daily - 7 x weekly - 3 sets - 10 reps - Single Leg Stance with Diagonal Forward Reach  - 1 x daily - 7 x weekly - 3 sets - 10 reps - Forward Monster Walks  - 1 x daily - 7 x weekly - 3 sets - 10 reps - Side Stepping with Resistance at Thighs  - 1 x daily - 7 x weekly - 3 sets - 10 reps - Mini Lunge  - 1 x daily - 7 x weekly - 3 sets - 10 reps   PATIENT EDUCATION:  Education details: Low amplitude strengthening with progression to dynamic activity Person educated: Patient Education method: Explanation, Demonstration, Tactile cues, Verbal cues, and Handouts Education comprehension: verbalized understanding, returned demonstration, verbal cues required, tactile cues required, and needs further education   HOME EXERCISE PROGRAM: Access Code: [email or web address] URL: [email or web address] Date: 08/22/2021 Prepared by: Carolyne Littles  ASSESSMENT:  CLINICAL IMPRESSION: Patient is a 41 y.o. male who was seen today for physical therapy evaluation and treatment for right hip pain with tennis and jogging. His MRI shoes fraying of the anterior labrum. He has mild strength and motion deficits. He was given a low range strengthening program with progression of activity and strange. Her will work on it for 3 weeks. He will return and we will re-assess his program and likely do a running analysis.    OBJECTIVE IMPAIRMENTS decreased mobility, decreased strength, pain, and pain running  .   ACTIVITY LIMITATIONS lifting and tennis and jogging   PARTICIPATION LIMITATIONS:  tennis and jogging   PERSONAL FACTORS Time since onset of injury/illness/exacerbation are also affecting patient's functional outcome.   REHAB POTENTIAL: Excellent  CLINICAL DECISION MAKING: Stable/uncomplicated  EVALUATION COMPLEXITY: Low   GOALS: Goals reviewed with patient? Yes  SHORT TERM GOALS: Target date: 09/12/2021  Patient will increase right hip  flexor strength by 5 lbs  Baseline: Goal status: INITIAL  2.  Patient will demonstrate good static right hip single leg stability  Baseline:  Goal status: INITIAL  3.  Patient will be independent with base exercise program  Baseline:  Goal status: INITIAL  LONG TERM GOALS: Target date: 10/03/2021   Patient will return to jogging without pain  Baseline:  Goal status: INITIAL  2.  Patient will play tennis without pain  Baseline:  Goal status: INITIAL    PLAN: PT FREQUENCY: 1x/week  PT DURATION: 6 weeks  PLANNED INTERVENTIONS: Therapeutic exercises, Therapeutic activity, Neuromuscular re-education, Balance training, Gait training, Patient/Family education, Self Care, Joint mobilization, Stair training, Aquatic Therapy, Dry Needling, Electrical stimulation, Cryotherapy, Moist heat, Taping, Ultrasound, and Manual  therapy  PLAN FOR NEXT SESSION: assess tolerate to treatment. Add in more dynamic movement activity. If patient continues to have pain consider weekly therapy to work on dynamic therapy; add kettle bell swing; review lifting technique. Manual therapy if needed; consider needling anterior hip if the pain persists.    Carney Living, PT 08/22/2021, 12:06 PM

## 2021-08-22 ENCOUNTER — Encounter: Payer: Self-pay | Admitting: Family Medicine

## 2021-08-23 ENCOUNTER — Ambulatory Visit (INDEPENDENT_AMBULATORY_CARE_PROVIDER_SITE_OTHER): Payer: BC Managed Care – PPO | Admitting: Family Medicine

## 2021-08-23 ENCOUNTER — Ambulatory Visit: Payer: Self-pay

## 2021-08-23 VITALS — BP 148/92 | HR 69 | Ht 69.0 in | Wt 196.6 lb

## 2021-08-23 DIAGNOSIS — F17200 Nicotine dependence, unspecified, uncomplicated: Secondary | ICD-10-CM | POA: Diagnosis not present

## 2021-08-23 DIAGNOSIS — E559 Vitamin D deficiency, unspecified: Secondary | ICD-10-CM | POA: Diagnosis not present

## 2021-08-23 DIAGNOSIS — R790 Abnormal level of blood mineral: Secondary | ICD-10-CM | POA: Diagnosis not present

## 2021-08-23 DIAGNOSIS — G8929 Other chronic pain: Secondary | ICD-10-CM

## 2021-08-23 DIAGNOSIS — E291 Testicular hypofunction: Secondary | ICD-10-CM | POA: Diagnosis not present

## 2021-08-23 DIAGNOSIS — M7581 Other shoulder lesions, right shoulder: Secondary | ICD-10-CM | POA: Diagnosis not present

## 2021-08-23 DIAGNOSIS — E538 Deficiency of other specified B group vitamins: Secondary | ICD-10-CM | POA: Diagnosis not present

## 2021-08-23 DIAGNOSIS — M25511 Pain in right shoulder: Secondary | ICD-10-CM

## 2021-08-23 DIAGNOSIS — R5383 Other fatigue: Secondary | ICD-10-CM | POA: Diagnosis not present

## 2021-08-23 DIAGNOSIS — I10 Essential (primary) hypertension: Secondary | ICD-10-CM | POA: Diagnosis not present

## 2021-08-23 DIAGNOSIS — E039 Hypothyroidism, unspecified: Secondary | ICD-10-CM | POA: Diagnosis not present

## 2021-08-23 NOTE — Progress Notes (Signed)
I, [name], LAT, ATC acting as a scribe for [name], MD.  Jacob [name] is a 41 y.o. male who presents to [facility] [facility] today for R shoulder pain. Pt was previously seen by Dr. Georgina Snell on 04/17/21 L gastroc strain. Today, pt c/o R shoulder pain ongoing since Monday, 7/31, after playing tennis and doing a lot of serving. Pt has been seen for his R shoulder back in 2021 and had a R subacromial steroid injection on 12/07/19 and was referred to see Dr. Onnie Graham who performed a R shoulder debridement on 04/01/19. Pt has been playing tennis every other day. Pt locates his pain to the lateral aspect of the R shoulder  Aggravates: shoulder ABD, IR, ER (backhand and forehand motion)  Dx imaging: 11/23/19 R shoulder MRI  Pertinent review of systems: No fevers or chills  Relevant historical information: Hypertension.  Hyperlipidemia.   Exam:  BP (!) 148/92   Pulse 69   Ht '5\' 9"'$  (1.753 m)   Wt 196 lb 9.6 oz (89.2 kg)   SpO2 98%   BMI 29.03 kg/m  General: Well Developed, well nourished, and in no acute distress.   MSK: Right shoulder: Normal appearing Normal motion pain with abduction. Positive Hawkins and Neer's test.  Positive empty can test. Negative Yergason's and speeds test. Intact strength.    Lab and Radiology Results  Procedure: Real-time Ultrasound Guided Injection of right shoulder subacromial bursa Device: Philips Affiniti 50G Images permanently stored and available for review in PACS Ultrasound evaluation prior to injection reveals mild to moderate subacromial bursitis.  Intact rotator cuff tendons. Verbal informed consent obtained.  Discussed risks and benefits of procedure. Warned about infection, bleeding, hyperglycemia damage to structures among others. Patient expresses understanding and agreement Time-out conducted.   Noted no overlying erythema, induration, or other signs of local infection.   Skin prepped in a sterile  fashion.   Local anesthesia: Topical Ethyl chloride.   With sterile technique and under real time ultrasound guidance: 40 mg of Kenalog and 2 mL of Marcaine injected into right shoulder subacromial bursa. Fluid seen entering the bursa.   Completed without difficulty   Pain immediately resolved suggesting accurate placement of the medication.   Advised to call if fevers/chills, erythema, induration, drainage, or persistent bleeding.   Images permanently stored and available for review in the ultrasound unit.  Impression: Technically successful ultrasound guided injection.   EXAM: MRI OF THE RIGHT SHOULDER WITHOUT CONTRAST   TECHNIQUE: Multiplanar, multisequence MR imaging of the shoulder was performed. No intravenous contrast was administered.   COMPARISON:  None.   FINDINGS: Rotator cuff: Mild tendinosis of the supraspinatus tendon with a small partial-thickness or bursal surface tear anteriorly. Moderate tendinosis of the infraspinatus tendon. Teres minor tendon is intact. Mild tendinosis of the subscapularis tendon with a small interstitial tear.   Muscles: No muscle atrophy or edema. No intramuscular fluid collection or hematoma.   Biceps Long Head: Intraarticular and extraarticular portions of the biceps tendon are intact.   Acromioclavicular Joint: Moderate arthropathy of the acromioclavicular joint. Type II acromion. No subacromial/subdeltoid bursal fluid.   Glenohumeral Joint: No joint effusion. No chondral defect.   Labrum: Small posterior labral tear.   Bones: No fracture or dislocation. No aggressive osseous lesion.   Other: No fluid collection or hematoma.   IMPRESSION: 1. Mild tendinosis of the supraspinatus tendon with a small partial-thickness or bursal surface tear anteriorly. 2. Moderate tendinosis of the infraspinatus tendon. 3. Mild  tendinosis of the subscapularis tendon with a small interstitial tear.     Electronically Signed   By: Kathreen Devoid   On: 11/24/2019 11:24 I, [name], personally (independently) visualized and performed the interpretation of the images attached in this note.      Assessment and Plan: 41 y.o. male with right shoulder pain thought to be due to subacromial bursitis and exacerbation of prior rotator cuff tendinitis.  Plan for injection as above and home exercise program.  If not improving refer to physical therapy. Ibuprofen prescription strength if needed.  PDMP not reviewed this encounter. Orders Placed This Encounter  Procedures   [place] LIMITED JOINT SPACE STRUCTURES UP RIGHT(NO LINKED CHARGES)    Order Specific Question:   Reason for Exam (SYMPTOM  OR DIAGNOSIS REQUIRED)    Answer:   right shoulder pain    Order Specific Question:   Preferred imaging location?    Answer:   [facility]   No orders of the defined types were placed in this encounter.    Discussed warning signs or symptoms. Please see discharge instructions. Patient expresses understanding.   The above documentation has been reviewed and is accurate and complete [name], M.D.

## 2021-08-23 NOTE — Patient Instructions (Addendum)
Thank you for coming in today.   You received an injection today. Seek immediate medical attention if the joint becomes red, extremely painful, or is oozing fluid.   Please complete the exercises that the athletic trainer went over with you:  View at [email or web address] using code: [email or web address]

## 2021-09-03 DIAGNOSIS — J0100 Acute maxillary sinusitis, unspecified: Secondary | ICD-10-CM | POA: Diagnosis not present

## 2021-09-04 ENCOUNTER — Ambulatory Visit: Payer: BC Managed Care – PPO | Admitting: Emergency Medicine

## 2021-09-13 DIAGNOSIS — R790 Abnormal level of blood mineral: Secondary | ICD-10-CM | POA: Diagnosis not present

## 2021-09-13 DIAGNOSIS — R5383 Other fatigue: Secondary | ICD-10-CM | POA: Diagnosis not present

## 2021-09-13 DIAGNOSIS — E291 Testicular hypofunction: Secondary | ICD-10-CM | POA: Diagnosis not present

## 2021-09-13 DIAGNOSIS — E039 Hypothyroidism, unspecified: Secondary | ICD-10-CM | POA: Diagnosis not present

## 2021-09-17 ENCOUNTER — Ambulatory Visit (HOSPITAL_BASED_OUTPATIENT_CLINIC_OR_DEPARTMENT_OTHER): Payer: BC Managed Care – PPO | Admitting: Physical Therapy

## 2021-09-17 DIAGNOSIS — M25551 Pain in right hip: Secondary | ICD-10-CM

## 2021-09-17 DIAGNOSIS — R2689 Other abnormalities of gait and mobility: Secondary | ICD-10-CM | POA: Diagnosis not present

## 2021-09-17 DIAGNOSIS — G8929 Other chronic pain: Secondary | ICD-10-CM | POA: Diagnosis not present

## 2021-09-17 NOTE — Therapy (Addendum)
OUTPATIENT PHYSICAL THERAPY LOWER EXTREMITY EVALUATION/discharge    Patient Name: Jacob [name] MRN: [id number] DOB:October 20, 1980, 41 y.o., male Today's Date: 09/18/2021   PT End of Session - 09/18/21 0808     Visit Number 2    Number of Visits 6    Date for PT Re-Evaluation 10/03/21    Authorization Type BCBS    PT Start Time 1315    PT Stop Time 1357    PT Time Calculation (min) 42 min    Activity Tolerance Patient tolerated treatment well    Behavior During Therapy [facility] for tasks assessed/performed              Past Medical History:  Diagnosis Date   Anxiety    Community acquired MRSA infection    recurrent, 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 - follows with [facility], neg cx 07/2013   Depression    GERD (gastroesophageal reflux disease)    w/ HH   H/O alcohol abuse    sober since 02/04/2007   Hypothyroid    Past Surgical History:  Procedure Laterality Date   WRIST SURGERY Left    Patient Active Problem List   Diagnosis Date Noted   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   Constipation 01/29/2021   Rotator cuff tendinitis, right 01/18/2021   Chronic right hip pain 05/29/2020   Essential hypertension 12/10/2019   Tinea pedis of both feet 12/03/2019   Lateral epicondylitis of right elbow 05/01/2017   Family history of brain aneurysm 11/05/2016   Renal cyst, left 03/24/2016   Routine general medical examination at a health care facility 11/22/2015   Hypothyroid     PCP: Scarlette Calico MD  REFERRING PROVIDER: Dr Donivan Scull  REFERRING DIAG: right hip pain   THERAPY DIAG:  Pain in right hip  Other abnormalities of gait and mobility  Rationale for Evaluation and Treatment Rehabilitation  ONSET DATE:   SUBJECTIVE:   SUBJECTIVE STATEMENT:  The patient reports he is just having minor pain from time to time. He has been consistent and persistent with his exercises. He has not started running yet.   PERTINENT HISTORY: Anxiety ; left wrist surgery;      PAIN:   Are you having pain? Yes: NPRS scale: 3/10 no significant pain today but when he feels it it can get up to a 5-6/10 Pain location: anterior medial hip  Pain description: aching  Aggravating factors: running; twisting  Relieving factors: not doing those activity's   PRECAUTIONS: None  WEIGHT BEARING RESTRICTIONS No  FALLS:  Has patient fallen in last 6 months? No  LIVING ENVIRONMENT: Nothing pertinent   OCCUPATION: works in Marketing executive   PLOF: Independent  Hobbies: lifting weights, tennis,   PATIENT GOALS  To be able to keep running and playing tennis   OBJECTIVE:   DIAGNOSTIC FINDINGS:  MRI 05/2020 IMPRESSION: 1. Stress injury of the right parasymphyseal superior pubic ramus. 2. Fraying and degeneration of the right anterior superior labrum.   PATIENT SURVEYS:  FOTO    COGNITION:  Overall cognitive status: Within functional limits for tasks assessed     SENSATION: WFL  POSTURE: No Significant postural limitations  PALPATION: Moderate tenderness to palpation in the anterior hip   LOWER EXTREMITY ROM: Mild motion limitations in hip ER/IR but no pain   LOWER EXTREMITY MMT:  MMT Right eval Left eval  Hip flexion 44.5 49.0  Hip extension    Hip abduction 54.9 59.8  Hip adduction    Hip internal rotation  Hip external rotation    Knee flexion    Knee extension WNL WNL  Ankle dorsiflexion    Ankle plantarflexion    Ankle inversion    Ankle eversion     (Blank rows = not tested)   FUNCTIONAL TESTS:  Stability difference between on the right compared to left with SLS   Good squat technique  GAIT: Gait is normal. We will do a running analysis next visit TODAY'S TREATMENT: 8/29 Thomas stretch 3x20 sec hold   Cable walk fwd and back x10 10lbs  Pallof press 2x10 10 lbs  Chop 2x10 10 lbs   Single leg d2 2x15 2lbs   Reviewe dhow to integrate exercises into current program. Reviewed strategies to continue to progress lifting.       Eval Exercises - Supine Bridge  - 1 x daily - 7 x weekly - 3 sets - 10 reps - Single Leg Bridge  - 1 x daily - 7 x weekly - 3 sets - 10 reps - Single Leg Stance with Diagonal Forward Reach  - 1 x daily - 7 x weekly - 3 sets - 10 reps - Forward Monster Walks  - 1 x daily - 7 x weekly - 3 sets - 10 reps - Side Stepping with Resistance at Thighs  - 1 x daily - 7 x weekly - 3 sets - 10 reps - Mini Lunge  - 1 x daily - 7 x weekly - 3 sets - 10 reps   PATIENT EDUCATION:  Education details: Low amplitude strengthening with progression to dynamic activity Person educated: Patient Education method: Explanation, Demonstration, Tactile cues, Verbal cues, and Handouts Education comprehension: verbalized understanding, returned demonstration, verbal cues required, tactile cues required, and needs further education   HOME EXERCISE PROGRAM: Access Code: [email or web address] URL: [email or web address] Date: 08/22/2021 Prepared by: Carolyne Littles  ASSESSMENT:  CLINICAL IMPRESSION: The patient is making excellent progress. We reviewed more exercises for his program including racquet specific drills and eccentric training that will help him with is ability to run. We also reviewed a thomas stretch. At this time he will continue to work on his exercises and come back for follow up PRN. We gave him an updated HEP.   OBJECTIVE IMPAIRMENTS decreased mobility, decreased strength, pain, and pain running  .   ACTIVITY LIMITATIONS lifting and tennis and jogging   PARTICIPATION LIMITATIONS:  tennis and jogging   PERSONAL FACTORS Time since onset of injury/illness/exacerbation are also affecting patient's functional outcome.   REHAB POTENTIAL: Excellent  CLINICAL DECISION MAKING: Stable/uncomplicated  EVALUATION COMPLEXITY: Low   GOALS: Goals reviewed with patient? Yes  SHORT TERM GOALS: Target date: 10/09/2021  Patient will increase right hip flexor strength by 5 lbs  Baseline: Goal  status: INITIAL  2.  Patient will demonstrate good static right hip single leg stability  Baseline:  Goal status: INITIAL  3.  Patient will be independent with base exercise program  Baseline:  Goal status: INITIAL  LONG TERM GOALS: Target date: 10/30/2021   Patient will return to jogging without pain  Baseline:  Goal status: INITIAL  2.  Patient will play tennis without pain  Baseline:  Goal status: INITIAL    PLAN: PT FREQUENCY: 1x/week  PT DURATION: 6 weeks  PLANNED INTERVENTIONS: Therapeutic exercises, Therapeutic activity, Neuromuscular re-education, Balance training, Gait training, Patient/Family education, Self Care, Joint mobilization, Stair training, Aquatic Therapy, Dry Needling, Electrical stimulation, Cryotherapy, Moist heat, Taping, Ultrasound, and Manual therapy  PLAN FOR NEXT SESSION: assess tolerate to treatment.  Add in more dynamic movement activity. If patient continues to have pain consider weekly therapy to work on dynamic therapy; add kettle bell swing; review lifting technique. Manual therapy if needed; consider needling anterior hip if the pain persists.   PHYSICAL THERAPY DISCHARGE SUMMARY  Visits from Start of Care: 2  Current functional level related to goals / functional outcomes: Improved pain   Remaining deficits: Pain at times    Education / Equipment: HEP   Patient agrees to discharge. Patient goals were met. Patient is being discharged due to meeting the stated rehab goals.    Carney Living, PT 09/18/2021, 9:46 AM

## 2021-09-18 ENCOUNTER — Encounter (HOSPITAL_BASED_OUTPATIENT_CLINIC_OR_DEPARTMENT_OTHER): Payer: Self-pay | Admitting: Physical Therapy

## 2021-09-21 ENCOUNTER — Other Ambulatory Visit: Payer: Self-pay | Admitting: Internal Medicine

## 2021-11-01 DIAGNOSIS — L719 Rosacea, unspecified: Secondary | ICD-10-CM | POA: Diagnosis not present

## 2021-11-06 DIAGNOSIS — J301 Allergic rhinitis due to pollen: Secondary | ICD-10-CM | POA: Diagnosis not present

## 2021-11-14 ENCOUNTER — Encounter: Payer: Self-pay | Admitting: Internal Medicine

## 2021-11-15 ENCOUNTER — Other Ambulatory Visit: Payer: Self-pay | Admitting: Internal Medicine

## 2021-11-15 DIAGNOSIS — T753XXA Motion sickness, initial encounter: Secondary | ICD-10-CM | POA: Insufficient documentation

## 2021-11-15 MED ORDER — SCOPOLAMINE 1 MG/3DAYS TD PT72: 1.0000 | MEDICATED_PATCH | TRANSDERMAL | 0 refills | Status: DC

## 2021-12-02 ENCOUNTER — Other Ambulatory Visit: Payer: Self-pay | Admitting: Internal Medicine

## 2021-12-02 DIAGNOSIS — F418 Other specified anxiety disorders: Secondary | ICD-10-CM

## 2021-12-12 DIAGNOSIS — R7989 Other specified abnormal findings of blood chemistry: Secondary | ICD-10-CM | POA: Diagnosis not present

## 2021-12-12 DIAGNOSIS — E291 Testicular hypofunction: Secondary | ICD-10-CM | POA: Diagnosis not present

## 2021-12-12 DIAGNOSIS — E2740 Unspecified adrenocortical insufficiency: Secondary | ICD-10-CM | POA: Diagnosis not present

## 2021-12-12 DIAGNOSIS — R5383 Other fatigue: Secondary | ICD-10-CM | POA: Diagnosis not present

## 2021-12-12 DIAGNOSIS — E039 Hypothyroidism, unspecified: Secondary | ICD-10-CM | POA: Diagnosis not present

## 2022-01-04 ENCOUNTER — Ambulatory Visit: Payer: Self-pay

## 2022-01-04 ENCOUNTER — Ambulatory Visit (INDEPENDENT_AMBULATORY_CARE_PROVIDER_SITE_OTHER): Payer: BC Managed Care – PPO | Admitting: Sports Medicine

## 2022-01-04 VITALS — BP 118/80 | HR 78 | Ht 69.0 in | Wt 193.0 lb

## 2022-01-04 DIAGNOSIS — D1721 Benign lipomatous neoplasm of skin and subcutaneous tissue of right arm: Secondary | ICD-10-CM | POA: Diagnosis not present

## 2022-01-04 DIAGNOSIS — G8929 Other chronic pain: Secondary | ICD-10-CM

## 2022-01-04 DIAGNOSIS — M25511 Pain in right shoulder: Secondary | ICD-10-CM | POA: Diagnosis not present

## 2022-01-04 NOTE — Patient Instructions (Addendum)
Good to see you Shoulder HEP 3 week follow up  

## 2022-01-04 NOTE — Progress Notes (Signed)
Jacob [name] D.[place] Phone: [phone]   Assessment and Plan:     1. Chronic right shoulder pain 2. Lipoma of right upper extremity  -Chronic with exacerbation, initial sports medicine visit - Patient's primary area of concern was a mass over right anterior shoulder consistent with a lipoma measuring 2.32 cm length by 0.68 cm depth based on ultrasound - Patient does have history of arthroscopy of right shoulder in 2021 with mild recurrence of pain, though no concerning signs on physical exam or ultrasound - Discussed with patient that there have been small studies where injecting corticosteroid directly into the center of a lipoma can cause fat atrophy and overall shrinking of lipoma size.  Discussed side effects including skin discoloration, potential deformity from fat atrophy, bleeding, infection.  Patient was agreeable to trialing corticosteroid injection into the lipoma to see if we could shrink it size and decrease its overall size.  Tolerated well per note below  Ultrasound-guided injection Procedure Note:  Side: Right shoulder superficial to proximal biceps musculature Diagnosis: Lipoma [place] Indication:  - accuracy is paramount for diagnosis - to ensure therapeutic efficacy or procedural success - to reduce procedural risk  After explaining the procedure, viable alternatives, risks, and answering any questions, consent was given verbally.  Injection site was cleaned with sterile chlorhexidine prep.  Under ultrasound guidance, needle was advanced into center of lipoma measuring 2.32 cm in length by 0.68 cm in height.  A steroid injection was performed using 80 mg of triamcinolone (KENALOG) '40mg'$ /ml into the center of lipoma. This was well tolerated.  Dressing placed and post injection instructions were given.   Call or return to clinic prn if these symptoms worsen or fail to improve as anticipated.  Of  note, on ultrasound, unremarkable appearance of biceps tendon and biceps groove and normal appearance of subscapularis distal muscle and tendon.  Pertinent previous records reviewed include none   Follow Up: 4 weeks for reevaluation.  Would repeat ultrasound to see if lipoma size has shrunk and could consider a one-time repeat Kenalog 40 mg/ML injection into lipoma.   Subjective:   I, Pincus Badder, am serving as a Education administrator for Doctor Glennon Mac  Chief Complaint: right shoulder pain   HPI:   08/23/2021 Jacob [name] is a 41 y.o. male who presents to [place] [facility] today for R shoulder pain. Pt was previously seen by Dr. Georgina Snell on 04/17/21 L gastroc strain. Today, pt c/o R shoulder pain ongoing since Monday, 7/31, after playing tennis and doing a lot of serving. Pt has been seen for his R shoulder back in 2021 and had a R subacromial steroid injection on 12/07/19 and was referred to see Dr. Onnie Graham who performed a R shoulder debridement on 04/01/19. Pt has been playing tennis every other day. Pt locates his pain to the lateral aspect of the R shoulder   Aggravates: shoulder ABD, IR, ER (backhand and forehand motion)   Dx imaging: 11/23/19 R shoulder MRI  01/04/22 Patient states he has a lump on his bicep/ deltoid area, not painful but thinks he has pain when he moves it , no decrease ROM , feels like it has grown , no numbness or tingling , no meds for the pain   Relevant Historical Information: Hypertension, history of right hip arthroscopy 2021  Additional pertinent review of systems negative.   Current Outpatient Medications:    clomiPHENE (CLOMID) 50 MG  tablet, Take 50 mg by mouth. 50 mg M,W,F and 25 mg T,Th,S,Su, Disp: , Rfl:    DULoxetine (CYMBALTA) 30 MG capsule, TAKE 1 CAPSULE (30 MG TOTAL) BY MOUTH DAILY. MUST KEEP APPT FOR FUTURE REFILLS, Disp: 90 capsule, Rfl: 1   levocetirizine (XYZAL) 5 MG tablet, TAKE 2 TABLETS (10 MG TOTAL) BY MOUTH AT  BEDTIME., Disp: 180 tablet, Rfl: 1   pantoprazole (PROTONIX) 40 MG tablet, TAKE ONE TABLET BY MOUTH 30 TO 60 MINUTES BEFORE BREAKFAST AND DINNER, Disp: 180 tablet, Rfl: 1   rosuvastatin (CRESTOR) 10 MG tablet, Take 1 tablet (10 mg total) by mouth daily., Disp: 90 tablet, Rfl: 3   scopolamine (TRANSDERM-SCOP) 1 MG/3DAYS, Place 1 patch (1.5 mg total) onto the skin every 3 (three) days., Disp: 10 patch, Rfl: 0   UNABLE TO FIND, Three grains nature thyroid, Disp: , Rfl:    Objective:     Vitals:   01/04/22 1116  BP: 118/80  Pulse: 78  SpO2: 98%  Weight: 193 lb (87.5 kg)  Height: '5\' 9"'$  (1.753 m)      Body mass index is 28.5 kg/m.    Physical Exam:    Gen: Appears well, nad, nontoxic and pleasant Neuro:sensation intact, strength is 5/5 with df/pf/inv/ev, muscle tone wnl Skin: no suspicious lesion or defmority Psych: A&O, appropriate mood and affect  Shoulder: no deformity, swelling or muscle wasting No scapular winging FF 180, abd 180, int 0, ext 90 TTP anterior shoulder musculature with mobile mass.  No open lesion, erythema, discoloration, warmth NTTP over the [place], clavicle, ac, coracoid, biceps groove, humerus, deltoid, trapezius, cervical spine    Electronically signed by:  Jacob [name] D.[facility] 12:24 PM 01/04/22

## 2022-01-10 ENCOUNTER — Encounter: Payer: Self-pay | Admitting: Internal Medicine

## 2022-01-10 ENCOUNTER — Ambulatory Visit (INDEPENDENT_AMBULATORY_CARE_PROVIDER_SITE_OTHER): Payer: BC Managed Care – PPO | Admitting: Internal Medicine

## 2022-01-10 VITALS — BP 124/76 | HR 67 | Temp 98.3°F | Resp 16 | Ht 69.0 in | Wt 195.0 lb

## 2022-01-10 DIAGNOSIS — K219 Gastro-esophageal reflux disease without esophagitis: Secondary | ICD-10-CM | POA: Diagnosis not present

## 2022-01-10 DIAGNOSIS — Z0000 Encounter for general adult medical examination without abnormal findings: Secondary | ICD-10-CM

## 2022-01-10 DIAGNOSIS — E7841 Elevated Lipoprotein(a): Secondary | ICD-10-CM

## 2022-01-10 DIAGNOSIS — I10 Essential (primary) hypertension: Secondary | ICD-10-CM

## 2022-01-10 DIAGNOSIS — E039 Hypothyroidism, unspecified: Secondary | ICD-10-CM

## 2022-01-10 DIAGNOSIS — K149 Disease of tongue, unspecified: Secondary | ICD-10-CM | POA: Diagnosis not present

## 2022-01-10 DIAGNOSIS — B370 Candidal stomatitis: Secondary | ICD-10-CM

## 2022-01-10 LAB — CBC WITH DIFFERENTIAL/PLATELET
Basophils Absolute: 0 10*3/uL (ref 0.0–0.1)
Basophils Relative: 0.7 % (ref 0.0–3.0)
Eosinophils Absolute: 0.1 10*3/uL (ref 0.0–0.7)
Eosinophils Relative: 1.7 % (ref 0.0–5.0)
HCT: 46.2 % (ref 39.0–52.0)
Hemoglobin: 15.8 g/dL (ref 13.0–17.0)
Lymphocytes Relative: 42.1 % (ref 12.0–46.0)
Lymphs Abs: 2.8 10*3/uL (ref 0.7–4.0)
MCHC: 34.1 g/dL (ref 30.0–36.0)
MCV: 93.4 fl (ref 78.0–100.0)
Monocytes Absolute: 0.7 10*3/uL (ref 0.1–1.0)
Monocytes Relative: 10.1 % (ref 3.0–12.0)
Neutro Abs: 3 10*3/uL (ref 1.4–7.7)
Neutrophils Relative %: 45.4 % (ref 43.0–77.0)
Platelets: 236 10*3/uL (ref 150.0–400.0)
RBC: 4.95 Mil/uL (ref 4.22–5.81)
RDW: 12.6 % (ref 11.5–15.5)
WBC: 6.7 10*3/uL (ref 4.0–10.5)

## 2022-01-10 LAB — BASIC METABOLIC PANEL
BUN: 17 mg/dL (ref 6–23)
CO2: 29 mEq/L (ref 19–32)
Calcium: 9.4 mg/dL (ref 8.4–10.5)
Chloride: 105 mEq/L (ref 96–112)
Creatinine, Ser: 1.09 mg/dL (ref 0.40–1.50)
GFR: 84.23 mL/min (ref >60.00)
Glucose, Bld: 100 mg/dL — ABNORMAL HIGH (ref 70–99)
Potassium: 4.4 mEq/L (ref 3.5–5.1)
Sodium: 140 mEq/L (ref 135–145)

## 2022-01-10 LAB — HEPATIC FUNCTION PANEL
ALT: 17 U/L (ref 0–53)
AST: 20 U/L (ref 0–37)
Albumin: 4.4 g/dL (ref 3.5–5.2)
Alkaline Phosphatase: 47 U/L (ref 39–117)
Bilirubin, Direct: 0.1 mg/dL (ref 0.0–0.3)
Total Bilirubin: 0.6 mg/dL (ref 0.2–1.2)
Total Protein: 6.8 g/dL (ref 6.0–8.3)

## 2022-01-10 LAB — FOLATE: Folate: 23.3 ng/mL (ref >5.9)

## 2022-01-10 LAB — VITAMIN B12: Vitamin B-12: 770 pg/mL (ref 211–911)

## 2022-01-10 NOTE — Progress Notes (Signed)
Subjective:  Patient ID: Jacob [name], male    DOB: 1980/04/29  Age: 41 y.o. MRN: [id number]  CC: Annual Exam, Hypothyroidism, and Hyperlipidemia   HPI JUDAH CARCHI presents for a CPX and f/up -  He is taking doxycycline for nasal infection and complains of a red painful tongue.  He tells me his heartburn symptoms are well-controlled.  He denies odynophagia or dysphagia.  Outpatient Medications Prior to Visit  Medication Sig Dispense Refill   clomiPHENE (CLOMID) 50 MG tablet Take 50 mg by mouth. 50 mg M,W,F and 25 mg T,Th,S,Su     DULoxetine (CYMBALTA) 30 MG capsule TAKE 1 CAPSULE (30 MG TOTAL) BY MOUTH DAILY. MUST KEEP APPT FOR FUTURE REFILLS 90 capsule 1   levocetirizine (XYZAL) 5 MG tablet TAKE 2 TABLETS (10 MG TOTAL) BY MOUTH AT BEDTIME. 180 tablet 1   pantoprazole (PROTONIX) 40 MG tablet TAKE ONE TABLET BY MOUTH 30 TO 60 MINUTES BEFORE BREAKFAST AND DINNER 180 tablet 1   rosuvastatin (CRESTOR) 10 MG tablet Take 1 tablet (10 mg total) by mouth daily. 90 tablet 3   scopolamine (TRANSDERM-SCOP) 1 MG/3DAYS Place 1 patch (1.5 mg total) onto the skin every 3 (three) days. 10 patch 0   UNABLE TO FIND Three grains nature thyroid     No facility-administered medications prior to visit.    ROS Review of Systems  Constitutional: Negative.  Negative for diaphoresis and fatigue.  HENT: Negative.  Negative for sore throat, trouble swallowing and voice change.   Eyes: Negative.   Respiratory:  Negative for cough, chest tightness, shortness of breath and wheezing.   Cardiovascular:  Negative for chest pain, palpitations and leg swelling.  Gastrointestinal:  Negative for abdominal pain, diarrhea, nausea and vomiting.  Endocrine: Negative.   Genitourinary: Negative.  Negative for difficulty urinating, dysuria, penile pain, penile swelling, scrotal swelling and testicular pain.  Musculoskeletal: Negative.  Negative for arthralgias and myalgias.  Skin: Negative.   Neurological:  Negative.  Negative for dizziness, weakness, light-headedness and headaches.  Hematological:  Negative for adenopathy. Does not bruise/bleed easily.  Psychiatric/Behavioral: Negative.      Objective:  BP 124/76 (BP Location: Left Arm, Patient Position: Sitting, Cuff Size: Large)   Pulse 67   Temp 98.3 F (36.8 C) (Oral)   Resp 16   Ht '5\' 9"'$  (1.753 m)   Wt 195 lb (88.5 kg)   SpO2 97%   BMI 28.80 kg/m   BP Readings from Last 3 Encounters:  01/10/22 124/76  01/04/22 118/80  08/23/21 (!) 148/92    Wt Readings from Last 3 Encounters:  01/10/22 195 lb (88.5 kg)  01/04/22 193 lb (87.5 kg)  08/23/21 196 lb 9.6 oz (89.2 kg)    Physical Exam Vitals reviewed.  HENT:     Head:     Comments: His tongue is erythematous but there is no exudate.    Nose: Nose normal.     Mouth/Throat:     Mouth: Mucous membranes are moist.     Tongue: No lesions.     Tonsils: No tonsillar exudate.  Eyes:     General: No scleral icterus.    Conjunctiva/sclera: Conjunctivae normal.  Cardiovascular:     Rate and Rhythm: Normal rate and regular rhythm.     Heart sounds: No murmur heard. Pulmonary:     Effort: Pulmonary effort is normal.     Breath sounds: No stridor. No wheezing, rhonchi or rales.  Abdominal:     General: Abdomen is flat.  Palpations: There is no mass.     Tenderness: There is no abdominal tenderness. There is no guarding.     Hernia: No hernia is present.  Musculoskeletal:        General: Normal range of motion.     Cervical back: Neck supple.     Right lower leg: No edema.     Left lower leg: No edema.  Lymphadenopathy:     Cervical: No cervical adenopathy.  Skin:    General: Skin is warm and dry.  Neurological:     General: No focal deficit present.     Mental Status: He is alert.  Psychiatric:        Mood and Affect: Mood normal.        Behavior: Behavior normal.     Lab Results  Component Value Date   WBC 6.7 01/10/2022   HGB 15.8 01/10/2022   HCT 46.2  01/10/2022   PLT 236.0 01/10/2022   GLUCOSE 100 (H) 01/10/2022   CHOL 135 08/03/2021   TRIG 58 08/03/2021   HDL 55 08/03/2021   LDLCALC 68 08/03/2021   ALT 17 01/10/2022   AST 20 01/10/2022   NA 140 01/10/2022   K 4.4 01/10/2022   CL 105 01/10/2022   CREATININE 1.09 01/10/2022   BUN 17 01/10/2022   CO2 29 01/10/2022   TSH 0.92 01/10/2022   PSA 0.53 01/10/2021   HGBA1C 5.4 08/03/2021    MR WRIST LEFT W CONTRAST  Result Date: 12/10/2019 CLINICAL DATA:  Ulnar wrist pain for 8-9 months. Remote arthroscopy. Carpal tunnel syndrome suspected. EXAM: MRI OF THE LEFT WRIST WITH CONTRAST (MR Arthrogram) TECHNIQUE: Multiplanar, multisequence MR imaging of the wrist was performed immediately following contrast injection into the radiocarpal joint under fluoroscopic guidance. No intravenous contrast was administered. COMPARISON:  Injection images same date. Wrist radiographs 11/03/2019. FINDINGS: Ligaments: The scapholunate and lunotriquetral ligaments appear intact. There is no contrast in the midcarpal compartment. Triangular fibrocartilage: The triangular fibrocartilage complex appears intact. There is no contrast in the distal radioulnar compartment. The ulnar variance is neutral. Tendons: There is dorsal leakage of contrast at the injection site with partial opacification of the extensor tendon sheaths. Possible mild extensor carpi ulnaris tenosynovitis without tear. The extensor tendons otherwise appear normal. The flexor tendons appear normal. Carpal tunnel/median nerve: Unremarkable. Guyon's canal: Unremarkable. Joint/cartilage: The radiocarpal joint is well opacified with contrast. No focal chondral defect. No opacification of the midcarpal or distal radioulnar compartments. Bones/carpal alignment: The carpal bone alignment is normal.There are no significant extra-articular osseous findings. Other: The periarticular soft tissues appear normal. No ganglia are identified. IMPRESSION: 1. Possible mild  extensor carpi ulnaris tenosynovitis without tear. 2. The intercarpal and TFCC appear intact. 3. The contents of the carpal tunnel appear normal. Electronically Signed   By: Richardean Sale M.D.   On: 12/10/2019 09:07   DG FLUORO GUIDED NEEDLE PLC ASPIRATION/INJECTION LOC  Result Date: 12/08/2019 CLINICAL DATA:  Left wrist pain. FLUOROSCOPY TIME:  Fluoroscopy Time: 10 seconds Radiation Exposure Index: 0.26 microGray*m^2 PROCEDURE: LEFT WRIST INJECTION UNDER FLUOROSCOPY An appropriate skin entrance site was determined. The site was marked, prepped with Betadine, draped in the usual sterile fashion, and infiltrated locally with 1% Lidocaine. A 25 gauge skin needle was advanced into the radiocarpal joint under intermittent fluoroscopy. A mixture of 0.1 mL of MultiHance, 15 mL of Isovue-M 200, and 5 mL of sterile saline was then used to opacify the proximal carpal joint. 2 mL of this mixture were injected.  No immediate complication. IMPRESSION: Technically successful left wrist injection for MRI. Electronically Signed   By: Logan Bores M.D.   On: 12/08/2019 16:28    Assessment & Plan:   Lealand was seen today for annual exam, hypothyroidism and hyperlipidemia.  Diagnoses and all orders for this visit:  Essential hypertension- His blood pressure is well-controlled with lifestyle modifications. -     Thyroid Panel With TSH; Future -     CBC with Differential/Platelet; Future -     Basic metabolic panel; Future -     Hepatic function panel; Future -     Thyroid Panel With TSH -     CBC with Differential/Platelet -     Basic metabolic panel -     Hepatic function panel  Acquired hypothyroidism- He is euthyroid. -     Thyroid Panel With TSH; Future -     CBC with Differential/Platelet; Future -     Hepatic function panel; Future -     Thyroid Panel With TSH -     CBC with Differential/Platelet -     Hepatic function panel  Routine general medical examination at a health care facility- Exam  completed, labs reviewed, vaccines reviewed and updated, no cancer screenings indicated, patient education was given.  Gastroesophageal reflux disease without esophagitis- He is doing well on the PPI. -     CBC with Differential/Platelet; Future -     Hepatic function panel; Future -     CBC with Differential/Platelet -     Hepatic function panel  Elevated lipoprotein(a) -     Hepatic function panel; Future -     Hepatic function panel  Red tongue -     Folate; Future -     Vitamin B12; Future -     Folate -     Vitamin B12  Oral candidiasis -     fluconazole (DIFLUCAN) 100 MG tablet; Take 1 tablet (100 mg total) by mouth daily for 10 days.   I am having Keyshon Stein. Demartin start on fluconazole. I am also having him maintain his clomiPHENE, UNABLE TO FIND, levocetirizine, rosuvastatin, pantoprazole, scopolamine, and DULoxetine.  Meds ordered this encounter  Medications   fluconazole (DIFLUCAN) 100 MG tablet    Sig: Take 1 tablet (100 mg total) by mouth daily for 10 days.    Dispense:  10 tablet    Refill:  0     Follow-up: Return in about 6 months (around 07/12/2022).  Scarlette Calico, MD

## 2022-01-10 NOTE — Patient Instructions (Signed)
Health Maintenance, Male Adopting a healthy lifestyle and getting preventive care are important in promoting health and wellness. Ask your health care provider about: The right schedule for you to have regular tests and exams. Things you can do on your own to prevent diseases and keep yourself healthy. What should I know about diet, weight, and exercise? Eat a healthy diet  Eat a diet that includes plenty of vegetables, fruits, low-fat dairy products, and lean protein. Do not eat a lot of foods that are high in solid fats, added sugars, or sodium. Maintain a healthy weight Body mass index (BMI) is a measurement that can be used to identify possible weight problems. It estimates body fat based on height and weight. Your health care provider can help determine your BMI and help you achieve or maintain a healthy weight. Get regular exercise Get regular exercise. This is one of the most important things you can do for your health. Most adults should: Exercise for at least 150 minutes each week. The exercise should increase your heart rate and make you sweat (moderate-intensity exercise). Do strengthening exercises at least twice a week. This is in addition to the moderate-intensity exercise. Spend less time sitting. Even light physical activity can be beneficial. Watch cholesterol and blood lipids Have your blood tested for lipids and cholesterol at 41 years of age, then have this test every 5 years. You may need to have your cholesterol levels checked more often if: Your lipid or cholesterol levels are high. You are older than 40 years of age. You are at high risk for heart disease. What should I know about cancer screening? Many types of cancers can be detected early and may often be prevented. Depending on your health history and family history, you may need to have cancer screening at various ages. This may include screening for: Colorectal cancer. Prostate cancer. Skin cancer. Lung  cancer. What should I know about heart disease, diabetes, and high blood pressure? Blood pressure and heart disease High blood pressure causes heart disease and increases the risk of stroke. This is more likely to develop in people who have high blood pressure readings or are overweight. Talk with your health care provider about your target blood pressure readings. Have your blood pressure checked: Every 3-5 years if you are 18-39 years of age. Every year if you are 40 years old or older. If you are between the ages of 65 and 75 and are a current or former smoker, ask your health care provider if you should have a one-time screening for abdominal aortic aneurysm (AAA). Diabetes Have regular diabetes screenings. This checks your fasting blood sugar level. Have the screening done: Once every three years after age 45 if you are at a normal weight and have a low risk for diabetes. More often and at a younger age if you are overweight or have a high risk for diabetes. What should I know about preventing infection? Hepatitis B If you have a higher risk for hepatitis B, you should be screened for this virus. Talk with your health care provider to find out if you are at risk for hepatitis B infection. Hepatitis C Blood testing is recommended for: Everyone born from 1945 through 1965. Anyone with known risk factors for hepatitis C. Sexually transmitted infections (STIs) You should be screened each year for STIs, including gonorrhea and chlamydia, if: You are sexually active and are younger than 41 years of age. You are older than 41 years of age and your   health care provider tells you that you are at risk for this type of infection. Your sexual activity has changed since you were last screened, and you are at increased risk for chlamydia or gonorrhea. Ask your health care provider if you are at risk. Ask your health care provider about whether you are at high risk for HIV. Your health care provider  may recommend a prescription medicine to help prevent HIV infection. If you choose to take medicine to prevent HIV, you should first get tested for HIV. You should then be tested every 3 months for as long as you are taking the medicine. Follow these instructions at home: Alcohol use Do not drink alcohol if your health care provider tells you not to drink. If you drink alcohol: Limit how much you have to 0-2 drinks a day. Know how much alcohol is in your drink. In the [place] one drink equals one 12 oz bottle of beer (355 mL), one 5 oz glass of wine (148 mL), or one 1 oz glass of hard liquor (44 mL). Lifestyle Do not use any products that contain nicotine or tobacco. These products include cigarettes, chewing tobacco, and vaping devices, such as e-cigarettes. If you need help quitting, ask your health care provider. Do not use street drugs. Do not share needles. Ask your health care provider for help if you need support or information about quitting drugs. General instructions Schedule regular health, dental, and eye exams. Stay current with your vaccines. Tell your health care provider if: You often feel depressed. You have ever been abused or do not feel safe at home. Summary Adopting a healthy lifestyle and getting preventive care are important in promoting health and wellness. Follow your health care provider's instructions about healthy diet, exercising, and getting tested or screened for diseases. Follow your health care provider's instructions on monitoring your cholesterol and blood pressure. This information is not intended to replace advice given to you by your health care provider. Make sure you discuss any questions you have with your health care provider. Document Revised: 05/29/2020 Document Reviewed: 05/29/2020 Elsevier Patient Education  2023 Elsevier Inc.  

## 2022-01-11 DIAGNOSIS — K149 Disease of tongue, unspecified: Secondary | ICD-10-CM | POA: Insufficient documentation

## 2022-01-11 DIAGNOSIS — B370 Candidal stomatitis: Secondary | ICD-10-CM | POA: Insufficient documentation

## 2022-01-11 DIAGNOSIS — R030 Elevated blood-pressure reading, without diagnosis of hypertension: Secondary | ICD-10-CM | POA: Diagnosis not present

## 2022-01-11 LAB — THYROID PANEL WITH TSH
Free Thyroxine Index: 1.8 (ref 1.4–3.8)
T3 Uptake: 30 % (ref 22–35)
T4, Total: 5.9 ug/dL (ref 4.9–10.5)
TSH: 0.92 mIU/L (ref 0.40–4.50)

## 2022-01-11 MED ORDER — FLUCONAZOLE 100 MG PO TABS: 100.0000 mg | ORAL_TABLET | Freq: Every day | ORAL | 0 refills | Status: AC

## 2022-01-24 NOTE — Progress Notes (Deleted)
    Benito Mccreedy D.[place] Phone: [phone]   Assessment and Plan:     There are no diagnoses linked to this encounter.  ***   Pertinent previous records reviewed include ***   Follow Up: ***     Subjective:   I, Jacob [name], am serving as a Education administrator for Doctor Glennon Mac   Chief Complaint: right shoulder pain    HPI:    08/23/2021 Jacob [name] is a 42 y.o. male who presents to [place] [facility] today for R shoulder pain. Pt was previously seen by Dr. Georgina Snell on 04/17/21 L gastroc strain. Today, pt c/o R shoulder pain ongoing since Monday, 7/31, after playing tennis and doing a lot of serving. Pt has been seen for his R shoulder back in 2021 and had a R subacromial steroid injection on 12/07/19 and was referred to see Dr. Onnie Graham who performed a R shoulder debridement on 04/01/19. Pt has been playing tennis every other day. Pt locates his pain to the lateral aspect of the R shoulder   Aggravates: shoulder ABD, IR, ER (backhand and forehand motion)   Dx imaging: 11/23/19 R shoulder MRI   01/04/22 Patient states he has a lump on his bicep/ deltoid area, not painful but thinks he has pain when he moves it , no decrease ROM , feels like it has grown , no numbness or tingling , no meds for the pain   01/25/2022 Patient states    Relevant Historical Information: Hypertension, history of right hip arthroscopy 2021  Additional pertinent review of systems negative.   Current Outpatient Medications:    clomiPHENE (CLOMID) 50 MG tablet, Take 50 mg by mouth. 50 mg M,W,F and 25 mg T,Th,S,Su, Disp: , Rfl:    DULoxetine (CYMBALTA) 30 MG capsule, TAKE 1 CAPSULE (30 MG TOTAL) BY MOUTH DAILY. MUST KEEP APPT FOR FUTURE REFILLS, Disp: 90 capsule, Rfl: 1   levocetirizine (XYZAL) 5 MG tablet, TAKE 2 TABLETS (10 MG TOTAL) BY MOUTH AT BEDTIME., Disp: 180 tablet, Rfl: 1   pantoprazole  (PROTONIX) 40 MG tablet, TAKE ONE TABLET BY MOUTH 30 TO 60 MINUTES BEFORE BREAKFAST AND DINNER, Disp: 180 tablet, Rfl: 1   rosuvastatin (CRESTOR) 10 MG tablet, Take 1 tablet (10 mg total) by mouth daily., Disp: 90 tablet, Rfl: 3   scopolamine (TRANSDERM-SCOP) 1 MG/3DAYS, Place 1 patch (1.5 mg total) onto the skin every 3 (three) days., Disp: 10 patch, Rfl: 0   UNABLE TO FIND, Three grains nature thyroid, Disp: , Rfl:    Objective:     There were no vitals filed for this visit.    There is no height or weight on file to calculate BMI.    Physical Exam:    ***   Electronically signed by:  Benito Mccreedy D.[facility] 10:38 AM 01/24/22

## 2022-01-25 ENCOUNTER — Ambulatory Visit: Payer: BC Managed Care – PPO | Admitting: Sports Medicine

## 2022-02-19 ENCOUNTER — Encounter: Payer: Self-pay | Admitting: Internal Medicine

## 2022-02-20 ENCOUNTER — Other Ambulatory Visit: Payer: Self-pay | Admitting: Internal Medicine

## 2022-02-20 DIAGNOSIS — B370 Candidal stomatitis: Secondary | ICD-10-CM

## 2022-02-20 MED ORDER — CLOTRIMAZOLE 10 MG MT TROC: 10.0000 mg | Freq: Every day | OROMUCOSAL | 3 refills | Status: AC

## 2022-03-04 DIAGNOSIS — R7989 Other specified abnormal findings of blood chemistry: Secondary | ICD-10-CM | POA: Diagnosis not present

## 2022-03-04 DIAGNOSIS — R5383 Other fatigue: Secondary | ICD-10-CM | POA: Diagnosis not present

## 2022-03-04 DIAGNOSIS — R790 Abnormal level of blood mineral: Secondary | ICD-10-CM | POA: Diagnosis not present

## 2022-03-04 DIAGNOSIS — E039 Hypothyroidism, unspecified: Secondary | ICD-10-CM | POA: Diagnosis not present

## 2022-03-04 DIAGNOSIS — E60 Dietary zinc deficiency: Secondary | ICD-10-CM | POA: Diagnosis not present

## 2022-03-04 DIAGNOSIS — E291 Testicular hypofunction: Secondary | ICD-10-CM | POA: Diagnosis not present

## 2022-03-14 DIAGNOSIS — E039 Hypothyroidism, unspecified: Secondary | ICD-10-CM | POA: Diagnosis not present

## 2022-03-14 DIAGNOSIS — E559 Vitamin D deficiency, unspecified: Secondary | ICD-10-CM | POA: Diagnosis not present

## 2022-03-14 DIAGNOSIS — R5383 Other fatigue: Secondary | ICD-10-CM | POA: Diagnosis not present

## 2022-03-14 DIAGNOSIS — K219 Gastro-esophageal reflux disease without esophagitis: Secondary | ICD-10-CM | POA: Diagnosis not present

## 2022-03-14 DIAGNOSIS — E291 Testicular hypofunction: Secondary | ICD-10-CM | POA: Diagnosis not present

## 2022-04-11 ENCOUNTER — Ambulatory Visit (INDEPENDENT_AMBULATORY_CARE_PROVIDER_SITE_OTHER): Payer: BC Managed Care – PPO | Admitting: Sports Medicine

## 2022-04-11 ENCOUNTER — Other Ambulatory Visit: Payer: Self-pay

## 2022-04-11 VITALS — HR 73 | Ht 69.0 in | Wt 196.0 lb

## 2022-04-11 DIAGNOSIS — M79661 Pain in right lower leg: Secondary | ICD-10-CM | POA: Diagnosis not present

## 2022-04-11 DIAGNOSIS — S86111A Strain of other muscle(s) and tendon(s) of posterior muscle group at lower leg level, right leg, initial encounter: Secondary | ICD-10-CM | POA: Diagnosis not present

## 2022-04-11 NOTE — Patient Instructions (Addendum)
Good to see you  Calf HEP  Tylenol and ibuprofen as needed Relative rest for a week and then gradually return to activity  As needed follow up

## 2022-04-11 NOTE — Progress Notes (Signed)
Jacob [name] D.[place] Phone: [phone]   Assessment and Plan:     1. Right calf pain 2. Gastrocnemius strain, right, initial encounter  -Acute, uncomplicated, initial sports medicine visit - Most consistent with grade 1 gastrocnemius strain occurring during tennis activities - No tearing or edema seen on ultrasound and patient had maintained strength with single-leg weightbearing plantarflexion - Recommend relative rest with gradual return to tennis and other physical activities as tolerated over the next 1 to 2 weeks - May use Tylenol/NSAIDs as needed  Sports Medicine: Musculoskeletal Ultrasound. Exam: Limited US of right calf Diagnosis: Right calf pain  US Findings: Unremarkable examination of medial and lateral gastroc musculature, proximal tendons, Achilles tendon  US Impression:  Grade 1 gastroc strain  Pertinent previous records reviewed include none   Follow Up: As needed if no improvement   Subjective:   I, Jacob [name], am serving as a Education administrator for Jacob [name]  Chief Complaint: right calf pain   HPI:   04/11/22 Patient is a 42 year old male complaining of right calf pain. Patient states that he was playing tennis yesterday, he doesn't remember MOI, he thinks he felt a pop , hx of this on his left calf, no numbness or tingling, antalgic gait, no radiating pain, no meds for the pain   Relevant Historical Information: GERD  Additional pertinent review of systems negative.   Current Outpatient Medications:    clomiPHENE (CLOMID) 50 MG tablet, Take 50 mg by mouth. 50 mg M,W,F and 25 mg T,Th,S,Su, Disp: , Rfl:    clotrimazole (MYCELEX) 10 MG troche, Take 1 tablet (10 mg total) by mouth 5 (five) times daily., Disp: 150 tablet, Rfl: 3   DULoxetine (CYMBALTA) 30 MG capsule, TAKE 1 CAPSULE (30 MG TOTAL) BY MOUTH DAILY. MUST KEEP APPT FOR FUTURE REFILLS, Disp: 90 capsule, Rfl:  1   levocetirizine (XYZAL) 5 MG tablet, TAKE 2 TABLETS (10 MG TOTAL) BY MOUTH AT BEDTIME., Disp: 180 tablet, Rfl: 1   pantoprazole (PROTONIX) 40 MG tablet, TAKE ONE TABLET BY MOUTH 30 TO 60 MINUTES BEFORE BREAKFAST AND DINNER, Disp: 180 tablet, Rfl: 1   rosuvastatin (CRESTOR) 10 MG tablet, Take 1 tablet (10 mg total) by mouth daily., Disp: 90 tablet, Rfl: 3   scopolamine (TRANSDERM-SCOP) 1 MG/3DAYS, Place 1 patch (1.5 mg total) onto the skin every 3 (three) days., Disp: 10 patch, Rfl: 0   UNABLE TO FIND, Three grains nature thyroid, Disp: , Rfl:    Objective:     Vitals:   04/11/22 0836  Pulse: 73  SpO2: 96%  Weight: 196 lb (88.9 kg)  Height: 5\' 9"  (1.753 m)      Body mass index is 28.94 kg/m.    Physical Exam:    Gen: Appears well, nad, nontoxic and pleasant Psych: Alert and oriented, appropriate mood and affect Neuro: sensation intact, strength is 5/5 with df/pf/inv/ev, muscle tone wnl Skin: no susupicious lesions or rashes  Right leg/ankle:  No deformity, no swelling or effusion NTTP over fibular head, lat mal, medial mal, achilles, navicular, base of 5th, ATFL, CFL, deltoid, calcaneous or midfoot TTP over middle and proximal gastroc musculature ROM DF 30, PF 45, inv/ev intact Gait normal Full strength with both leg weightbearing plantarflexion and right single-leg weightbearing plantarflexion, though both reproduce pain and proximal gastroc without deformity of muscle   Electronically signed by:  Jacob [name] D.[facility] 9:05 AM 04/11/22

## 2022-04-25 ENCOUNTER — Other Ambulatory Visit: Payer: Self-pay | Admitting: Internal Medicine

## 2022-04-29 ENCOUNTER — Other Ambulatory Visit: Payer: Self-pay | Admitting: Internal Medicine

## 2022-04-29 ENCOUNTER — Encounter: Payer: Self-pay | Admitting: Internal Medicine

## 2022-04-29 DIAGNOSIS — H1013 Acute atopic conjunctivitis, bilateral: Secondary | ICD-10-CM | POA: Insufficient documentation

## 2022-04-29 MED ORDER — BEPOTASTINE BESILATE 1.5 % OP SOLN: 1.0000 [drp] | Freq: Two times a day (BID) | OPHTHALMIC | 1 refills | Status: DC

## 2022-05-09 ENCOUNTER — Other Ambulatory Visit: Payer: Self-pay

## 2022-05-09 MED ORDER — ROSUVASTATIN CALCIUM 10 MG PO TABS: 10.0000 mg | ORAL_TABLET | Freq: Every day | ORAL | 0 refills | Status: DC

## 2022-05-13 ENCOUNTER — Other Ambulatory Visit: Payer: Self-pay | Admitting: Pharmacist

## 2022-05-13 MED ORDER — ROSUVASTATIN CALCIUM 5 MG PO TABS: 5.0000 mg | ORAL_TABLET | Freq: Every day | ORAL | 3 refills | Status: DC

## 2022-05-21 ENCOUNTER — Other Ambulatory Visit: Payer: Self-pay

## 2022-05-21 ENCOUNTER — Ambulatory Visit (INDEPENDENT_AMBULATORY_CARE_PROVIDER_SITE_OTHER): Payer: BC Managed Care – PPO | Admitting: Internal Medicine

## 2022-05-21 ENCOUNTER — Encounter: Payer: Self-pay | Admitting: Internal Medicine

## 2022-05-21 VITALS — BP 112/77 | HR 93 | Temp 98.3°F | Ht 70.0 in | Wt 192.0 lb

## 2022-05-21 DIAGNOSIS — B370 Candidal stomatitis: Secondary | ICD-10-CM | POA: Diagnosis not present

## 2022-05-21 NOTE — Progress Notes (Unsigned)
[facility]  Reason for Consult: Thrush  Referring Provider: Dr Yetta [name]   HPI:    Jacob [name] is a 42 y.o. male with PMHx as below who presents to the clinic for oral candidiasis.   Jacob [name] has a history of recurrent MRSA skin infections as well as rosacea that has been treated in the past with courses of different antibiotics.  He has previously seen Jacob [name] in 2012 as well as 2015.  In 2012, Jacob [name] prescribed mupirocin and chlorhexidine for decolonization.  He was seen again in 2015 by Jacob [name] for a lesion on his elbow.  At that visit, Jacob [name] noted the patient had been on Bactrim for approximately 1 year when he developed this new lesion on the left elbow.  This was cultured and grew a coagulase-negative Staphylococcus species.  His Bactrim was stopped at that time per Jacob [name] telephone note on 08/09/2013.  During the interval from then until now, he has had episodes of rosacea treated with either doxycycline or Bactrim.  There have been times during his antibiotic courses that he has also developed oral thrush.  This subsequently tends to resolve with clotrimazole troches or a round of fluconazole.  However, his more recent episode of thrush has been refractory to this treatment.  He was advised by Jacob [name] in our clinic to try higher dose fluconazole to see if this was effective.  He began taking fluconazole 400 mg daily approximately 3 to 4 days ago.  This has led to improvement in his symptoms.  Patient's Medications  New Prescriptions   No medications on file  Previous Medications   BEPOTASTINE BESILATE (BEPREVE) 1.5 % SOLN    Place 1 drop into both eyes 2 (two) times daily.   CLOMIPHENE (CLOMID) 50 MG TABLET    Take 50 mg by mouth. 50 mg M,W,F and 25 mg T,Th,S,Su   CLOTRIMAZOLE (MYCELEX) 10 MG TROCHE    Take 1 tablet (10 mg total) by mouth 5 (five) times daily.   DULOXETINE (CYMBALTA) 30 MG CAPSULE    TAKE 1  CAPSULE (30 MG TOTAL) BY MOUTH DAILY. MUST KEEP APPT FOR FUTURE REFILLS   FLUCONAZOLE (DIFLUCAN) 200 MG TABLET    Take 200 mg by mouth 2 (two) times daily.   LEVOCETIRIZINE (XYZAL) 5 MG TABLET    TAKE 2 TABLETS (10 MG TOTAL) BY MOUTH AT BEDTIME.   PANTOPRAZOLE (PROTONIX) 40 MG TABLET    TAKE ONE TABLET BY MOUTH 30 TO 60 MINUTES BEFORE BREAKFAST AND DINNER   ROSUVASTATIN (CRESTOR) 5 MG TABLET    Take 1 tablet (5 mg total) by mouth daily.   SCOPOLAMINE (TRANSDERM-SCOP) 1 MG/3DAYS    Place 1 patch (1.5 mg total) onto the skin every 3 (three) days.   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   Except as noted above in HPI.   OBJECTIVE:    Vitals:   05/21/22 1359  BP: 112/77  Pulse: 93  Temp: 98.3 F (36.8 C)  TempSrc: Temporal  SpO2: 98%  Weight: 192 lb (87.1 kg)  Height: 5\' 10"  (1.778 m)     Body mass index is 27.55 kg/m.  Physical Exam Constitutional:      Appearance: Normal appearance.     Comments: He is conversational, in good spirits, and very  pleasant.  HENT:     Head: Normocephalic and atraumatic.     Mouth/Throat:     Comments: Tongue with some erythema and fissuring c/w candidiasis. This does appear some improved from the images seen on his phone.  Eyes:     Extraocular Movements: Extraocular movements intact.     Conjunctiva/sclera: Conjunctivae normal.  Pulmonary:     Effort: Pulmonary effort is normal. No respiratory distress.  Musculoskeletal:        General: Normal range of motion.     Cervical back: Normal range of motion and neck supple.  Skin:    General: Skin is warm and dry.  Neurological:     General: No focal deficit present.     Mental Status: He is alert and oriented to person, place, and time.  Psychiatric:        Mood and Affect: Mood normal.        Behavior: Behavior normal.      Labs and Microbiology:     Latest Ref Rng & Units 01/10/2022    8:43 AM 01/10/2021   12:01 PM 02/03/2020    9:14 AM  CBC  WBC 4.0 - 10.5 K/uL 6.7  5.9  6.0   Hemoglobin 13.0 - 17.0 g/dL 16.1  09.6  04.5   Hematocrit 39.0 - 52.0 % 46.2  45.0  48.8   Platelets 150.0 - 400.0 K/uL 236.0  198.0  244.0       Latest Ref Rng & Units 01/10/2022    8:43 AM 01/10/2021   12:01 PM 02/03/2020    9:14 AM  CMP  Glucose 70 - 99 mg/dL 409  95  93   BUN 6 - 23 mg/dL 17  10  17    Creatinine 0.40 - 1.50 mg/dL 8.11  9.14  7.82   Sodium 135 - 145 mEq/L 140  139  138   Potassium 3.5 - 5.1 mEq/L 4.4  3.9  3.9   Chloride 96 - 112 mEq/L 105  103  103   CO2 19 - 32 mEq/L 29  30  29    Calcium 8.4 - 10.5 mg/dL 9.4  9.5  9.3   Total Protein 6.0 - 8.3 g/dL 6.8  6.3    Total Bilirubin 0.2 - 1.2 mg/dL 0.6  0.9    Alkaline Phos 39 - 117 U/L 47  64    AST 0 - 37 U/L 20  28  ALT 0 - 53 U/L 17  29        ASSESSMENT & PLAN:    Oral candidiasis Patient presents with oral candidiasis secondary to antibiotic exposure.  Appears to be improving at this time on fluconazole 400 mg daily.  Recommended to continue this for 14 days as  previously planned given his initial improvement.  Also, considered possible glossitis from doxycycline, however, he reports that the symptoms have developed previously on Bactrim as well as doxycycline so this seems less likely.  We discussed option of secondary prophylaxis with fluconazole 200 mg while on further antibiotic courses in the future to prevent recurrence.  He states that he will try to limit his antibiotic courses but is agreeable to attempt at secondary prophylaxis in the future if needed.  He will call or update [place] with any concerns in the future.     Vedia Coffer for Infectious [facility] 05/22/2022, 1:51 PM.   I have personally spent 30 minutes involved in face-to-face and non-face-to-face activities for this patient on the day of the visit. Professional time spent includes the following activities: Preparing to see the patient (review of tests), Obtaining and/or reviewing separately obtained history (admission/discharge record), Performing a medically appropriate examination and/or evaluation , Ordering medications/tests/procedures, referring and communicating with other health care professionals, Documenting clinical information in the EMR, Independently interpreting results (not separately reported), Communicating results to the patient/family/caregiver, Counseling and educating the patient/family/caregiver and Care coordination (not separately reported).

## 2022-05-22 NOTE — Assessment & Plan Note (Signed)
Patient presents with oral candidiasis secondary to antibiotic exposure.  Appears to be improving at this time on fluconazole 400 mg daily.  Recommended to continue this for 14 days as previously planned given his initial improvement.  Also, considered possible glossitis from doxycycline, however, he reports that the symptoms have developed previously on Bactrim as well as doxycycline so this seems less likely.  We discussed option of secondary prophylaxis with fluconazole 200 mg while on further antibiotic courses in the future to prevent recurrence.  He states that he will try to limit his antibiotic courses but is agreeable to attempt at secondary prophylaxis in the future if needed.  He will call or update [place] with any concerns in the future.

## 2022-05-31 DIAGNOSIS — R5383 Other fatigue: Secondary | ICD-10-CM | POA: Diagnosis not present

## 2022-06-22 ENCOUNTER — Other Ambulatory Visit: Payer: Self-pay | Admitting: Internal Medicine

## 2022-06-22 DIAGNOSIS — F418 Other specified anxiety disorders: Secondary | ICD-10-CM

## 2022-07-04 ENCOUNTER — Other Ambulatory Visit (HOSPITAL_COMMUNITY): Payer: Self-pay

## 2022-07-12 ENCOUNTER — Encounter: Payer: Self-pay | Admitting: Nurse Practitioner

## 2022-07-12 ENCOUNTER — Ambulatory Visit (INDEPENDENT_AMBULATORY_CARE_PROVIDER_SITE_OTHER): Payer: BC Managed Care – PPO | Admitting: Nurse Practitioner

## 2022-07-12 VITALS — BP 110/72 | HR 60 | Temp 98.2°F | Ht 70.0 in | Wt 193.0 lb

## 2022-07-12 DIAGNOSIS — R6883 Chills (without fever): Secondary | ICD-10-CM | POA: Diagnosis not present

## 2022-07-12 DIAGNOSIS — R519 Headache, unspecified: Secondary | ICD-10-CM | POA: Diagnosis not present

## 2022-07-12 LAB — POC COVID19 BINAXNOW: SARS Coronavirus 2 Ag: NEGATIVE

## 2022-07-12 LAB — POCT INFLUENZA A/B
Influenza A, POC: NEGATIVE
Influenza B, POC: NEGATIVE

## 2022-07-12 LAB — POCT RESPIRATORY SYNCYTIAL VIRUS: RSV Rapid Ag: NEGATIVE

## 2022-07-12 NOTE — Assessment & Plan Note (Signed)
Acute, patient is concerned about his family history of brain aneurysms.  No aneurysm noted on imaging in 2018.  Will order repeat MRA for further evaluation.  Also reach out to primary care physician regarding plan for repeat imaging.

## 2022-07-12 NOTE — Progress Notes (Signed)
Established Patient Office Visit  Subjective   Patient ID: Jacob [name], male    DOB: 1980-08-16  Age: 42 y.o. MRN: [id number]  Chief Complaint  Patient presents with   Headache    Fever and chills, freezing cold, body aches, Checked Covid, was negative     Patient has today for the above symptoms.  Reports he has been experiencing chills, body aches, headache for about 1 day.  He has a son who had similar symptoms prior to his symptom onset.  Son has now recovered.  Patient has been taking ibuprofen and Tylenol throughout the day to treat his symptoms.  Denies shortness of breath or cough.  Took an at home COVID test which was negative.    Review of Systems  Constitutional:  Positive for chills, fever and malaise/fatigue.  HENT:  Negative for congestion and sore throat.   Respiratory:  Negative for cough, shortness of breath and wheezing.   Cardiovascular:  Negative for chest pain.  Musculoskeletal:  Positive for myalgias.  Neurological:  Positive for headaches.      Objective:     BP 110/72   Pulse 60   Temp 98.2 F (36.8 C) (Temporal)   Ht 5\' 10"  (1.778 m)   Wt 193 lb (87.5 kg)   SpO2 95%   BMI 27.69 kg/m  BP Readings from Last 3 Encounters:  07/12/22 110/72  05/21/22 112/77  01/10/22 124/76   Wt Readings from Last 3 Encounters:  07/12/22 193 lb (87.5 kg)  05/21/22 192 lb (87.1 kg)  04/11/22 196 lb (88.9 kg)   SpO2 Readings from Last 3 Encounters:  07/12/22 95%  05/21/22 98%  04/11/22 96%      Physical Exam Vitals reviewed.  Constitutional:      Appearance: Normal appearance.  HENT:     Head: Normocephalic and atraumatic.  Cardiovascular:     Rate and Rhythm: Normal rate and regular rhythm.  Pulmonary:     Effort: Pulmonary effort is normal.     Breath sounds: Normal breath sounds.  Musculoskeletal:     Cervical back: Neck supple.  Skin:    General: Skin is warm and dry.  Neurological:     Mental Status: He is alert and oriented to  person, place, and time.  Psychiatric:        Mood and Affect: Mood normal.        Behavior: Behavior normal.        Thought Content: Thought content normal.        Judgment: Judgment normal.      No results found for any visits on 07/12/22.    The 10-year ASCVD risk score (Arnett DK, et al., 2019) is: 0.5%    Assessment & Plan:   Problem List Items Addressed This Visit       Other   Chills - Primary    Acute, VSS Point-of-care COVID, RSV, and flu are all negative today. Recommend treat with hydration, rest, Excedrin.  Call office if symptoms worsen or persist longer than 10 to 14 days.       Relevant Orders   POC COVID-19   POCT Influenza A/B   POCT respiratory syncytial virus   Acute nonintractable headache    Acute, patient is concerned about his family history of brain aneurysms.  No aneurysm noted on imaging in 2018.  Will order repeat MRA for further evaluation.  Also reach out to primary care physician regarding plan for repeat imaging.  Relevant Orders   MR ANGIO HEAD WO CONTRAST    Return if symptoms worsen or fail to improve.    Elenore Paddy, NP

## 2022-07-12 NOTE — Assessment & Plan Note (Addendum)
Acute, VSS Point-of-care COVID, RSV, and flu are all negative today. Recommend treat with hydration, rest, Excedrin.  Call office if symptoms worsen or persist longer than 10 to 14 days.

## 2022-07-12 NOTE — Telephone Encounter (Signed)
This encounter was created in error - please disregard.

## 2022-07-17 ENCOUNTER — Other Ambulatory Visit: Payer: BC Managed Care – PPO

## 2022-08-05 ENCOUNTER — Encounter: Payer: Self-pay | Admitting: Internal Medicine

## 2022-08-24 ENCOUNTER — Other Ambulatory Visit: Payer: Self-pay | Admitting: Internal Medicine

## 2022-08-24 DIAGNOSIS — K140 Glossitis: Secondary | ICD-10-CM | POA: Insufficient documentation

## 2022-08-24 MED ORDER — DEXAMETHASONE 0.5 MG/5ML PO SOLN
0.5000 mg | Freq: Two times a day (BID) | ORAL | 0 refills | Status: DC
Start: 2022-08-24 — End: 2022-12-18

## 2022-08-27 DIAGNOSIS — E785 Hyperlipidemia, unspecified: Secondary | ICD-10-CM | POA: Diagnosis not present

## 2022-08-27 DIAGNOSIS — E039 Hypothyroidism, unspecified: Secondary | ICD-10-CM | POA: Diagnosis not present

## 2022-08-27 DIAGNOSIS — E2740 Unspecified adrenocortical insufficiency: Secondary | ICD-10-CM | POA: Diagnosis not present

## 2022-08-27 DIAGNOSIS — E291 Testicular hypofunction: Secondary | ICD-10-CM | POA: Diagnosis not present

## 2022-08-27 DIAGNOSIS — E538 Deficiency of other specified B group vitamins: Secondary | ICD-10-CM | POA: Diagnosis not present

## 2022-08-27 DIAGNOSIS — E559 Vitamin D deficiency, unspecified: Secondary | ICD-10-CM | POA: Diagnosis not present

## 2022-09-02 ENCOUNTER — Ambulatory Visit (INDEPENDENT_AMBULATORY_CARE_PROVIDER_SITE_OTHER): Payer: BC Managed Care – PPO | Admitting: Internal Medicine

## 2022-09-02 ENCOUNTER — Other Ambulatory Visit: Payer: Self-pay

## 2022-09-02 ENCOUNTER — Encounter: Payer: Self-pay | Admitting: Internal Medicine

## 2022-09-02 VITALS — BP 120/78 | HR 67 | Temp 98.9°F | Ht 70.0 in | Wt 197.0 lb

## 2022-09-02 DIAGNOSIS — K140 Glossitis: Secondary | ICD-10-CM

## 2022-09-02 NOTE — Progress Notes (Signed)
RFV: follow up for candidal glossitis  Patient ID: ESROM SOY, male   DOB: Nov 10, 1980, 42 y.o.   MRN: [id number]  HPI Revell is a 42yo M in hx of longstanding glossitis. He previously was seeing Dr Earlene Plater. He states that he had long standing exposure to bactrim for adolescent/young adult acne and thereafter started to have intermittent case of thrush over the years. Most recently he had a short course of doxycycline with sequelae of thrush, he was treated by dr Yetta Barre withthen fluconazole 400mg  x 21 days, stopped . Now doing clotrimazole ; plus 10 day rinse of decadron.  Hasn't been on any other antibiotics for the past- 2 months. He reports whitish tongue " cat tongue" some of which scrapes off.  See dermatology specialist --   Soc hx: 3 kids ages: 42, 108, 79 months; wife is cardiology PA at [employer]  Outpatient Encounter Medications as of 09/02/2022  Medication Sig   clomiPHENE (CLOMID) 50 MG tablet Take 50 mg by mouth. 50 mg M,W,F and 25 mg T,Th,S,Su   DULoxetine (CYMBALTA) 30 MG capsule TAKE 1 CAPSULE (30 MG TOTAL) BY MOUTH DAILY. MUST KEEP APPT FOR FUTURE REFILLS   levocetirizine (XYZAL) 5 MG tablet TAKE 2 TABLETS (10 MG TOTAL) BY MOUTH AT BEDTIME.   pantoprazole (PROTONIX) 40 MG tablet TAKE ONE TABLET BY MOUTH 30 TO 60 MINUTES BEFORE BREAKFAST AND DINNER   rosuvastatin (CRESTOR) 5 MG tablet Take 1 tablet (5 mg total) by mouth daily.   dexamethasone (DECADRON) 0.5 MG/5ML solution Take 5 mLs (0.5 mg total) by mouth 2 (two) times daily. Swish and spit BID   fluconazole (DIFLUCAN) 200 MG tablet Take 200 mg by mouth 2 (two) times daily. (Patient not taking: Reported on 09/02/2022)   scopolamine (TRANSDERM-SCOP) 1 MG/3DAYS Place 1 patch (1.5 mg total) onto the skin every 3 (three) days. (Patient not taking: Reported on 09/02/2022)   UNABLE TO FIND Three grains nature thyroid (Patient not taking: Reported on 09/02/2022)   No facility-administered encounter medications on file as of  09/02/2022.     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   Chills 07/12/2022   Acute nonintractable headache 07/12/2022   Allergic conjunctivitis of both eyes 04/29/2022   Red tongue 01/11/2022   Oral candidiasis 01/11/2022   Gastroesophageal reflux disease without esophagitis 01/10/2022   Motion sickness 11/15/2021   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   Rotator cuff tendinitis, right 01/18/2021   Essential hypertension 12/10/2019   Tinea pedis of both feet 12/03/2019   Lateral epicondylitis of right elbow 05/01/2017   Family history of brain aneurysm 11/05/2016   Renal cyst, left 03/24/2016   Routine general medical examination at a health care facility 11/22/2015   Hypothyroid      Health Maintenance Due  Topic Date Due   HPV VACCINES (3 - Risk 3-dose SCDM series) 08/08/2019   COVID-19 Vaccine (5 - 2023-24 season) 12/10/2021   INFLUENZA VACCINE  08/22/2022     Review of Systems 12 point ros is negative except what is mentioned above Physical Exam   BP 120/78   Pulse 67   Temp 98.9 F (37.2 C) (Temporal)   Ht 5\' 10"  (1.778 m)   Wt 197 lb (89.4 kg)   SpO2 98%   BMI 28.27 kg/m   GEN = a x o by 3 in nad HEENT = [place]/AT PERRLA, whitish tongue only not on soft or hard palate Lymphadenopathy = No LAD Lab Results  Component  Value Date   CD4TCELL 43 04/02/2010   Lab Results  Component Value Date   CD4TABS 1260 04/02/2010   Lab Results  Component Value Date   HIV1RNAQUANT <20 copies/mL 04/02/2010   CBC Lab Results  Component Value Date   WBC 6.7 01/10/2022   RBC 4.95 01/10/2022   HGB 15.8 01/10/2022   HCT 46.2 01/10/2022   PLT 236.0 01/10/2022   MCV 93.4 01/10/2022   MCHC 34.1 01/10/2022   RDW 12.6 01/10/2022   LYMPHSABS 2.8 01/10/2022   MONOABS 0.7 01/10/2022   EOSABS 0.1 01/10/2022    BMET Lab Results  Component Value Date   NA 140 01/10/2022   K 4.4 01/10/2022   CL 105 01/10/2022   CO2 29 01/10/2022    GLUCOSE 100 (H) 01/10/2022   BUN 17 01/10/2022   CREATININE 1.09 01/10/2022   CALCIUM 9.4 01/10/2022   GFRNONAA 125 03/10/2006   GFRAA 151 03/10/2006      Assessment and Plan  Will hold off on antifungal treatment to see if will continue to balance out off of abtx, off of steroids rinse.  If worsens, will culture to see if any fluconazole drug resistance. Will send message to patient to come in for cx in the next 2-3 weeks if not improved  I have personally spent 20 minutes involved in face-to-face and non-face-to-face activities for this patient on the day of the visit. Professional time spent includes the following activities: Preparing to see the patient (review of tests), Obtaining and/or reviewing separately obtained history (admission/discharge record), Performing a medically appropriate examination and/or evaluation , Ordering medications/tests/procedures, referring and communicating with other health care professionals, Documenting clinical information in the EMR, Independently interpreting results (not separately reported), Communicating results to the patient/family/caregiver, Counseling and educating the patient/family/caregiver and Care coordination (not separately reported).

## 2022-09-11 ENCOUNTER — Telehealth: Payer: Self-pay

## 2022-09-11 NOTE — Telephone Encounter (Signed)
Per Dr. Drue Second called patient for update on white coating on tongue. Per MD if coating remains or gets worse to come back to office for culture.  Spoke with patient who states that coating is almost gone. Understands to call if it gets worse to schedule appt.  Juanita Laster, RMA

## 2022-09-12 ENCOUNTER — Other Ambulatory Visit (HOSPITAL_COMMUNITY): Payer: Self-pay

## 2022-09-12 DIAGNOSIS — R5383 Other fatigue: Secondary | ICD-10-CM | POA: Diagnosis not present

## 2022-09-12 DIAGNOSIS — E039 Hypothyroidism, unspecified: Secondary | ICD-10-CM | POA: Diagnosis not present

## 2022-09-12 DIAGNOSIS — E291 Testicular hypofunction: Secondary | ICD-10-CM | POA: Diagnosis not present

## 2022-09-12 DIAGNOSIS — K219 Gastro-esophageal reflux disease without esophagitis: Secondary | ICD-10-CM | POA: Diagnosis not present

## 2022-09-12 MED ORDER — THYROID 30 MG PO TABS
30.0000 mg | ORAL_TABLET | Freq: Every morning | ORAL | 1 refills | Status: DC
  Filled 2022-09-12: qty 90, 90d supply, fill #0

## 2022-09-12 MED ORDER — CLOMID 50 MG PO TABS
50.0000 mg | ORAL_TABLET | Freq: Every day | ORAL | 1 refills | Status: AC
  Filled 2022-09-12: qty 90, 90d supply, fill #0

## 2022-09-12 MED ORDER — THYROID 120 MG PO TABS
120.0000 mg | ORAL_TABLET | Freq: Every morning | ORAL | 3 refills | Status: DC
  Filled 2022-09-12: qty 90, 90d supply, fill #0
  Filled 2022-11-25: qty 90, 90d supply, fill #1
  Filled 2023-02-06 – 2023-02-17 (×3): qty 90, 90d supply, fill #2
  Filled 2023-05-16 – 2023-05-19 (×2): qty 90, 90d supply, fill #3

## 2022-09-12 MED ORDER — THYROID 30 MG PO TABS
30.0000 mg | ORAL_TABLET | Freq: Every morning | ORAL | 3 refills | Status: DC
  Filled 2022-09-12: qty 90, 90d supply, fill #0
  Filled 2022-11-25: qty 90, 90d supply, fill #1
  Filled 2023-02-06 – 2023-02-17 (×3): qty 90, 90d supply, fill #2
  Filled 2023-05-16 – 2023-05-19 (×2): qty 90, 90d supply, fill #3

## 2022-09-19 ENCOUNTER — Other Ambulatory Visit: Payer: Self-pay | Admitting: Internal Medicine

## 2022-09-19 DIAGNOSIS — F418 Other specified anxiety disorders: Secondary | ICD-10-CM

## 2022-10-03 ENCOUNTER — Encounter: Payer: Self-pay | Admitting: Sports Medicine

## 2022-10-03 NOTE — Telephone Encounter (Signed)
Messaged pt via mychart to make an appointment

## 2022-10-16 ENCOUNTER — Other Ambulatory Visit: Payer: Self-pay | Admitting: Internal Medicine

## 2022-10-16 DIAGNOSIS — K219 Gastro-esophageal reflux disease without esophagitis: Secondary | ICD-10-CM

## 2022-10-16 MED ORDER — PANTOPRAZOLE SODIUM 40 MG PO TBEC: 40.0000 mg | DELAYED_RELEASE_TABLET | Freq: Every day | ORAL | 0 refills | Status: DC

## 2022-11-06 DIAGNOSIS — L719 Rosacea, unspecified: Secondary | ICD-10-CM | POA: Diagnosis not present

## 2022-11-06 DIAGNOSIS — L814 Other melanin hyperpigmentation: Secondary | ICD-10-CM | POA: Diagnosis not present

## 2022-11-06 DIAGNOSIS — D225 Melanocytic nevi of trunk: Secondary | ICD-10-CM | POA: Diagnosis not present

## 2022-11-06 DIAGNOSIS — L578 Other skin changes due to chronic exposure to nonionizing radiation: Secondary | ICD-10-CM | POA: Diagnosis not present

## 2022-11-14 ENCOUNTER — Encounter: Payer: Self-pay | Admitting: Internal Medicine

## 2022-11-15 NOTE — Telephone Encounter (Signed)
Per Dr. Drue Second:  Can you let him know to take fluconazole only if he thinks thrush is coming back. The abtx he is taking now may not cause another episode of thrush. I would wait and see if it re occurs.

## 2022-11-25 ENCOUNTER — Other Ambulatory Visit (HOSPITAL_COMMUNITY): Payer: Self-pay

## 2022-12-03 ENCOUNTER — Ambulatory Visit: Payer: BC Managed Care – PPO | Attending: Internal Medicine | Admitting: Internal Medicine

## 2022-12-03 ENCOUNTER — Encounter: Payer: Self-pay | Admitting: Internal Medicine

## 2022-12-03 VITALS — BP 100/84 | HR 68 | Ht 70.0 in | Wt 180.2 lb

## 2022-12-03 DIAGNOSIS — Z8249 Family history of ischemic heart disease and other diseases of the circulatory system: Secondary | ICD-10-CM

## 2022-12-03 DIAGNOSIS — E7841 Elevated Lipoprotein(a): Secondary | ICD-10-CM

## 2022-12-03 DIAGNOSIS — R072 Precordial pain: Secondary | ICD-10-CM | POA: Diagnosis not present

## 2022-12-03 DIAGNOSIS — I10 Essential (primary) hypertension: Secondary | ICD-10-CM

## 2022-12-03 DIAGNOSIS — Z8342 Family history of familial hypercholesterolemia: Secondary | ICD-10-CM

## 2022-12-03 DIAGNOSIS — E785 Hyperlipidemia, unspecified: Secondary | ICD-10-CM

## 2022-12-03 MED ORDER — METOPROLOL TARTRATE 50 MG PO TABS: 50.0000 mg | ORAL_TABLET | Freq: Once | ORAL | 0 refills | Status: DC

## 2022-12-03 NOTE — Patient Instructions (Addendum)
Medication Instructions:  Your physician recommends that you continue on your current medications as directed. Please refer to the Current Medication list given to you today.  *If you need a refill on your cardiac medications before your next appointment, please call your pharmacy*   Lab Work: Lipid Panel and CMET Tomorrow (Fasting) If you have labs (blood work) drawn today and your tests are completely normal, you will receive your results only by: MyChart Message (if you have MyChart) OR A paper copy in the mail If you have any lab test that is abnormal or we need to change your treatment, we will call you to review the results.   Testing/Procedures:   Your cardiac CT will be scheduled at the below locations:   [facility] [address] [place], [place] [address] [phone]   If scheduled at [facility], please arrive at the [facility]'s Entrance (Entrance C2) of [facility] 30 minutes prior to test start time. You can use the FREE valet parking offered at entrance C (encouraged to control the heart rate for the test)  Proceed to the [facility] Radiology Department (first floor) to check-in and test prep.  All radiology patients and guests should use entrance C2 at [facility], accessed from [facility], even though the hospital's physical address listed is [address].    There is spacious parking and easy access to the radiology department from the [facility] Heart and Vascular entrance. Please enter here and check-in with the desk attendant.   Please follow these instructions carefully (unless otherwise directed):  An IV will be required for this test and Nitroglycerin will be given.  Hold all erectile dysfunction medications at least 3 days (72 hrs) prior to test. (Ie viagra, cialis, sildenafil, tadalafil, etc)   On the Night Before the Test: Be sure to Drink plenty of water. Do not consume any  caffeinated/decaffeinated beverages or chocolate 12 hours prior to your test. Do not take any antihistamines 12 hours prior to your test.  On the Day of the Test: Drink plenty of water until 1 hour prior to the test. Do not eat any food 1 hour prior to test. You may take your regular medications prior to the test. Take Metoprolol tartrate (Lopressor) 50 mg (1 tablet) 2 hours prior to your CT  If you take Furosemide/Hydrochlorothiazide/Spironolactone, please HOLD on the morning of the test.  After the Test: Drink plenty of water. After receiving IV contrast, you may experience a mild flushed feeling. This is normal. On occasion, you may experience a mild rash up to 24 hours after the test. This is not dangerous. If this occurs, you can take Benadryl 25 mg and increase your fluid intake. If you experience trouble breathing, this can be serious. If it is severe call 911 IMMEDIATELY. If it is mild, please call our office. If you take any of these medications: Glipizide/Metformin, Avandament, Glucavance, please do not take 48 hours after completing test unless otherwise instructed.  We will call to schedule your test 2-4 weeks out understanding that some insurance companies will need an authorization prior to the service being performed.   For more information and frequently asked questions, please visit our website : [email or web address]  For non-scheduling related questions, please contact the cardiac imaging nurse navigator should you have any questions/concerns: Cardiac Imaging Nurse Navigators Direct Office Dial: [phone]   For scheduling needs, including cancellations and rescheduling, please call [place], [phone].  Follow-Up: At [facility], you and your health needs are our priority.  As part of our continuing mission to provide you with exceptional heart care, we have created designated Provider Care Teams.  These Care Teams include your primary  Cardiologist (physician) and Advanced Practice Providers (APPs -  Physician Assistants and Nurse Practitioners) who all work together to provide you with the care you need, when you need it.  Your next appointment:   12 month(s)  Provider:   Parke Poisson, MD

## 2022-12-03 NOTE — Progress Notes (Signed)
Cardiology Office Note:  .   Date:  12/03/2022  ID:  Jacob [name], DOB 1980-12-31, MRN [id number] PCP: Etta Grandchild, MD  [facility] Providers Cardiologist:  Parke Poisson, MD    History of Present Illness: .   Jacob [name] is a 42 y.o. male.  Discussed the use of AI scribe software for clinical note transcription with the patient, who gave verbal consent to proceed.  History of Present Illness   The patient, with a family history of heart disease, presents with occasional substernal chest discomfort, which he believes may be due to reflux but can happen anytime. He is currently on Crestor 5mg  for cholesterol management and has noticed a significant improvement in his cholesterol levels since starting the medication. The patient has also been taking tirzepatide for weight management and has noticed a significant decrease in his weight and a change in his appetite since starting the medication. The patient has expressed concerns about the potential side effects of increasing the dose of Crestor, including muscle pain. He has also expressed concerns about the potential risks of starting aspirin, including bleeding risk.        ROS: negative except per HPI above.  Studies Reviewed: Marland Kitchen   EKG Interpretation Date/Time:  Tuesday December 03 2022 10:02:22 EST Ventricular Rate:  68 PR Interval:  162 QRS Duration:  84 QT Interval:  396 QTC Calculation: 421 R Axis:   82  Text Interpretation: Normal sinus rhythm Normal ECG Confirmed by Weston Brass ([id number]) on 12/03/2022 10:16:15 AM    Results   LABS LDL: 68 (2023) HDL: 55 (2023) Triglycerides: 75 (2023)  RADIOLOGY Calcium score: 0 (2021)     Risk Assessment/Calculations:             Physical Exam:   VS:  BP 100/84 (BP Location: Left Arm, Patient Position: Sitting, Cuff Size: Normal)   Pulse 68   Ht 5\' 10"  (1.778 m)   Wt 180 lb 3.2 oz (81.7 kg)   SpO2 98%   BMI 25.86 kg/m    Wt Readings from  Last 3 Encounters:  12/03/22 180 lb 3.2 oz (81.7 kg)  09/02/22 197 lb (89.4 kg)  07/12/22 193 lb (87.5 kg)     Physical Exam   MEASUREMENTS: WT- 180     GEN: Well nourished, well developed in no acute distress NECK: No JVD; No carotid bruits CARDIAC: RRR, no murmurs, rubs, gallops RESPIRATORY:  Clear to auscultation without rales, wheezing or rhonchi  ABDOMEN: Soft, non-tender, non-distended EXTREMITIES:  No edema; No deformity   ASSESSMENT AND PLAN: .    1. Precordial pain   2. Elevated lipoprotein(a)   3. Family history of high cholesterol   4. Family history of early CAD   5. Hyperlipidemia, unspecified hyperlipidemia type     Assessment and Plan    Hyperlipidemia with elevated Lipoprotein(a) Chest pain, possible cardiac chest pain Family history of coronary artery disease. Currently on Crestor 5mg  with last LDL of 68. Discussed the potential benefit of increasing statin therapy or adding Repatha, but patient reported possible statin-induced myalgia with higher Crestor dose. -Check lipid panel today. -Consider coronary CT scan for intermittent chest discomfort to assess for coronary plaque.  Weight loss Patient reported significant weight loss while on Tirzepatide. -Continue Tirzepatide as tolerated.  Aspirin therapy Discussed the potential benefit of aspirin therapy given family history and elevated Lp(a), but also the risk of bleeding, especially with upcoming skiing trip. -Defer aspirin therapy at this time.  Follow-up -Review results of lipid panel and carotid scan.                Total time of encounter: 25 minutes total time of encounter, including 15 minutes spent in face-to-face patient care on the date of this encounter. This time includes coordination of care and counseling regarding above mentioned problem list. Remainder of non-face-to-face time involved reviewing chart documents/testing relevant to the patient encounter and documentation in the  medical record. I have independently reviewed documentation from referring provider.   Weston Brass, MD, [facility]  [facility]

## 2022-12-05 NOTE — Addendum Note (Signed)
Addended by: Morrell Riddle on: 12/05/2022 08:07 AM   Modules accepted: Orders

## 2022-12-06 DIAGNOSIS — I10 Essential (primary) hypertension: Secondary | ICD-10-CM | POA: Diagnosis not present

## 2022-12-06 DIAGNOSIS — Z8342 Family history of familial hypercholesterolemia: Secondary | ICD-10-CM | POA: Diagnosis not present

## 2022-12-06 DIAGNOSIS — R072 Precordial pain: Secondary | ICD-10-CM | POA: Diagnosis not present

## 2022-12-06 DIAGNOSIS — E785 Hyperlipidemia, unspecified: Secondary | ICD-10-CM | POA: Diagnosis not present

## 2022-12-06 LAB — COMPREHENSIVE METABOLIC PANEL
ALT: 19 [IU]/L (ref 0–44)
AST: 28 [IU]/L (ref 0–40)
Albumin: 4.4 g/dL (ref 4.1–5.1)
Alkaline Phosphatase: 59 [IU]/L (ref 44–121)
BUN/Creatinine Ratio: 17 (ref 9–20)
BUN: 20 mg/dL (ref 6–24)
Bilirubin Total: 0.6 mg/dL (ref 0.0–1.2)
CO2: 26 mmol/L (ref 20–29)
Calcium: 9.1 mg/dL (ref 8.7–10.2)
Chloride: 103 mmol/L (ref 96–106)
Creatinine, Ser: 1.19 mg/dL (ref 0.76–1.27)
Globulin, Total: 2.2 g/dL (ref 1.5–4.5)
Glucose: 92 mg/dL (ref 70–99)
Potassium: 4.6 mmol/L (ref 3.5–5.2)
Sodium: 140 mmol/L (ref 134–144)
Total Protein: 6.6 g/dL (ref 6.0–8.5)
eGFR: 78 mL/min/{1.73_m2} (ref >59)

## 2022-12-06 LAB — LIPID PANEL
Chol/HDL Ratio: 2.4 ratio (ref 0.0–5.0)
Cholesterol, Total: 166 mg/dL (ref 100–199)
HDL: 68 mg/dL (ref >39)
LDL Chol Calc (NIH): 84 mg/dL (ref 0–99)
Triglycerides: 73 mg/dL (ref 0–149)
VLDL Cholesterol Cal: 14 mg/dL (ref 5–40)

## 2022-12-07 LAB — CBC
Hematocrit: 49.4 % (ref 37.5–51.0)
Hemoglobin: 16.2 g/dL (ref 13.0–17.7)
MCH: 31.5 pg (ref 26.6–33.0)
MCHC: 32.8 g/dL (ref 31.5–35.7)
MCV: 96 fL (ref 79–97)
Platelets: 189 10*3/uL (ref 150–450)
RBC: 5.15 x10E6/uL (ref 4.14–5.80)
RDW: 12.5 % (ref 11.6–15.4)
WBC: 5.6 10*3/uL (ref 3.4–10.8)

## 2022-12-07 LAB — HEMOGLOBIN A1C
Est. average glucose Bld gHb Est-mCnc: 108 mg/dL
Hgb A1c MFr Bld: 5.4 % (ref 4.8–5.6)

## 2022-12-13 ENCOUNTER — Encounter (HOSPITAL_COMMUNITY): Payer: Self-pay

## 2022-12-17 ENCOUNTER — Ambulatory Visit (HOSPITAL_COMMUNITY)
Admission: RE | Admit: 2022-12-17 | Discharge: 2022-12-17 | Disposition: A | Discharge status: Home / Self Care | Payer: BC Managed Care – PPO | Source: Ambulatory Visit | Attending: Internal Medicine | Admitting: Internal Medicine

## 2022-12-17 DIAGNOSIS — R072 Precordial pain: Secondary | ICD-10-CM | POA: Insufficient documentation

## 2022-12-17 MED ORDER — SODIUM CHLORIDE 0.9 % IV SOLN: Freq: Once | INTRAVENOUS | Status: AC

## 2022-12-17 MED ORDER — NITROGLYCERIN 0.4 MG SL SUBL
SUBLINGUAL_TABLET | SUBLINGUAL | Status: AC
  Filled 2022-12-17: qty 2

## 2022-12-17 MED ORDER — NITROGLYCERIN 0.4 MG SL SUBL
0.8000 mg | SUBLINGUAL_TABLET | Freq: Once | SUBLINGUAL | Status: AC
  Administered 2022-12-17: 0.8 mg via SUBLINGUAL

## 2022-12-17 MED ORDER — IOHEXOL 350 MG/ML SOLN
100.0000 mL | Freq: Once | INTRAVENOUS | Status: AC | PRN
  Administered 2022-12-17: 100 mL via INTRAVENOUS

## 2022-12-17 NOTE — Progress Notes (Signed)
While attempting to insert PIV, pt became diaphoretic and nauseous with soft BP, see flowsheets. Another PIV obtained, fluids started. Improvement on reassessment. Pt also stated feeling "tired" when coming in from waiting room.

## 2022-12-18 ENCOUNTER — Telehealth: Payer: Self-pay

## 2022-12-18 MED ORDER — ROSUVASTATIN CALCIUM 10 MG PO TABS: 10.0000 mg | ORAL_TABLET | Freq: Every day | ORAL | 4 refills | Status: AC

## 2022-12-18 NOTE — Telephone Encounter (Signed)
Per Dr. Jacques Navy, increase crestor to 10mg  once daily. Dr. Jacques Navy, also recommends CoQ 10- 100mg  tablets to take daily. Prescription sent to pt's preferred pharmacy. Mychart message sent to pt.

## 2023-01-02 ENCOUNTER — Ambulatory Visit: Payer: BC Managed Care – PPO | Admitting: Internal Medicine

## 2023-02-03 ENCOUNTER — Ambulatory Visit: Payer: BC Managed Care – PPO | Admitting: Internal Medicine

## 2023-02-03 ENCOUNTER — Encounter: Payer: Self-pay | Admitting: Internal Medicine

## 2023-02-03 VITALS — BP 128/78 | HR 60 | Temp 98.4°F | Resp 16 | Ht 70.0 in | Wt 179.2 lb

## 2023-02-03 DIAGNOSIS — R519 Headache, unspecified: Secondary | ICD-10-CM

## 2023-02-03 DIAGNOSIS — E7841 Elevated Lipoprotein(a): Secondary | ICD-10-CM

## 2023-02-03 DIAGNOSIS — K219 Gastro-esophageal reflux disease without esophagitis: Secondary | ICD-10-CM | POA: Diagnosis not present

## 2023-02-03 DIAGNOSIS — E039 Hypothyroidism, unspecified: Secondary | ICD-10-CM

## 2023-02-03 DIAGNOSIS — D225 Melanocytic nevi of trunk: Secondary | ICD-10-CM | POA: Insufficient documentation

## 2023-02-03 DIAGNOSIS — R140 Abdominal distension (gaseous): Secondary | ICD-10-CM | POA: Insufficient documentation

## 2023-02-03 DIAGNOSIS — Z0000 Encounter for general adult medical examination without abnormal findings: Secondary | ICD-10-CM

## 2023-02-03 DIAGNOSIS — Z0001 Encounter for general adult medical examination with abnormal findings: Secondary | ICD-10-CM | POA: Insufficient documentation

## 2023-02-03 DIAGNOSIS — N281 Cyst of kidney, acquired: Secondary | ICD-10-CM | POA: Diagnosis not present

## 2023-02-03 DIAGNOSIS — L578 Other skin changes due to chronic exposure to nonionizing radiation: Secondary | ICD-10-CM | POA: Insufficient documentation

## 2023-02-03 DIAGNOSIS — L814 Other melanin hyperpigmentation: Secondary | ICD-10-CM | POA: Insufficient documentation

## 2023-02-03 LAB — URINALYSIS, ROUTINE W REFLEX MICROSCOPIC
Bilirubin Urine: NEGATIVE
Hgb urine dipstick: NEGATIVE
Ketones, ur: NEGATIVE
Leukocytes,Ua: NEGATIVE
Nitrite: NEGATIVE
RBC / HPF: NONE SEEN (ref 0–?)
Specific Gravity, Urine: <=1.005 — AB (ref 1.000–1.030)
Total Protein, Urine: NEGATIVE
Urine Glucose: NEGATIVE
Urobilinogen, UA: 0.2 (ref 0.0–1.0)
WBC, UA: NONE SEEN (ref 0–?)
pH: 6.5 (ref 5.0–8.0)

## 2023-02-03 LAB — VITAMIN B12: Vitamin B-12: 1012 pg/mL — ABNORMAL HIGH (ref 211–911)

## 2023-02-03 MED ORDER — VOQUEZNA 10 MG PO TABS
1.0000 | ORAL_TABLET | Freq: Every day | ORAL | 1 refills | Status: DC
Start: 2023-02-03 — End: 2023-05-22

## 2023-02-03 NOTE — Progress Notes (Signed)
 Subjective:  Patient ID: Jacob [name], male    DOB: 01-10-1981  Age: 43 y.o. MRN: [id number]  CC: Annual Exam, Gastroesophageal Reflux, and Hypothyroidism   HPI Jacob [name] presents for a CPX and f/up ---  Discussed the use of AI scribe software for clinical note transcription with the patient, who gave verbal consent to proceed.  History of Present Illness   The patient, with a history of gastroesophageal reflux disease (GERD), began a regimen of tirzepatide for weight loss approximately four months ago. The decision to start this medication was influenced by family members' comments about the patient's weight gain. The patient reports a significant decrease in appetite and subsequent weight loss since starting the medication. However, he also reports experiencing constipation and an exacerbation of his pre-existing reflux symptoms. The patient occasionally uses Dulcolax or Miralax to manage the constipation. Despite taking Protonix , the patient still experiences heartburn approximately once a week, sometimes necessitating a second daily dose of the medication.  He is currently on rosuvastatin  for elevated lipoprotein(a) levels. The patient is interested in rechecking these levels to assess the response to statin therapy. The patient also expresses concern about the long-term use of Proton Pump Inhibitors (PPIs) due to potential side effects and is open to trying a new acid reducer.       Outpatient Medications Prior to Visit  Medication Sig Dispense Refill   clomiPHENE  (CLOMID ) 50 MG tablet Take 1 tablet (50 mg total) by mouth daily. 90 tablet 1   DULoxetine  (CYMBALTA ) 30 MG capsule TAKE 1 CAPSULE (30 MG TOTAL) BY MOUTH DAILY. MUST KEEP APPT FOR FUTURE REFILLS 90 capsule 1   levocetirizine (XYZAL ) 5 MG tablet TAKE 2 TABLETS (10 MG TOTAL) BY MOUTH AT BEDTIME. 180 tablet 1   rosuvastatin  (CRESTOR ) 10 MG tablet Take 1 tablet (10 mg total) by mouth daily. 90 tablet 4   thyroid   (NP THYROID ) 120 MG tablet Take 1 tablet (120 mg total) by mouth every morning, along with 30mg  tablet to equal 150mg  daily 90 tablet 3   thyroid  (NP THYROID ) 30 MG tablet Take 1 tablet (30 mg total) by mouth every morning, along with 120mg  tablet to equal 150mg  daily 90 tablet 3   pantoprazole  (PROTONIX ) 40 MG tablet Take 1 tablet (40 mg total) by mouth daily. Take one tablet by mouth 30 to 60 minutes before breakfast and dinner 90 tablet 0   metoprolol  tartrate (LOPRESSOR ) 50 MG tablet Take 1 tablet (50 mg total) by mouth once for 1 dose. Take 1 tablet (50 mg total) once, two hours prior to your Coronary CT Scan 1 tablet 0   No facility-administered medications prior to visit.    ROS Review of Systems  Constitutional: Negative.  Negative for diaphoresis, fatigue and unexpected weight change.  HENT:  Negative for trouble swallowing.   Eyes: Negative.   Respiratory:  Negative for cough, chest tightness, shortness of breath and wheezing.   Cardiovascular:  Negative for chest pain, palpitations and leg swelling.  Gastrointestinal: Negative.  Negative for abdominal pain, constipation, diarrhea, nausea and vomiting.  Endocrine: Negative.   Genitourinary: Negative.  Negative for difficulty urinating, penile swelling and scrotal swelling.  Musculoskeletal: Negative.   Skin: Negative.   Neurological: Negative.  Negative for dizziness and weakness.  Hematological:  Negative for adenopathy. Does not bruise/bleed easily.  Psychiatric/Behavioral: Negative.      Objective:  BP 128/78 (BP Location: Left Arm, Patient Position: Sitting, Cuff Size: Normal)   Pulse 60  Temp 98.4 F (36.9 C) (Oral)   Resp 16   Ht 5' 10 (1.778 m)   Wt 179 lb 3.2 oz (81.3 kg)   SpO2 93%   BMI 25.71 kg/m   BP Readings from Last 3 Encounters:  02/03/23 128/78  12/17/22 116/66  12/03/22 100/84    Wt Readings from Last 3 Encounters:  02/03/23 179 lb 3.2 oz (81.3 kg)  12/03/22 180 lb 3.2 oz (81.7 kg)  09/02/22  197 lb (89.4 kg)    Physical Exam Vitals reviewed.  Constitutional:      Appearance: Normal appearance.  HENT:     Nose: Nose normal.     Mouth/Throat:     Mouth: Mucous membranes are moist.  Eyes:     General: No scleral icterus.    Conjunctiva/sclera: Conjunctivae normal.  Cardiovascular:     Rate and Rhythm: Normal rate and regular rhythm.     Heart sounds: No murmur heard.    No friction rub. No gallop.  Pulmonary:     Effort: Pulmonary effort is normal.     Breath sounds: No stridor. No wheezing, rhonchi or rales.  Abdominal:     General: Abdomen is flat.     Palpations: There is no mass.     Tenderness: There is no abdominal tenderness. There is no guarding.     Hernia: No hernia is present.  Musculoskeletal:        General: Normal range of motion.     Cervical back: Neck supple.     Right lower leg: No edema.     Left lower leg: No edema.  Lymphadenopathy:     Cervical: No cervical adenopathy.  Skin:    General: Skin is warm and dry.  Neurological:     General: No focal deficit present.     Mental Status: He is alert. Mental status is at baseline.  Psychiatric:        Mood and Affect: Mood normal.        Behavior: Behavior normal.     Lab Results  Component Value Date   WBC 5.6 12/06/2022   HGB 16.2 12/06/2022   HCT 49.4 12/06/2022   PLT 189 12/06/2022   GLUCOSE 92 12/06/2022   CHOL 166 12/06/2022   TRIG 73 12/06/2022   HDL 68 12/06/2022   LDLCALC 84 12/06/2022   ALT 19 12/06/2022   AST 28 12/06/2022   NA 140 12/06/2022   K 4.6 12/06/2022   CL 103 12/06/2022   CREATININE 1.19 12/06/2022   BUN 20 12/06/2022   CO2 26 12/06/2022   TSH 0.14 (L) 02/03/2023   PSA 0.53 01/10/2021   HGBA1C 5.4 12/06/2022    CT CORONARY MORPH W/CTA COR W/SCORE W/CA W/CM &/OR WO/CM Addendum Date: 12/31/2022 ADDENDUM REPORT: 12/31/2022 23:56 EXAM: OVER-READ INTERPRETATION  CT CHEST The following report is an over-read performed by radiologist Dr. Suzen Dials of  [facility] on 12/31/2022. This over-read does not include interpretation of cardiac or coronary anatomy or pathology. The coronary calcium  score/coronary CTA interpretation by the cardiologist is attached. COMPARISON:  March 18, 2019 FINDINGS: Cardiovascular: There are no significant extracardiac vascular findings. Mediastinum/Nodes: There are no enlarged lymph nodes within the visualized mediastinum. Lungs/Pleura: There is no pleural effusion. The visualized lungs appear clear. Upper abdomen: No significant findings in the visualized upper abdomen. Musculoskeletal/Chest wall: No chest wall mass or suspicious osseous findings within the visualized chest. IMPRESSION: No significant extracardiac findings within the visualized chest. Electronically Signed  By: Suzen Dials M.D.   On: 12/31/2022 23:56   Result Date: 12/31/2022 CLINICAL DATA:  43 year old with chest pain EXAM: Cardiac/Coronary  CTA TECHNIQUE: The patient was scanned on a [employer]. FINDINGS: A 100 kV prospective scan was triggered in the descending thoracic aorta at 111 HU's. Axial non-contrast 3 mm slices were carried out through the heart. The data set was analyzed on a dedicated work station and scored using the Agatson method. Gantry rotation speed was 250 msecs and collimation was .6 mm. 0.8 mg of sl NTG was given. The 3D data set was reconstructed in 5% intervals of the 67-82 % of the R-R cycle. Diastolic phases were analyzed on a dedicated work station using MPR, MIP and VRT modes. The patient received 80 cc of contrast. Image quality: good Aorta:  Normal size (28 mm).  No calcifications.  No dissection. Aortic Valve:  Trileaflet.  No calcifications. Coronary Arteries:  Normal coronary origin.  Right dominance. RCA is a large dominant artery that gives rise to PDA and PLA. There is no plaque. Left main is a large artery that gives rise to LAD and LCX arteries. LAD is a large vessel that has no plaque.  D1-large normal LCX is a non-dominant artery.  There is no plaque. OM1-large normal Other findings: Normal pulmonary vein drainage into the left atrium. Normal left atrial appendage without a thrombus. Normal size of the pulmonary artery. Please see radiology report for non cardiac findings. IMPRESSION: 1. Coronary calcium  score of 0. This was <1 percentile for age and sex matched control. 2. Total plaque volume (TPV) 0 mm3. 3. Normal coronary origin with right dominance. 4. No evidence of CAD. CAD-RADS 0. No evidence of CAD (0%). Consider non-atherosclerotic causes of chest pain. Electronically Signed: By: Oneil Parchment M.D. On: 12/18/2022 11:24    Assessment & Plan:  Acquired hypothyroidism -     Thyroid  Panel With TSH; Future  Renal cyst, left- UA is normal. -     Urinalysis, Routine w reflex microscopic; Future  Elevated lipoprotein(a) -     Lipoprotein A (LPA); Future  Gastroesophageal reflux disease without esophagitis- Will upgrade to Voquezna . -     Vitamin B12; Future -     Vo[place]na ; Take 1 tablet by mouth daily.  Dispense: 90 tablet; Refill: 1  Encounter for general adult medical examination with abnormal findings - Exam completed, labs reviewed, vaccines reviewed, no cancer screenings indicated, pt ed material was given.   Acute nonintractable headache, unspecified headache type -     MR ANGIO HEAD WO CONTRAST     Follow-up: Return in about 6 months (around 08/03/2023).  Debby Molt, MD

## 2023-02-03 NOTE — Patient Instructions (Signed)
 Health Maintenance, Male  Adopting a healthy lifestyle and getting preventive care are important in promoting health and wellness. Ask your health care provider about:  The right schedule for you to have regular tests and exams.  Things you can do on your own to prevent diseases and keep yourself healthy.  What should I know about diet, weight, and exercise?  Eat a healthy diet    Eat a diet that includes plenty of vegetables, fruits, low-fat dairy products, and lean protein.  Do not eat a lot of foods that are high in solid fats, added sugars, or sodium.  Maintain a healthy weight  Body mass index (BMI) is a measurement that can be used to identify possible weight problems. It estimates body fat based on height and weight. Your health care provider can help determine your BMI and help you achieve or maintain a healthy weight.  Get regular exercise  Get regular exercise. This is one of the most important things you can do for your health. Most adults should:  Exercise for at least 150 minutes each week. The exercise should increase your heart rate and make you sweat (moderate-intensity exercise).  Do strengthening exercises at least twice a week. This is in addition to the moderate-intensity exercise.  Spend less time sitting. Even light physical activity can be beneficial.  Watch cholesterol and blood lipids  Have your blood tested for lipids and cholesterol at 43 years of age, then have this test every 5 years.  You may need to have your cholesterol levels checked more often if:  Your lipid or cholesterol levels are high.  You are older than 43 years of age.  You are at high risk for heart disease.  What should I know about cancer screening?  Many types of cancers can be detected early and may often be prevented. Depending on your health history and family history, you may need to have cancer screening at various ages. This may include screening for:  Colorectal cancer.  Prostate cancer.  Skin cancer.  Lung  cancer.  What should I know about heart disease, diabetes, and high blood pressure?  Blood pressure and heart disease  High blood pressure causes heart disease and increases the risk of stroke. This is more likely to develop in people who have high blood pressure readings or are overweight.  Talk with your health care provider about your target blood pressure readings.  Have your blood pressure checked:  Every 3-5 years if you are 24-52 years of age.  Every year if you are 3 years old or older.  If you are between the ages of 60 and 72 and are a current or former smoker, ask your health care provider if you should have a one-time screening for abdominal aortic aneurysm (AAA).  Diabetes  Have regular diabetes screenings. This checks your fasting blood sugar level. Have the screening done:  Once every three years after age 66 if you are at a normal weight and have a low risk for diabetes.  More often and at a younger age if you are overweight or have a high risk for diabetes.  What should I know about preventing infection?  Hepatitis B  If you have a higher risk for hepatitis B, you should be screened for this virus. Talk with your health care provider to find out if you are at risk for hepatitis B infection.  Hepatitis C  Blood testing is recommended for:  Everyone born from [birth year] through 1965.  Anyone  with known risk factors for hepatitis C.  Sexually transmitted infections (STIs)  You should be screened each year for STIs, including gonorrhea and chlamydia, if:  You are sexually active and are younger than 43 years of age.  You are older than 43 years of age and your health care provider tells you that you are at risk for this type of infection.  Your sexual activity has changed since you were last screened, and you are at increased risk for chlamydia or gonorrhea. Ask your health care provider if you are at risk.  Ask your health care provider about whether you are at high risk for HIV. Your health care provider  may recommend a prescription medicine to help prevent HIV infection. If you choose to take medicine to prevent HIV, you should first get tested for HIV. You should then be tested every 3 months for as long as you are taking the medicine.  Follow these instructions at home:  Alcohol use  Do not drink alcohol if your health care provider tells you not to drink.  If you drink alcohol:  Limit how much you have to 0-2 drinks a day.  Know how much alcohol is in your drink. In the [place] one drink equals one 12 oz bottle of beer (355 mL), one 5 oz glass of wine (148 mL), or one 1 oz glass of hard liquor (44 mL).  Lifestyle  Do not use any products that contain nicotine or tobacco. These products include cigarettes, chewing tobacco, and vaping devices, such as e-cigarettes. If you need help quitting, ask your health care provider.  Do not use street drugs.  Do not share needles.  Ask your health care provider for help if you need support or information about quitting drugs.  General instructions  Schedule regular health, dental, and eye exams.  Stay current with your vaccines.  Tell your health care provider if:  You often feel depressed.  You have ever been abused or do not feel safe at home.  Summary  Adopting a healthy lifestyle and getting preventive care are important in promoting health and wellness.  Follow your health care provider's instructions about healthy diet, exercising, and getting tested or screened for diseases.  Follow your health care provider's instructions on monitoring your cholesterol and blood pressure.  This information is not intended to replace advice given to you by your health care provider. Make sure you discuss any questions you have with your health care provider.  Document Revised: 05/29/2020 Document Reviewed: 05/29/2020  Elsevier Patient Education  2024 [employer].

## 2023-02-05 LAB — LIPOPROTEIN A (LPA): Lipoprotein (a): 300 nmol/L — ABNORMAL HIGH (ref <75)

## 2023-02-05 LAB — THYROID PANEL WITH TSH
Free Thyroxine Index: 1.7 (ref 1.4–3.8)
T3 Uptake: 28 % (ref 22–35)
T4, Total: 6.1 ug/dL (ref 4.9–10.5)
TSH: 0.14 m[IU]/L — ABNORMAL LOW (ref 0.40–4.50)

## 2023-02-06 ENCOUNTER — Other Ambulatory Visit (HOSPITAL_COMMUNITY): Payer: Self-pay

## 2023-02-07 ENCOUNTER — Other Ambulatory Visit (HOSPITAL_COMMUNITY): Payer: Self-pay

## 2023-02-17 ENCOUNTER — Other Ambulatory Visit (HOSPITAL_COMMUNITY): Payer: Self-pay

## 2023-04-07 ENCOUNTER — Ambulatory Visit: Admitting: Family Medicine

## 2023-04-07 ENCOUNTER — Other Ambulatory Visit: Payer: Self-pay

## 2023-04-07 ENCOUNTER — Encounter: Payer: Self-pay | Admitting: Family Medicine

## 2023-04-07 VITALS — BP 122/78 | HR 69 | Ht 70.0 in | Wt 178.0 lb

## 2023-04-07 DIAGNOSIS — G8929 Other chronic pain: Secondary | ICD-10-CM

## 2023-04-07 DIAGNOSIS — M25511 Pain in right shoulder: Secondary | ICD-10-CM

## 2023-04-07 NOTE — Progress Notes (Signed)
 I, Stevenson Clinch, CMA acting as a scribe for Clementeen Graham, MD.  Jacob [name] is a 43 y.o. male who presents to [facility] at [facility] today for R shoulder pain. Pt was previously seen by Dr. Jean Rosenthal on 04/11/22 for a gastroc strain.  Today, pt c/o R shoulder pain x several months. Pt locates pain to anterior aspect of the shoulder. Plays tennis regularly. Sx worse after serving. Sharp pain at anterior aspect with hitting a back hand. Sx worse with right side-lying. Pain radiating into the deltoid. Denies n/t. ROM well maintained. Has been able to play through the pain but has yelled out in pain at times d/t the pain (while playing tennis). Denies weakness in the L UE. Denies neck pain. Pt is RHD.   Radiates: deltoid Aggravates: playing tennis, side-lying Treatments tried: IBU  Dx imaging: 11/23/19 R shoulder MRI  Pertinent review of systems: No fevers or chills  Relevant historical information: Right shoulder MRI showed rotator cuff tendinitis and partial tear.  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   Exam:  BP 122/78   Pulse 69   Ht 5\' 10"  (1.778 m)   Wt 178 lb (80.7 kg)   SpO2 97%   BMI 25.54 kg/m  General: Well Developed, well nourished, and in no acute distress.   MSK: Right shoulder normal-appearing normal motion pain with abduction positive Hawkins and Neer's test.  Negative Yergason's and speeds test. Intact strength.    Lab and Radiology Results  Procedure: Real-time Ultrasound Guided Injection of right shoulder subacromial bursa Device: Philips Affiniti 50G/GE Logiq Images permanently stored and available for review in PACS Ultrasound evaluation prior to injection reveals subacromial bursitis.  No visible rotator cuff tear is present. Verbal informed consent obtained.  Discussed risks and benefits of procedure. Warned about infection, bleeding, hyperglycemia damage to structures among others. Patient expresses understanding and  agreement Time-out conducted.   Noted no overlying erythema, induration, or other signs of local infection.   Skin prepped in a sterile fashion.   Local anesthesia: Topical Ethyl chloride.   With sterile technique and under real time ultrasound guidance: 40 mg of Kenalog and 2 mL of Marcaine injected into subacromial bursa. Fluid seen entering the bursa.   Completed without difficulty   Pain immediately resolved suggesting accurate placement of the medication.   Advised to call if fevers/chills, erythema, induration, drainage, or persistent bleeding.   Images permanently stored and available for review in the ultrasound unit.  Impression: Technically successful ultrasound guided injection.         Assessment and Plan: 43 y.o. 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  There may be small partial tears not visible on ultrasound present as well.  Plan for subacromial injection and continued home exercise program.  Consider formal physical therapy if not improving.  Ultimately if not better may benefit from repeat imaging. He does know Dr. Roda Shutters therefore if surgery needed he would be a good referral.   PDMP not reviewed this encounter. Orders Placed This Encounter  Procedures   [place] LIMITED JOINT SPACE STRUCTURES UP RIGHT(NO LINKED CHARGES)    Reason for Exam (SYMPTOM  OR DIAGNOSIS REQUIRED):   right shoulder pain    Preferred imaging location?:   [facility]   No orders of the defined types were placed in this encounter.    Discussed warning signs or symptoms. Please see discharge instructions. Patient expresses understanding.   The above  documentation has been reviewed and is accurate and complete Clementeen Graham, M.D.

## 2023-04-07 NOTE — Patient Instructions (Signed)
 Thank you for coming in today.   You received an injection today. Seek immediate medical attention if the joint becomes red, extremely painful, or is oozing fluid.

## 2023-04-08 ENCOUNTER — Ambulatory Visit: Admitting: Family Medicine

## 2023-04-09 ENCOUNTER — Ambulatory Visit (INDEPENDENT_AMBULATORY_CARE_PROVIDER_SITE_OTHER)

## 2023-04-09 ENCOUNTER — Encounter: Payer: Self-pay | Admitting: Family Medicine

## 2023-04-09 DIAGNOSIS — G8929 Other chronic pain: Secondary | ICD-10-CM

## 2023-04-09 DIAGNOSIS — M25511 Pain in right shoulder: Secondary | ICD-10-CM | POA: Diagnosis not present

## 2023-04-09 NOTE — Telephone Encounter (Signed)
 Per visit note 04/07/23:  Assessment and Plan: 43 y.o. 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  There may be small partial tears not visible on ultrasound present as well.  Plan for subacromial injection and continued home exercise program.  Consider formal physical therapy if not improving.  Ultimately if not better may benefit from repeat imaging. He does know Dr. Roda Shutters therefore if surgery needed he would be a good referral.

## 2023-04-19 ENCOUNTER — Encounter: Payer: Self-pay | Admitting: Internal Medicine

## 2023-04-24 ENCOUNTER — Encounter: Payer: Self-pay | Admitting: Family Medicine

## 2023-04-24 ENCOUNTER — Other Ambulatory Visit: Payer: Self-pay | Admitting: Internal Medicine

## 2023-04-24 DIAGNOSIS — F411 Generalized anxiety disorder: Secondary | ICD-10-CM | POA: Insufficient documentation

## 2023-04-24 MED ORDER — DULOXETINE HCL 20 MG PO CPEP: 20.0000 mg | ORAL_CAPSULE | Freq: Every day | ORAL | 0 refills | Status: DC

## 2023-04-24 NOTE — Progress Notes (Signed)
 Right shoulder x-ray looks normal to radiology

## 2023-05-05 ENCOUNTER — Other Ambulatory Visit (HOSPITAL_COMMUNITY): Payer: Self-pay

## 2023-05-05 MED ORDER — BUDESONIDE-FORMOTEROL FUMARATE 80-4.5 MCG/ACT IN AERO
2.0000 | INHALATION_SPRAY | Freq: Two times a day (BID) | RESPIRATORY_TRACT | 3 refills | Status: AC
  Filled 2023-05-05: qty 30.6, 84d supply, fill #0
  Filled 2023-07-29 – 2023-12-24 (×2): qty 30.6, 84d supply, fill #1
  Filled 2024-02-03 – 2024-02-05 (×2): qty 30.6, 84d supply, fill #2

## 2023-05-06 ENCOUNTER — Other Ambulatory Visit (HOSPITAL_COMMUNITY): Payer: Self-pay

## 2023-05-07 ENCOUNTER — Encounter: Payer: Self-pay | Admitting: Family Medicine

## 2023-05-12 ENCOUNTER — Telehealth: Payer: Self-pay

## 2023-05-12 NOTE — Telephone Encounter (Signed)
 Your insurance wants you to complete 6 weeks of PT or physician directed home exercise program prior to MRI. Would you like me to refer you to PT?

## 2023-05-12 NOTE — Telephone Encounter (Signed)
 Dr. Alease Hunter, please see denial reason below and let me know if you would like to proceed with P2P.

## 2023-05-12 NOTE — Telephone Encounter (Signed)
 Good morning ladies. This patients auth denied with [employer]. I was seeing if Dr. Alease Hunter wanted to do a P2P? Patient is schedule for 05/16/2023. We will hold the appointment until 05/15/2023 10am. Just let me know if you want to do the P2P or if we need to cancel the appointment. Thank you!   Case# [id number]   ph: [phone]   denial reason:  Clinical Rationale: Your doctor is checking you for a tear in the group of muscles that holds the shoulder in place (rotator cuff tear). Your doctor ordered an MRI of your shoulder. An MRI is a way to take pictures of the inside of your body. This test should be used when your condition has not improved after six weeks of treatment by your doctor. Treatment should include medications and other forms of therapy. These need to include home exercises or physical therapy. The home exercise program should meet certain criteria. First, initial instructions should be done by a licensed provider. Second, your treatment plan should be documented. Lastly, your progress should be recorded. We reviewed the notes we have. The notes do not show that this is the case for you. We cannot approve this request as medically necessary. We used [employer] Medical Benefits Management Clinical Guideline titled Imaging of the Extremities to make this decision. You may view this guideline at [email or web address].

## 2023-05-12 NOTE — Telephone Encounter (Signed)
Radiology made aware via secure chat

## 2023-05-15 ENCOUNTER — Encounter: Payer: Self-pay | Admitting: Internal Medicine

## 2023-05-15 ENCOUNTER — Other Ambulatory Visit: Payer: Self-pay | Admitting: Internal Medicine

## 2023-05-15 DIAGNOSIS — E291 Testicular hypofunction: Secondary | ICD-10-CM | POA: Insufficient documentation

## 2023-05-16 ENCOUNTER — Other Ambulatory Visit (HOSPITAL_COMMUNITY): Payer: Self-pay

## 2023-05-16 ENCOUNTER — Other Ambulatory Visit

## 2023-05-19 ENCOUNTER — Other Ambulatory Visit (HOSPITAL_COMMUNITY): Payer: Self-pay

## 2023-05-20 ENCOUNTER — Other Ambulatory Visit (HOSPITAL_COMMUNITY): Payer: Self-pay

## 2023-05-21 ENCOUNTER — Other Ambulatory Visit (HOSPITAL_COMMUNITY): Payer: Self-pay

## 2023-05-21 MED ORDER — THYROID 120 MG PO TABS
120.0000 mg | ORAL_TABLET | Freq: Every morning | ORAL | 3 refills | Status: AC
  Filled 2023-05-21 – 2023-08-14 (×2): qty 90, 90d supply, fill #0
  Filled 2023-11-05: qty 90, 90d supply, fill #1
  Filled 2024-02-03: qty 90, 90d supply, fill #2

## 2023-05-21 MED ORDER — THYROID 30 MG PO TABS
30.0000 mg | ORAL_TABLET | Freq: Every morning | ORAL | 3 refills | Status: AC
  Filled 2023-05-21 – 2023-08-14 (×2): qty 90, 90d supply, fill #0
  Filled 2023-11-05: qty 90, 90d supply, fill #1
  Filled 2024-02-03: qty 90, 90d supply, fill #2

## 2023-05-22 ENCOUNTER — Other Ambulatory Visit: Payer: Self-pay

## 2023-05-22 ENCOUNTER — Other Ambulatory Visit: Payer: Self-pay | Admitting: Internal Medicine

## 2023-05-22 DIAGNOSIS — K219 Gastro-esophageal reflux disease without esophagitis: Secondary | ICD-10-CM

## 2023-05-22 MED ORDER — VOQUEZNA 10 MG PO TABS
1.0000 | ORAL_TABLET | Freq: Every day | ORAL | 1 refills | Status: DC
Start: 2023-05-22 — End: 2023-11-07

## 2023-05-22 MED ORDER — VOQUEZNA 10 MG PO TABS
1.0000 | ORAL_TABLET | Freq: Every day | ORAL | 0 refills | Status: DC
Start: 2023-05-22 — End: 2023-05-22

## 2023-05-28 ENCOUNTER — Ambulatory Visit: Admitting: Internal Medicine

## 2023-05-28 ENCOUNTER — Other Ambulatory Visit (INDEPENDENT_AMBULATORY_CARE_PROVIDER_SITE_OTHER)

## 2023-05-28 ENCOUNTER — Encounter: Payer: Self-pay | Admitting: Internal Medicine

## 2023-05-28 VITALS — BP 130/90 | HR 68 | Temp 98.3°F | Ht 70.0 in | Wt 174.0 lb

## 2023-05-28 DIAGNOSIS — E291 Testicular hypofunction: Secondary | ICD-10-CM

## 2023-05-28 DIAGNOSIS — J019 Acute sinusitis, unspecified: Secondary | ICD-10-CM

## 2023-05-28 LAB — CBC WITH DIFFERENTIAL/PLATELET
Basophils Absolute: 0 10*3/uL (ref 0.0–0.1)
Basophils Relative: 0.4 % (ref 0.0–3.0)
Eosinophils Absolute: 0.2 10*3/uL (ref 0.0–0.7)
Eosinophils Relative: 1.7 % (ref 0.0–5.0)
HCT: 46.3 % (ref 39.0–52.0)
Hemoglobin: 15.7 g/dL (ref 13.0–17.0)
Lymphocytes Relative: 25.8 % (ref 12.0–46.0)
Lymphs Abs: 2.8 10*3/uL (ref 0.7–4.0)
MCHC: 33.9 g/dL (ref 30.0–36.0)
MCV: 93.9 fl (ref 78.0–100.0)
Monocytes Absolute: 1.1 10*3/uL — ABNORMAL HIGH (ref 0.1–1.0)
Monocytes Relative: 10.4 % (ref 3.0–12.0)
Neutro Abs: 6.7 10*3/uL (ref 1.4–7.7)
Neutrophils Relative %: 61.7 % (ref 43.0–77.0)
Platelets: 251 10*3/uL (ref 150.0–400.0)
RBC: 4.93 Mil/uL (ref 4.22–5.81)
RDW: 13.1 % (ref 11.5–15.5)
WBC: 10.9 10*3/uL — ABNORMAL HIGH (ref 4.0–10.5)

## 2023-05-28 MED ORDER — HYDROCOD POLI-CHLORPHE POLI ER 10-8 MG/5ML PO SUER: 5.0000 mL | Freq: Two times a day (BID) | ORAL | 0 refills | Status: DC | PRN

## 2023-05-28 MED ORDER — AMOXICILLIN-POT CLAVULANATE 875-125 MG PO TABS: 1.0000 | ORAL_TABLET | Freq: Two times a day (BID) | ORAL | 0 refills | Status: AC

## 2023-05-28 NOTE — Progress Notes (Signed)
 Subjective:    Patient ID: Jacob [name], male    DOB: May 26, 1980, 43 y.o.   MRN: [id number]      HPI [facility] is here for  Chief Complaint  Patient presents with   Cough    Started feeling bad around 05/12/23 and still heavily congestion; post nasal drip; Headache off and on x 2 weeks    He is here for an acute visit for cold symptoms.  His symptoms started > 2 weeks ago - 4/21.  He has 3 kids and was around a lot of kids when his symptoms started.  He is experiencing nasal congestion, sinus pain/pressure, cough that has been productive and headaches.  He has tried taking sudafed, ibuprofen      Medications and allergies reviewed with patient and updated if appropriate.  Current Outpatient Medications on File Prior to Visit  Medication Sig Dispense Refill   budesonide -formoterol  (SYMBICORT ) 80-4.5 MCG/ACT inhaler Inhale 2 puffs into the lungs 2 (two) times daily. 30.6 g 3   clomiPHENE  (CLOMID ) 50 MG tablet Take 1 tablet (50 mg total) by mouth daily. 90 tablet 1   DULoxetine  (CYMBALTA ) 20 MG capsule Take 1 capsule (20 mg total) by mouth daily. 90 capsule 0   DULoxetine  (CYMBALTA ) 30 MG capsule TAKE 1 CAPSULE (30 MG TOTAL) BY MOUTH DAILY. MUST KEEP APPT FOR FUTURE REFILLS 90 capsule 1   levocetirizine (XYZAL ) 5 MG tablet TAKE 2 TABLETS (10 MG TOTAL) BY MOUTH AT BEDTIME. 180 tablet 1   rosuvastatin  (CRESTOR ) 10 MG tablet Take 1 tablet (10 mg total) by mouth daily. 90 tablet 4   thyroid  (NP THYROID ) 120 MG tablet Take 1 tablet (120 mg total) by mouth every morning, along with 30mg  tablet to equal 150mg  daily 90 tablet 3   thyroid  (NP THYROID ) 30 MG tablet Take 1 tablet (30 mg total) by mouth every morning, along with 120mg  tablet to equal 150mg  daily 90 tablet 3   Vonoprazan Fumarate  (VOQUEZNA ) 10 MG TABS Take 1 tablet by mouth daily. 90 tablet 1   No current facility-administered medications on file prior to visit.    Review of Systems  Constitutional:  Negative  for chills and fever.  HENT:  Positive for congestion, sinus pressure and sinus pain. Negative for ear pain, postnasal drip and sore throat.   Respiratory:  Positive for cough (productive). Negative for shortness of breath and wheezing.   Neurological:  Positive for headaches.       Objective:   Vitals:   05/28/23 1457  BP: (!) 130/90  Pulse: 68  Temp: 98.3 F (36.8 C)  SpO2: 97%   BP Readings from Last 3 Encounters:  05/28/23 (!) 130/90  04/07/23 122/78  02/03/23 128/78   Wt Readings from Last 3 Encounters:  05/28/23 174 lb (78.9 kg)  04/07/23 178 lb (80.7 kg)  02/03/23 179 lb 3.2 oz (81.3 kg)   Body mass index is 24.97 kg/m.    Physical Exam Constitutional:      General: He is not in acute distress.    Appearance: Normal appearance. He is not ill-appearing.  HENT:     Head: Normocephalic.     Right Ear: Tympanic membrane, ear canal and external ear normal. There is no impacted cerumen.     Left Ear: Tympanic membrane, ear canal and external ear normal. There is no impacted cerumen.     Mouth/Throat:     Mouth: Mucous membranes are moist.     Pharynx: No oropharyngeal exudate  or posterior oropharyngeal erythema.  Eyes:     Conjunctiva/sclera: Conjunctivae normal.  Cardiovascular:     Rate and Rhythm: Normal rate and regular rhythm.  Pulmonary:     Effort: Pulmonary effort is normal. No respiratory distress.     Breath sounds: Normal breath sounds. No wheezing or rales.  Musculoskeletal:     Cervical back: Neck supple. No tenderness.  Lymphadenopathy:     Cervical: No cervical adenopathy.  Skin:    General: Skin is warm and dry.     Findings: No rash.  Neurological:     Mental Status: He is alert.            Assessment & Plan:    Acute sinus infection: Acute Likely bacterial  Start Augmentin  875-125 mg BID x 10 day Tussionex cough syrup otc cold medications Rest, fluid Call if no improvement

## 2023-05-28 NOTE — Patient Instructions (Addendum)
    Medications changes include :   Augmentin  twice daily x 7 days, tussinex cough syrup    Return if symptoms worsen or fail to improve.

## 2023-06-03 NOTE — Progress Notes (Unsigned)
   Joanna Muck, PhD, LAT, ATC acting as a scribe for Garlan Juniper, MD.  Jacob [name] is a 43 y.o. male who presents to [facility] at [facility] today for L forearm pain. Pt was previously seen by Dr. Alease Hunter on 04/07/23 for R shoulder pain.  Today, pt c/o L forearm pain x ***. Pt locates pain to **  Radiates: Paresthesia: Grip strength: Aggravates: Treatments tried:  Pertinent review of systems: ***  Relevant historical information: ***   Exam:  There were no vitals taken for this visit. General: Well Developed, well nourished, and in no acute distress.   MSK: ***    Lab and Radiology Results No results found for this or any previous visit (from the past 72 hours). No results found.     Assessment and Plan: 43 y.o. male with ***   PDMP not reviewed this encounter. No orders of the defined types were placed in this encounter.  No orders of the defined types were placed in this encounter.    Discussed warning signs or symptoms. Please see discharge instructions. Patient expresses understanding.   ***

## 2023-06-04 ENCOUNTER — Encounter: Payer: Self-pay | Admitting: Family Medicine

## 2023-06-04 ENCOUNTER — Other Ambulatory Visit: Payer: Self-pay

## 2023-06-04 ENCOUNTER — Ambulatory Visit: Admitting: Family Medicine

## 2023-06-04 VITALS — BP 102/78 | HR 67 | Ht 70.0 in

## 2023-06-04 DIAGNOSIS — G5632 Lesion of radial nerve, left upper limb: Secondary | ICD-10-CM | POA: Diagnosis not present

## 2023-06-04 DIAGNOSIS — G5630 Lesion of radial nerve, unspecified upper limb: Secondary | ICD-10-CM

## 2023-06-04 NOTE — Patient Instructions (Addendum)
 Thank you for coming in today.   You received an injection today. Seek immediate medical attention if the joint becomes red, extremely painful, or is oozing fluid.   Let me know if you would like to try occupational therapy.

## 2023-06-12 ENCOUNTER — Other Ambulatory Visit: Payer: Self-pay | Admitting: Internal Medicine

## 2023-06-12 DIAGNOSIS — F418 Other specified anxiety disorders: Secondary | ICD-10-CM

## 2023-06-19 ENCOUNTER — Encounter: Payer: Self-pay | Admitting: Internal Medicine

## 2023-06-19 ENCOUNTER — Ambulatory Visit: Admitting: Internal Medicine

## 2023-06-19 VITALS — BP 102/78 | HR 85 | Temp 98.3°F | Ht 70.0 in | Wt 176.0 lb

## 2023-06-19 DIAGNOSIS — L309 Dermatitis, unspecified: Secondary | ICD-10-CM

## 2023-06-19 DIAGNOSIS — L719 Rosacea, unspecified: Secondary | ICD-10-CM | POA: Insufficient documentation

## 2023-06-19 DIAGNOSIS — L299 Pruritus, unspecified: Secondary | ICD-10-CM | POA: Diagnosis not present

## 2023-06-19 DIAGNOSIS — Z79899 Other long term (current) drug therapy: Secondary | ICD-10-CM | POA: Insufficient documentation

## 2023-06-19 DIAGNOSIS — L700 Acne vulgaris: Secondary | ICD-10-CM | POA: Insufficient documentation

## 2023-06-19 DIAGNOSIS — Z872 Personal history of diseases of the skin and subcutaneous tissue: Secondary | ICD-10-CM | POA: Insufficient documentation

## 2023-06-19 DIAGNOSIS — L819 Disorder of pigmentation, unspecified: Secondary | ICD-10-CM | POA: Insufficient documentation

## 2023-06-19 LAB — URINALYSIS
Bilirubin Urine: NEGATIVE
Hgb urine dipstick: NEGATIVE
Ketones, ur: NEGATIVE
Leukocytes,Ua: NEGATIVE
Nitrite: NEGATIVE
Specific Gravity, Urine: 1.015 (ref 1.000–1.030)
Total Protein, Urine: NEGATIVE
Urine Glucose: NEGATIVE
Urobilinogen, UA: 0.2 (ref 0.0–1.0)
pH: 6 (ref 5.0–8.0)

## 2023-06-19 LAB — COMPREHENSIVE METABOLIC PANEL WITH GFR
ALT: 25 U/L (ref 0–53)
AST: 22 U/L (ref 0–37)
Albumin: 4.4 g/dL (ref 3.5–5.2)
Alkaline Phosphatase: 54 U/L (ref 39–117)
BUN: 22 mg/dL (ref 6–23)
CO2: 28 meq/L (ref 19–32)
Calcium: 9 mg/dL (ref 8.4–10.5)
Chloride: 104 meq/L (ref 96–112)
Creatinine, Ser: 1.09 mg/dL (ref 0.40–1.50)
GFR: 83.38 mL/min (ref >60.00)
Glucose, Bld: 66 mg/dL — ABNORMAL LOW (ref 70–99)
Potassium: 4.1 meq/L (ref 3.5–5.1)
Sodium: 138 meq/L (ref 135–145)
Total Bilirubin: 0.4 mg/dL (ref 0.2–1.2)
Total Protein: 6.9 g/dL (ref 6.0–8.3)

## 2023-06-19 LAB — TSH: TSH: 1.48 u[IU]/mL (ref 0.35–5.50)

## 2023-06-19 LAB — CBC WITH DIFFERENTIAL/PLATELET
Basophils Absolute: 0.1 10*3/uL (ref 0.0–0.1)
Basophils Relative: 1.1 % (ref 0.0–3.0)
Eosinophils Absolute: 0.1 10*3/uL (ref 0.0–0.7)
Eosinophils Relative: 1.4 % (ref 0.0–5.0)
HCT: 43.6 % (ref 39.0–52.0)
Hemoglobin: 15.1 g/dL (ref 13.0–17.0)
Lymphocytes Relative: 32.7 % (ref 12.0–46.0)
Lymphs Abs: 3.1 10*3/uL (ref 0.7–4.0)
MCHC: 34.6 g/dL (ref 30.0–36.0)
MCV: 91.3 fl (ref 78.0–100.0)
Monocytes Absolute: 1 10*3/uL (ref 0.1–1.0)
Monocytes Relative: 10.2 % (ref 3.0–12.0)
Neutro Abs: 5.2 10*3/uL (ref 1.4–7.7)
Neutrophils Relative %: 54.6 % (ref 43.0–77.0)
Platelets: 259 10*3/uL (ref 150.0–400.0)
RBC: 4.77 Mil/uL (ref 4.22–5.81)
RDW: 13.2 % (ref 11.5–15.5)
WBC: 9.6 10*3/uL (ref 4.0–10.5)

## 2023-06-19 MED ORDER — METHYLPREDNISOLONE 4 MG PO TBPK: ORAL_TABLET | ORAL | 0 refills | Status: AC

## 2023-06-19 MED ORDER — HYDROXYZINE PAMOATE 25 MG PO CAPS: 25.0000 mg | ORAL_CAPSULE | Freq: Four times a day (QID) | ORAL | 0 refills | Status: DC | PRN

## 2023-06-19 NOTE — Assessment & Plan Note (Signed)
 Stop all meds/vitamins

## 2023-06-19 NOTE — Progress Notes (Unsigned)
 Subjective:  Patient ID: Jacob [name], male    DOB: 04-07-1980  Age: 43 y.o. MRN: [id number]  CC: Pruritis   HPI [name] presents for itching all over x 2 weeks.  It started suddenly.  The itching was all over and pretty extreme.  There was no hives or other rashes present.  He continues to have some residual itching.  He is on Xyzal  and as needed Benadryl. He has a very extensive history of environmental allergens.  There is a fairly remote history of hives. He denies any severe food allergies.  No previous drug allergies. He has been on Crestor  for about a year and on Voquezna  for about 6 months. He is taking a lot of vitamins and supplements.  Outpatient Medications Prior to Visit  Medication Sig Dispense Refill   budesonide -formoterol  (SYMBICORT ) 80-4.5 MCG/ACT inhaler Inhale 2 puffs into the lungs 2 (two) times daily. 30.6 g 3   chlorpheniramine-HYDROcodone  (TUSSIONEX) 10-8 MG/5ML Take 5 mLs by mouth every 12 (twelve) hours as needed. 115 mL 0   clomiPHENE  (CLOMID ) 50 MG tablet Take 1 tablet (50 mg total) by mouth daily. 90 tablet 1   DULoxetine  (CYMBALTA ) 30 MG capsule TAKE 1 CAPSULE (30 MG TOTAL) BY MOUTH DAILY. MUST KEEP APPT FOR FUTURE REFILLS 90 capsule 1   levocetirizine (XYZAL ) 5 MG tablet TAKE 2 TABLETS (10 MG TOTAL) BY MOUTH AT BEDTIME. 180 tablet 1   rosuvastatin  (CRESTOR ) 10 MG tablet Take 1 tablet (10 mg total) by mouth daily. 90 tablet 4   thyroid  (NP THYROID ) 120 MG tablet Take 1 tablet (120 mg total) by mouth every morning, along with 30mg  tablet to equal 150mg  daily 90 tablet 3   thyroid  (NP THYROID ) 30 MG tablet Take 1 tablet (30 mg total) by mouth every morning, along with 120mg  tablet to equal 150mg  daily 90 tablet 3   Vonoprazan Fumarate  (VOQUEZNA ) 10 MG TABS Take 1 tablet by mouth daily. 90 tablet 1   DULoxetine  (CYMBALTA ) 20 MG capsule Take 1 capsule (20 mg total) by mouth daily. 90 capsule 0   No facility-administered medications prior to  visit.    ROS: Review of Systems  Constitutional:  Negative for appetite change, fatigue and unexpected weight change.  HENT:  Negative for congestion, nosebleeds, sneezing, sore throat and trouble swallowing.   Eyes:  Negative for itching and visual disturbance.  Respiratory:  Negative for cough.   Cardiovascular:  Negative for chest pain, palpitations and leg swelling.  Gastrointestinal:  Negative for abdominal distention, blood in stool, diarrhea and nausea.  Genitourinary:  Negative for frequency and hematuria.  Musculoskeletal:  Negative for back pain, gait problem, joint swelling and neck pain.  Skin:  Negative for color change and rash.  Neurological:  Negative for dizziness, tremors, speech difficulty and weakness.  Psychiatric/Behavioral:  Negative for agitation, dysphoric mood, sleep disturbance and suicidal ideas. The patient is nervous/anxious.     Objective:  BP 102/78   Pulse 85   Temp 98.3 F (36.8 C) (Oral)   Ht 5\' 10"  (1.778 m)   Wt 176 lb (79.8 kg)   SpO2 98%   BMI 25.25 kg/m   BP Readings from Last 3 Encounters:  06/19/23 102/78  06/04/23 102/78  05/28/23 (!) 130/90    Wt Readings from Last 3 Encounters:  06/19/23 176 lb (79.8 kg)  05/28/23 174 lb (78.9 kg)  04/07/23 178 lb (80.7 kg)    Physical Exam Constitutional:      General: He  is not in acute distress.    Appearance: He is well-developed.     Comments: NAD  Eyes:     Conjunctiva/sclera: Conjunctivae normal.     Pupils: Pupils are equal, round, and reactive to light.  Neck:     Thyroid : No thyromegaly.     Vascular: No JVD.  Cardiovascular:     Rate and Rhythm: Normal rate and regular rhythm.     Heart sounds: Normal heart sounds. No murmur heard.    No friction rub. No gallop.  Pulmonary:     Effort: Pulmonary effort is normal. No respiratory distress.     Breath sounds: Normal breath sounds. No wheezing or rales.  Chest:     Chest wall: No tenderness.  Abdominal:     General:  Bowel sounds are normal. There is no distension.     Palpations: Abdomen is soft. There is no mass.     Tenderness: There is no abdominal tenderness. There is no guarding or rebound.  Musculoskeletal:        General: No tenderness. Normal range of motion.     Cervical back: Normal range of motion.  Lymphadenopathy:     Cervical: No cervical adenopathy.  Skin:    General: Skin is warm and dry.     Findings: No rash.  Neurological:     Mental Status: He is alert and oriented to person, place, and time.     Cranial Nerves: No cranial nerve deficit.     Motor: No abnormal muscle tone.     Coordination: Coordination normal.     Gait: Gait normal.     Deep Tendon Reflexes: Reflexes are normal and symmetric.  Psychiatric:        Behavior: Behavior normal.        Thought Content: Thought content normal.        Judgment: Judgment normal.   No jaundice.  No rash  Lab Results  Component Value Date   WBC 9.6 06/19/2023   HGB 15.1 06/19/2023   HCT 43.6 06/19/2023   PLT 259.0 06/19/2023   GLUCOSE 66 (L) 06/19/2023   CHOL 166 12/06/2022   TRIG 73 12/06/2022   HDL 68 12/06/2022   LDLCALC 84 12/06/2022   ALT 25 06/19/2023   AST 22 06/19/2023   NA 138 06/19/2023   K 4.1 06/19/2023   CL 104 06/19/2023   CREATININE 1.09 06/19/2023   BUN 22 06/19/2023   CO2 28 06/19/2023   TSH 1.48 06/19/2023   PSA 0.53 01/10/2021   HGBA1C 5.4 12/06/2022    CT CORONARY MORPH W/CTA COR W/SCORE W/CA W/CM &/OR WO/CM Addendum Date: 12/31/2022 ADDENDUM REPORT: 12/31/2022 23:56 EXAM: OVER-READ INTERPRETATION  CT CHEST The following report is an over-read performed by radiologist Dr. Virgle Grime of [facility] on 12/31/2022. This over-read does not include interpretation of cardiac or coronary anatomy or pathology. The coronary calcium  score/coronary CTA interpretation by the cardiologist is attached. COMPARISON:  March 18, 2019 FINDINGS: Cardiovascular: There are no significant  extracardiac vascular findings. Mediastinum/Nodes: There are no enlarged lymph nodes within the visualized mediastinum. Lungs/Pleura: There is no pleural effusion. The visualized lungs appear clear. Upper abdomen: No significant findings in the visualized upper abdomen. Musculoskeletal/Chest wall: No chest wall mass or suspicious osseous findings within the visualized chest. IMPRESSION: No significant extracardiac findings within the visualized chest. Electronically Signed   By: Virgle Grime M.D.   On: 12/31/2022 23:56   Result Date: 12/31/2022 CLINICAL DATA:  43 year old  with chest pain EXAM: Cardiac/Coronary  CTA TECHNIQUE: The patient was scanned on a [employer]. FINDINGS: A 100 kV prospective scan was triggered in the descending thoracic aorta at 111 HU's. Axial non-contrast 3 mm slices were carried out through the heart. The data set was analyzed on a dedicated work station and scored using the Agatson method. Gantry rotation speed was 250 msecs and collimation was .6 mm. 0.8 mg of sl NTG was given. The 3D data set was reconstructed in 5% intervals of the 67-82 % of the R-R cycle. Diastolic phases were analyzed on a dedicated work station using MPR, MIP and VRT modes. The patient received 80 cc of contrast. Image quality: good Aorta:  Normal size (28 mm).  No calcifications.  No dissection. Aortic Valve:  Trileaflet.  No calcifications. Coronary Arteries:  Normal coronary origin.  Right dominance. RCA is a large dominant artery that gives rise to PDA and PLA. There is no plaque. Left main is a large artery that gives rise to LAD and LCX arteries. LAD is a large vessel that has no plaque. D1-large normal LCX is a non-dominant artery.  There is no plaque. OM1-large normal Other findings: Normal pulmonary vein drainage into the left atrium. Normal left atrial appendage without a thrombus. Normal size of the pulmonary artery. Please see radiology report for non cardiac findings. IMPRESSION: 1.  Coronary calcium  score of 0. This was <1 percentile for age and sex matched control. 2. Total plaque volume (TPV) 0 mm3. 3. Normal coronary origin with right dominance. 4. No evidence of CAD. CAD-RADS 0. No evidence of CAD (0%). Consider non-atherosclerotic causes of chest pain. Electronically Signed: By: Dorothye Gathers M.D. On: 12/18/2022 11:24    Assessment & Plan:   Problem List Items Addressed This Visit     Dermatitis   Remote.  No rashes at present.  On Xyzal       Pruritus - Primary    New itching all over x 2 weeks.  It started suddenly.  The itching was all over and pretty extreme.  There was no hives or other rashes present.  He continues to have some residual itching.  He is on Xyzal  and as needed Benadryl. He has a very extensive history of environmental allergens.  There is a fairly remote history of hives. He denies any severe food allergies.  No previous drug allergies. He has been on Crestor  for about a year and on Voquezna  for about 6 months. He is taking a lot of vitamins and supplements.  Generalized pruritus.  Unclear etiology.  Stop all meds/vitamins for a week or two.  When itching are resolved restart meds one by one and watch for pruritus/allergy symptoms to come back. If severe pruritus relapsed, use Medrol  DosePack and hydroxyzine. Obtain lab work including liver function tests, CBC and others.  He had a chest CT within the last 6 months. Continue with daily Xyzal .  Follow-up with Dr. Rochelle Chu in 3 to 4 months if well and much sooner if pruritus did not go away or relapsed.      Relevant Orders   TSH (Completed)   Urinalysis (Completed)   CBC with Differential/Platelet (Completed)   Comprehensive metabolic panel with GFR (Completed)      Meds ordered this encounter  Medications   methylPREDNISolone  (MEDROL  DOSEPAK) 4 MG TBPK tablet    Sig: As directed    Dispense:  21 tablet    Refill:  0   hydrOXYzine (VISTARIL) 25 MG capsule  Sig: Take 1 capsule (25 mg  total) by mouth every 6 (six) hours as needed for itching.    Dispense:  60 capsule    Refill:  0      Follow-up: Return in about 4 months (around 10/20/2023) for f/u with PCP.  Anitra Barn, MD

## 2023-06-20 ENCOUNTER — Encounter: Payer: Self-pay | Admitting: Internal Medicine

## 2023-06-20 ENCOUNTER — Ambulatory Visit: Payer: Self-pay | Admitting: Internal Medicine

## 2023-06-20 NOTE — Assessment & Plan Note (Signed)
 Remote.  No rashes at present.  On Xyzal 

## 2023-06-25 ENCOUNTER — Ambulatory Visit: Admitting: Internal Medicine

## 2023-06-26 ENCOUNTER — Encounter: Payer: Self-pay | Admitting: Family Medicine

## 2023-06-26 ENCOUNTER — Other Ambulatory Visit: Payer: Self-pay | Admitting: Internal Medicine

## 2023-06-26 DIAGNOSIS — G5632 Lesion of radial nerve, left upper limb: Secondary | ICD-10-CM

## 2023-06-30 ENCOUNTER — Encounter: Payer: Self-pay | Admitting: Rehabilitative and Restorative Service Providers"

## 2023-06-30 ENCOUNTER — Ambulatory Visit (INDEPENDENT_AMBULATORY_CARE_PROVIDER_SITE_OTHER): Admitting: Rehabilitative and Restorative Service Providers"

## 2023-06-30 DIAGNOSIS — M25612 Stiffness of left shoulder, not elsewhere classified: Secondary | ICD-10-CM | POA: Diagnosis not present

## 2023-06-30 DIAGNOSIS — M79632 Pain in left forearm: Secondary | ICD-10-CM

## 2023-06-30 DIAGNOSIS — M6281 Muscle weakness (generalized): Secondary | ICD-10-CM | POA: Diagnosis not present

## 2023-06-30 NOTE — Therapy (Signed)
 OUTPATIENT OCCUPATIONAL THERAPY ORTHO EVALUATION  Patient Name: Jacob [name] MRN: [id number] DOB:February 03, 1980, 43 y.o., male Today's Date: 06/30/2023  PCP: Dierdre Foyer MD REFERRING PROVIDER: Syliva Even, MD   END OF SESSION:  OT End of Session - 06/30/23 1353     Visit Number 1    Number of Visits 7    Date for OT Re-Evaluation 08/15/23    Authorization Type [employer]    OT Start Time 1304    OT Stop Time 1353    OT Time Calculation (min) 49 min    Activity Tolerance Patient tolerated treatment well;Patient limited by pain;No increased pain    Behavior During Therapy WFL for tasks assessed/performed             Past Medical History:  Diagnosis Date   Anxiety    Community acquired MRSA infection    recurrent, 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 - follows with [facility], neg cx 07/2013   Depression    GERD (gastroesophageal reflux disease)    w/ HH   H/O alcohol abuse    sober since 02/04/2007   Hypothyroid    Past Surgical History:  Procedure Laterality Date   WRIST SURGERY Left    Patient Active Problem List   Diagnosis Date Noted   Acne vulgaris 06/19/2023   Dermatitis 06/19/2023   Encounter for long-term current use of medication 06/19/2023   History of skin disorder 06/19/2023   Hyperpigmentation 06/19/2023   Rosacea 06/19/2023   Pruritus 06/19/2023   Radial tunnel syndrome, left 06/04/2023   Hypogonadism male 05/15/2023   GAD (generalized anxiety disorder) 04/24/2023   Melanocytic nevi of trunk 02/03/2023   Encounter for general adult medical examination with abnormal findings 02/03/2023   Gastroesophageal reflux disease without esophagitis 01/10/2022   Motion sickness 11/15/2021   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   Rotator cuff tendinitis, right 01/18/2021   Tinea pedis of both feet 12/03/2019   Renal cyst, left 03/24/2016   Routine general medical examination at a health care facility 11/22/2015    ONSET DATE: Approximately 1 month onset of pain  REFERRING DIAG:  G56.32 (ICD-10-CM) - Radial tunnel syndrome, left   THERAPY DIAG:  Muscle weakness (generalized)  Pain in left forearm  Stiffness of left shoulder, not elsewhere classified  Rationale for Evaluation and Treatment: Rehabilitation  SUBJECTIVE:   SUBJECTIVE STATEMENT: He states that for about a month he has had worsening pain in the left forearm near the lateral epicondyle and through the dorsum of the forearm.  He also intermittently and occasionally has pain in the medial epicondyle.  These things are worst when lifting weights and curling with the palm up.  Rotational motions of the forearm also hurt him with activation of the supinator muscle.  He describes his pain as sometimes a shooting pain but also as a soreness in the muscle bellies as well.  Recent injection helped, but pain has come back.  He plays tennis and is physically active and also has to lift and carry his child at times.    PERTINENT HISTORY: He does state past history of Lt wrist surgery resulting from problems arising from repetitive tennis playing.  PRECAUTIONS: None  RED FLAGS: None   WEIGHT BEARING RESTRICTIONS: No  PAIN:  Are you having pain? Yes: NPRS scale: 0-1/10 at rest but up to 6-7/10 at worst in the past week when curling with the palm up position. Pain location: Mainly left lateral elbow area and forearm Pain description: Soreness and sometimes sharp and  shooting Aggravating factors: Supination or wrist extension Relieving factors: Rest  FALLS: Has patient fallen in last 6 months? No  LIVING ENVIRONMENT: Lives with: lives with their family Lives in: House/apartment Has following equipment at home: None  PLOF: Independent  PATIENT GOALS: To improve pain and symptoms in the left nondominant arm and to prevent these things from starting in the right arm as well.  NEXT MD VISIT: As needed  OBJECTIVE: (All objective assessments below are from initial evaluation on: 06/30/2023 unless otherwise  specified.)   HAND DOMINANCE: Right   ADLs: Overall ADLs: States decreased ability to lift objects in a supinated and elbow flexing way, holding home objects with palm, driving can be painful, difficulty with workout routines and sometimes holding his child.   FUNCTIONAL OUTCOME MEASURES: Eval: Patient Specific Functional Scale: 4 (lifting weights, holding home objects with palm up, driving)  (Higher Score  =  Better Ability for the Selected Tasks)      UPPER EXTREMITY ROM     Shoulder to Wrist AROM Right eval Left eval  Shoulder flexion    Shoulder abduction    Shoulder extension    Shoulder internal rotation  49  Shoulder external rotation  95  Elbow flexion    Elbow extension    Forearm supination 92 80 radial pain  Forearm pronation  80 78  Wrist flexion 82 77  Wrist extension 60 70  Wrist ulnar deviation    Wrist radial deviation    Functional dart thrower's motion (F-DTM) in ulnar flexion    F-DTM in radial extension     (Blank rows = not tested)   UPPER EXTREMITY MMT:     MMT Right 06/30/23 Left 06/30/23  Shoulder flexion    Shoulder abduction    Shoulder adduction    Shoulder extension    Shoulder internal rotation 5/5 5/5  Shoulder external rotation 4-/5 4/5  Middle trapezius    Lower trapezius    Elbow flexion  5/5  Elbow extension  5/5  Forearm supination  4/5 tender  Forearm pronation  5/5  Wrist flexion  4+/5  Wrist extension  4/5  Wrist ulnar deviation    Wrist radial deviation    (Blank rows = not tested)  HAND FUNCTION: Eval: No significant observed weakness for hand strength or gripping, though may be mildly tender to the medial epicondyle and pulling through the hand and wrist extensors.  Details TBD as helpful  COORDINATION: Eval: No significant observed coordination deficits other than some pain in the end range of motion or repetitive motion due to apparent tendinitis and nerve irritation.  SENSATION: Eval:  Light touch intact today,  and no real complaints of paresthesia, though does have some pain symptoms similar to nerve pain through the radial tunnel area.  EDEMA:   Eval: None  COGNITION: Eval: Overall cognitive status: WFL for evaluation today   OBSERVATIONS:   Eval:  positive radial nerve tension test, positive pain with resisted wrist ext, no pain with biceps/triceps activation, TTP to Lt tricep with muscle spasm, apparently tight shoulder internal rotation with "cramping" and tender external rotators.  Presents as "chain tightness" from the shoulder external rotators, down the tricep and into the lateral epicondyle.  This presents as an interesting blend of radial tunnel and lateral epicondylitis type syndromes.    TODAY'S TREATMENT:  Post-evaluation treatment:   For safety/self-care, he was given recommendations to avoid repetitive motion at the wrist and elbow, avoid lifting with supinated positions, avoid lifting heavy  weights that cause pain.  This is a soft tissue injury and somewhat guarded, so it just needs time to calm down and heal.  He can use ice as helpful as well as anti-inflammatories as safe.  He was recommended to perform self massages and trigger point releases to tightness through the forearm and tricep as helpful.  He was recommended to not sleep with the externally rotated shoulder as this increases tightness through this mechanism.  He was also recommended to use compressive arm sleeve when working out or doing anything that normally hurts him.  He was also given the following home exercise program to perform 3-4 times a day consistently, without pain, to help relieve this change of tightness running through this fascial band.  He tolerates all of these well, states feeling good stretches, has no significant pain or questions at the end.    Exercises - Sleeper Stretch  - 3-4 x daily - 3-5 reps - 15 hold - Tricep Stretch- DO SEATED BY TABLE  - 3-4 x daily - 3-5 reps - 15 hold - Forearm  Pronation Stretch  - 3-4 x daily - 3-5 reps - 15 sec hold - Seated Wrist Flexion Stretch  - 3-4 x daily - 3 reps - 15 second  hold - Standing Radial Nerve Glide  - 4-6 x daily - 1 sets - 10-15 reps  PATIENT EDUCATION: Education details: See tx section above for details  Person educated: Patient Education method: Verbal Instruction, Teach back, Handouts  Education comprehension: States and demonstrates understanding, Additional Education required    HOME EXERCISE PROGRAM: Access Code: [email or web address] URL: [email or web address] Date: 06/30/2023 Prepared by: Leartis Proud   GOALS: Goals reviewed with patient? Yes   SHORT TERM GOALS: (STG required if POC>30 days) Target Date: 07/11/2023  Pt will demo/state understanding of initial HEP to improve pain levels and prerequisite motion. Goal status: INITIAL   LONG TERM GOALS: Target Date: 08/15/2023  Pt will improve functional ability by decreased impairment per PSFS assessment from 4 to 7 or better, for better quality of life. Goal status: INITIAL  2.  Pt will improve A/ROM in left forearm supination from painful 80 degrees to at least nonpainful 85 degrees, to have functional motion for tasks like reach and grasp.  Goal status: INITIAL  3.  Pt will improve strength in left forearm supination and wrist extension from tender 4/5 MMT to at least nonpainful 5/5 MMT to have increased functional ability to carry out selfcare and higher-level homecare tasks with less difficulty. Goal status: INITIAL  4.  Pt will decrease pain at worst from 6-7/10 to 2/10 or better to have better sleep and occupational participation in daily roles. Goal status: INITIAL   ASSESSMENT:  CLINICAL IMPRESSION: Patient is a 43 y.o. male who was seen today for occupational therapy evaluation for pain in the left dorsal forearm (and some mild intermittent complaints of medial forearm pain at times, which is likely pain from compensation/overuse), that  presents as an interesting blend of radial tunnel syndrome as well as mild lateral epicondylitis.  It is also exacerbated by stiffness that runs through his shoulder and tricep.  The patient will benefit from outpatient occupational therapy to decrease symptoms, improve functional upper extremity use, and increase quality of life.  PERFORMANCE DEFICITS: in functional skills including IADLs, ROM, strength, pain, fascial restrictions, muscle spasms, flexibility, body mechanics, endurance, decreased knowledge of precautions, and UE functional use, cognitive skills including problem solving and safety awareness, and psychosocial skills including coping  strategies, environmental adaptation, habits, and routines and behaviors.   IMPAIRMENTS: are limiting patient from ADLs, IADLs, rest and sleep, and leisure.   COMORBIDITIES: has no other co-morbidities that affects occupational performance. Patient will benefit from skilled OT to address above impairments and improve overall function.  MODIFICATION OR ASSISTANCE TO COMPLETE EVALUATION: Min-Moderate modification of tasks or assist with assess necessary to complete an evaluation.  OT OCCUPATIONAL PROFILE AND HISTORY: Problem focused assessment: Including review of records relating to presenting problem.  CLINICAL DECISION MAKING: Moderate - several treatment options, min-mod task modification necessary  REHAB POTENTIAL: Excellent  EVALUATION COMPLEXITY: Low      PLAN:  OT FREQUENCY: 1-2x/week  OT DURATION: 6 weeks through 08/15/23 and up to 7 total visits as needed   PLANNED INTERVENTIONS: 97535 self care/ADL training, 81191 therapeutic exercise, 97530 therapeutic activity, 97112 neuromuscular re-education, 97140 manual therapy, 97035 ultrasound, 97032 electrical stimulation (manual), 97760 Orthotic Initial, 97763 Orthotic/Prosthetic subsequent, compression bandaging, Dry needling, energy conservation, coping strategies training, and  patient/family education  RECOMMENDED OTHER SERVICES: none now    CONSULTED AND AGREED WITH PLAN OF CARE: Patient  PLAN FOR NEXT SESSION:   Review initial HEP and recommendations, he was very interested in trying manual therapy dry needling, and if he still having painful muscle spasms this will be performed in the next session.   Leartis Proud, OTR/L, CHT  06/30/2023, 4:55 PM

## 2023-07-07 NOTE — Therapy (Addendum)
 " OUTPATIENT OCCUPATIONAL THERAPY TREATMENT & DISCHARGE NOTE  Patient Name: Jacob [name] MRN: [id number] DOB:Nov 11, 1980, 43 y.o., male Today's Date: 07/09/2023  PCP: Marcela LANES MD REFERRING PROVIDER: Joane Artist RAMAN, MD                    OCCUPATIONAL THERAPY DISCHARGE SUMMARY  Visits from Start of Care: 2  Pt did not show up to additional OT visits and is officially D/C at this point.  Please see notes for any details about status, but unfortunately LTG's could not be finalized.   Melvenia Ada, OTR/L, CHT 02/12/24                  END OF SESSION:  OT End of Session - 07/09/23 1350     Visit Number 2    Number of Visits 7    Date for OT Re-Evaluation 08/15/23    Authorization Type [employer]    OT Start Time 1350    OT Stop Time 1435    OT Time Calculation (min) 45 min    Activity Tolerance Patient tolerated treatment well;No increased pain;Patient limited by fatigue    Behavior During Therapy [facility] for tasks assessed/performed           Past Medical History:  Diagnosis Date   Anxiety    Community acquired MRSA infection    recurrent, 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 - follows with [facility], neg cx 07/2013   Depression    GERD (gastroesophageal reflux disease)    w/ HH   H/O alcohol abuse    sober since 02/04/2007   Hypothyroid    Past Surgical History:  Procedure Laterality Date   WRIST SURGERY Left    Patient Active Problem List   Diagnosis Date Noted   Acne vulgaris 06/19/2023   Dermatitis 06/19/2023   Encounter for long-term current use of medication 06/19/2023   History of skin disorder 06/19/2023   Hyperpigmentation 06/19/2023   Rosacea 06/19/2023   Pruritus 06/19/2023   Radial tunnel syndrome, left 06/04/2023   Hypogonadism male 05/15/2023   GAD (generalized anxiety disorder) 04/24/2023   Melanocytic nevi of trunk 02/03/2023   Encounter for general adult medical examination with abnormal findings 02/03/2023   Gastroesophageal reflux  disease without esophagitis 01/10/2022   Motion sickness 11/15/2021   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   Rotator cuff tendinitis, right 01/18/2021   Tinea pedis of both feet 12/03/2019   Renal cyst, left 03/24/2016   Routine general medical examination at a health care facility 11/22/2015    ONSET DATE: Approximately 1 month onset of pain  REFERRING DIAG: G56.32 (ICD-10-CM) - Radial tunnel syndrome, left   THERAPY DIAG:  Muscle weakness (generalized)  Pain in left forearm  Stiffness of left shoulder, not elsewhere classified  Rationale for Evaluation and Treatment: Rehabilitation  PERTINENT HISTORY: He does state past history of Lt wrist surgery resulting from problems arising from repetitive tennis playing. He states that for about a month he has had worsening pain in the left forearm near the lateral epicondyle and through the dorsum of the forearm.  He also intermittently and occasionally has pain in the medial epicondyle.  These things are worst when lifting weights and curling with the palm up.  Rotational motions of the forearm also hurt him with activation of the supinator muscle.  He describes his pain as sometimes a shooting pain but also as a soreness in the muscle bellies as well.  Recent injection helped, but pain has come back.  He plays tennis and is physically active and also has to lift and carry his child at times.     PRECAUTIONS: None  RED FLAGS: None   WEIGHT BEARING RESTRICTIONS: No   SUBJECTIVE:   SUBJECTIVE STATEMENT: He states I feel like I am getting a little better.  He has no significant resting pain today and he has been limiting his pain at worst in the past week.    PAIN:  Are you having pain? Yes: NPRS scale: 0/10 at rest but up to 4-5/10 at worst in the past week when curling with the palm up position. Pain location: Mainly left lateral elbow area and forearm Pain description: Soreness and sometimes sharp and shooting Aggravating  factors: Supination or wrist extension Relieving factors: Rest    PATIENT GOALS: To improve pain and symptoms in the left nondominant arm and to prevent these things from starting in the right arm as well.  NEXT MD VISIT: As needed  OBJECTIVE: (All objective assessments below are from initial evaluation on: 06/30/2023 unless otherwise specified.)   HAND DOMINANCE: Right   ADLs: Overall ADLs: States decreased ability to lift objects in a supinated and elbow flexing way, holding home objects with palm, driving can be painful, difficulty with workout routines and sometimes holding his child.   FUNCTIONAL OUTCOME MEASURES: Eval: Patient Specific Functional Scale: 4 (lifting weights, holding home objects with palm up, driving)  (Higher Score  =  Better Ability for the Selected Tasks)      UPPER EXTREMITY ROM     Shoulder to Wrist AROM Right eval Left eval Lt 07/09/23  Shoulder flexion     Shoulder abduction     Shoulder extension     Shoulder internal rotation  49 52  Shoulder external rotation  95 95  Elbow flexion     Elbow extension     Forearm supination 92 80 radial pain 90 not painful  Forearm pronation  80 78 85  Wrist flexion 82 77 83  Wrist extension 60 70 74  Wrist ulnar deviation     Wrist radial deviation     Functional dart thrower's motion (F-DTM) in ulnar flexion     F-DTM in radial extension      (Blank rows = not tested)   UPPER EXTREMITY MMT:     MMT Right 06/30/23 Left 06/30/23  Shoulder flexion    Shoulder abduction    Shoulder adduction    Shoulder extension    Shoulder internal rotation 5/5 5/5  Shoulder external rotation 4-/5 4/5  Middle trapezius    Lower trapezius    Elbow flexion  5/5  Elbow extension  5/5  Forearm supination  4/5 tender  Forearm pronation  5/5  Wrist flexion  4+/5  Wrist extension  4/5  Wrist ulnar deviation    Wrist radial deviation    (Blank rows = not tested)  HAND FUNCTION: Eval: No significant observed weakness  for hand strength or gripping, though may be mildly tender to the medial epicondyle and pulling through the hand and wrist extensors.  Details TBD as helpful  COORDINATION: Eval: No significant observed coordination deficits other than some pain in the end range of motion or repetitive motion due to apparent tendinitis and nerve irritation.  SENSATION: Eval:  Light touch intact today, and no real complaints of paresthesia, though does have some pain symptoms similar to nerve pain through the radial tunnel area.  OBSERVATIONS:   Eval:  positive radial nerve tension test, positive  pain with resisted wrist ext, no pain with biceps/triceps activation, TTP to Lt tricep with muscle spasm, apparently tight shoulder internal rotation with cramping and tender external rotators.  Presents as chain tightness from the shoulder external rotators, down the tricep and into the lateral epicondyle.  This presents as an interesting blend of radial tunnel and lateral epicondylitis type syndromes.    TODAY'S TREATMENT:  07/09/23: He starts with active range of motion for exercise as well as new measures which shows slight improvements after a week of stretching and following therapy recommendations.  Next, OT puts his arm on moist heat for approximately 3 to 4 minutes while visually reviewing and discussing his home exercises.  After that, OT does manual therapy IASTM for myofascial release through the left lateral forearm and into the tricep area somewhat.  OT also discusses cross friction massaging, benefits of therapeutic massage and other techniques to work on spasms or areas of tightness.  He states feeling better and looser after manual therapy.  Lastly, OT has him review his home exercises as listed below, upgrading to now include eccentric bicep and wrist extension with 4 pound weight.  He does this without pain approximately 10-15 reps and is not sore at the end.  He is asked to carefully do this and build  up his sets and reps versus increasing weight.  He should never do this if it is painful.  Again we discussed modifying certain activities to prevent pressure on the lateral epicondyle.  He is planning on playing some tennis, so OT advises to stretch and warm up his right extremity as well.   Exercises - Sleeper Stretch  - 3-4 x daily - 3-5 reps - 15 hold - Tricep Stretch- DO SEATED BY TABLE  - 3-4 x daily - 3-5 reps - 15 hold - Seated Wrist Flexion Stretch  - 3-4 x daily - 3 reps - 15 second  hold - Standing Radial Nerve Glide  - 4-6 x daily - 1 sets - 10-15 reps - Eccentric Bicep Strength  - 2-4 x daily - 4-5 x weekly - 1 sets - 5-10 reps - 10 sec hold    PATIENT EDUCATION: Education details: See tx section above for details  Person educated: Patient Education method: Verbal Instruction, Teach back, Handouts  Education comprehension: States and demonstrates understanding, Additional Education required    HOME EXERCISE PROGRAM: Access Code: [email or web address] URL: [email or web address] Date: 06/30/2023 Prepared by: Melvenia Ada   GOALS: Goals reviewed with patient? Yes   SHORT TERM GOALS: (STG required if POC>30 days) Target Date: 07/11/2023  Pt will demo/state understanding of initial HEP to improve pain levels and prerequisite motion. Goal status: 07/09/23: MET   LONG TERM GOALS: Target Date: 08/15/2023  Pt will improve functional ability by decreased impairment per PSFS assessment from 4 to 7 or better, for better quality of life. Goal status: INITIAL  2.  Pt will improve A/ROM in left forearm supination from painful 80 degrees to at least nonpainful 85 degrees, to have functional motion for tasks like reach and grasp.  Goal status: INITIAL  3.  Pt will improve strength in left forearm supination and wrist extension from tender 4/5 MMT to at least nonpainful 5/5 MMT to have increased functional ability to carry out selfcare and higher-level homecare tasks with  less difficulty. Goal status: INITIAL  4.  Pt will decrease pain at worst from 6-7/10 to 2/10 or better to have better sleep and occupational participation in daily roles. Goal status:  INITIAL   ASSESSMENT:  CLINICAL IMPRESSION: 07/09/23: He is doing well, less pain, tolerating eccentric strengthening, continue on building up strength slowly.  Consider eccentric forearm rotations or radial deviation in the next session  Eval: Patient is a 43 y.o. male who was seen today for occupational therapy evaluation for pain in the left dorsal forearm (and some mild intermittent complaints of medial forearm pain at times, which is likely pain from compensation/overuse), that presents as an interesting blend of radial tunnel syndrome as well as mild lateral epicondylitis.  It is also exacerbated by stiffness that runs through his shoulder and tricep.  The patient will benefit from outpatient occupational therapy to decrease symptoms, improve functional upper extremity use, and increase quality of life.     PLAN:  OT FREQUENCY: 1-2x/week  OT DURATION: 6 weeks through 08/15/23 and up to 7 total visits as needed   PLANNED INTERVENTIONS: 97535 self care/ADL training, 02889 therapeutic exercise, 97530 therapeutic activity, 97112 neuromuscular re-education, 97140 manual therapy, 97035 ultrasound, 97032 electrical stimulation (manual), 97760 Orthotic Initial, [id number] Orthotic/Prosthetic subsequent, compression bandaging, Dry needling, energy conservation, coping strategies training, and patient/family education   CONSULTED AND AGREED WITH PLAN OF CARE: Patient  PLAN FOR NEXT SESSION:   Consider eccentric forearm rotations or radial deviation in the next session  Melvenia Ada, OTR/L, CHT  07/09/2023, 4:21 PM   "

## 2023-07-09 ENCOUNTER — Ambulatory Visit (INDEPENDENT_AMBULATORY_CARE_PROVIDER_SITE_OTHER): Admitting: Rehabilitative and Restorative Service Providers"

## 2023-07-09 ENCOUNTER — Encounter: Payer: Self-pay | Admitting: Rehabilitative and Restorative Service Providers"

## 2023-07-09 DIAGNOSIS — M79632 Pain in left forearm: Secondary | ICD-10-CM

## 2023-07-09 DIAGNOSIS — M25612 Stiffness of left shoulder, not elsewhere classified: Secondary | ICD-10-CM | POA: Diagnosis not present

## 2023-07-09 DIAGNOSIS — M6281 Muscle weakness (generalized): Secondary | ICD-10-CM

## 2023-07-13 ENCOUNTER — Other Ambulatory Visit: Payer: Self-pay | Admitting: Internal Medicine

## 2023-07-13 DIAGNOSIS — F411 Generalized anxiety disorder: Secondary | ICD-10-CM

## 2023-07-14 NOTE — Therapy (Incomplete)
 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 - follows with [facility], neg cx 07/2013   Depression    GERD (gastroesophageal reflux disease)    w/ HH   H/O alcohol abuse    sober since 02/04/2007   Hypothyroid    Past Surgical History:  Procedure Laterality Date   WRIST SURGERY Left    Patient Active Problem List   Diagnosis Date Noted   Acne vulgaris 06/19/2023   Dermatitis 06/19/2023   Encounter for long-term current use of medication 06/19/2023   History of skin disorder 06/19/2023   Hyperpigmentation 06/19/2023   Rosacea 06/19/2023   Pruritus 06/19/2023   Radial tunnel syndrome, left 06/04/2023   Hypogonadism male 05/15/2023   GAD (generalized anxiety disorder) 04/24/2023   Melanocytic nevi of trunk 02/03/2023   Encounter for general adult medical examination with abnormal findings 02/03/2023   Gastroesophageal reflux disease without esophagitis 01/10/2022   Motion sickness 11/15/2021   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   Rotator cuff tendinitis, right 01/18/2021   Tinea pedis of both feet 12/03/2019   Renal cyst, left 03/24/2016   Routine general medical examination at a health care facility 11/22/2015    ONSET DATE: Approximately 1 month onset of pain  REFERRING DIAG: G56.32 (ICD-10-CM) - Radial tunnel syndrome, left   THERAPY DIAG:  No diagnosis found.  Rationale for Evaluation and Treatment: Rehabilitation  PERTINENT HISTORY: He does state past history of Lt wrist surgery resulting from problems arising from repetitive tennis playing. He states that for about a month he has had worsening pain in the left forearm near the lateral epicondyle and through  the dorsum of the forearm.  He also intermittently and occasionally has pain in the medial epicondyle.  These things are worst when lifting weights and curling with the palm up.  Rotational motions of the forearm also hurt him with activation of the supinator muscle.  He describes his pain as sometimes a shooting pain but also as a soreness in the muscle bellies as well.  Recent injection helped, but pain has come back.  He plays tennis and is physically active and also has to lift and carry his child at times.     PRECAUTIONS: None  RED FLAGS: None   WEIGHT BEARING RESTRICTIONS: No   SUBJECTIVE:   SUBJECTIVE STATEMENT: He states ***    PAIN:  Are you having pain? Yes: NPRS scale: *** 0/10 at rest but up to ** 4-5/10 at worst in the past week when curling with the palm up position. Pain location: Mainly left lateral elbow area and forearm Pain description: Soreness and sometimes sharp and shooting Aggravating factors: Supination or wrist extension Relieving factors: Rest    PATIENT GOALS: To improve pain and symptoms in the left nondominant arm and to prevent these things from starting in the right arm as well.  NEXT MD VISIT: As needed  OBJECTIVE: (All objective assessments below are from initial evaluation on: 06/30/2023 unless otherwise specified.)   HAND DOMINANCE: Right   ADLs: Overall ADLs: States decreased ability to lift objects in a supinated and elbow flexing way, holding home objects with palm, driving can be painful, difficulty with workout routines  and sometimes holding his child.   FUNCTIONAL OUTCOME MEASURES: Eval: Patient Specific Functional Scale: 4 (lifting weights, holding home objects with palm up, driving)  (Higher Score  =  Better Ability for the Selected Tasks)      UPPER EXTREMITY ROM     Shoulder to Wrist AROM Right eval Left eval Lt 07/09/23  Shoulder flexion     Shoulder abduction     Shoulder extension     Shoulder internal rotation  49 52   Shoulder external rotation  95 95  Elbow flexion     Elbow extension     Forearm supination 92 80 radial pain 90 not painful  Forearm pronation  80 78 85  Wrist flexion 82 77 83  Wrist extension 60 70 74  Wrist ulnar deviation     Wrist radial deviation     Functional dart thrower's motion (F-DTM) in ulnar flexion     F-DTM in radial extension      (Blank rows = not tested)   UPPER EXTREMITY MMT:     MMT Right 06/30/23 Left 06/30/23  Shoulder flexion    Shoulder abduction    Shoulder adduction    Shoulder extension    Shoulder internal rotation 5/5 5/5  Shoulder external rotation 4-/5 4/5  Middle trapezius    Lower trapezius    Elbow flexion  5/5  Elbow extension  5/5  Forearm supination  4/5 tender  Forearm pronation  5/5  Wrist flexion  4+/5  Wrist extension  4/5  Wrist ulnar deviation    Wrist radial deviation    (Blank rows = not tested)  HAND FUNCTION: Eval: No significant observed weakness for hand strength or gripping, though may be mildly tender to the medial epicondyle and pulling through the hand and wrist extensors.  Details TBD as helpful  COORDINATION: Eval: No significant observed coordination deficits other than some pain in the end range of motion or repetitive motion due to apparent tendinitis and nerve irritation.  SENSATION: Eval:  Light touch intact today, and no real complaints of paresthesia, though does have some pain symptoms similar to nerve pain through the radial tunnel area.  OBSERVATIONS:   Eval:  positive radial nerve tension test, positive pain with resisted wrist ext, no pain with biceps/triceps activation, TTP to Lt tricep with muscle spasm, apparently tight shoulder internal rotation with cramping and tender external rotators.  Presents as chain tightness from the shoulder external rotators, down the tricep and into the lateral epicondyle.  This presents as an interesting blend of radial tunnel and lateral epicondylitis type  syndromes.    TODAY'S TREATMENT:  07/16/23: *** Consider eccentric forearm rotations or radial deviation in the next session   07/09/23: He starts with active range of motion for exercise as well as new measures which shows slight improvements after a week of stretching and following therapy recommendations.  Next, OT puts his arm on moist heat for approximately 3 to 4 minutes while visually reviewing and discussing his home exercises.  After that, OT does manual therapy IASTM for myofascial release through the left lateral forearm and into the tricep area somewhat.  OT also discusses cross friction massaging, benefits of therapeutic massage and other techniques to work on spasms or areas of tightness.  He states feeling better and looser after manual therapy.  Lastly, OT has him review his home exercises as listed below, upgrading to now include eccentric bicep and wrist extension with 4 pound weight.  He does this  without pain approximately 10-15 reps and is not sore at the end.  He is asked to carefully do this and build up his sets and reps versus increasing weight.  He should never do this if it is painful.  Again we discussed modifying certain activities to prevent pressure on the lateral epicondyle.  He is planning on playing some tennis, so OT advises to stretch and warm up his right extremity as well.   Exercises - Sleeper Stretch  - 3-4 x daily - 3-5 reps - 15 hold - Tricep Stretch- DO SEATED BY TABLE  - 3-4 x daily - 3-5 reps - 15 hold - Seated Wrist Flexion Stretch  - 3-4 x daily - 3 reps - 15 second  hold - Standing Radial Nerve Glide  - 4-6 x daily - 1 sets - 10-15 reps - Eccentric Bicep Strength  - 2-4 x daily - 4-5 x weekly - 1 sets - 5-10 reps - 10 sec hold    PATIENT EDUCATION: Education details: See tx section above for details  Person educated: Patient Education method: Verbal Instruction, Teach back, Handouts  Education comprehension: States and demonstrates understanding,  Additional Education required    HOME EXERCISE PROGRAM: Access Code: [email or web address] URL: [email or web address] Date: 06/30/2023 Prepared by: Melvenia Ada   GOALS: Goals reviewed with patient? Yes   SHORT TERM GOALS: (STG required if POC>30 days) Target Date: 07/11/2023  Pt will demo/state understanding of initial HEP to improve pain levels and prerequisite motion. Goal status: 07/09/23: MET   LONG TERM GOALS: Target Date: 08/15/2023  Pt will improve functional ability by decreased impairment per PSFS assessment from 4 to 7 or better, for better quality of life. Goal status: INITIAL  2.  Pt will improve A/ROM in left forearm supination from painful 80 degrees to at least nonpainful 85 degrees, to have functional motion for tasks like reach and grasp.  Goal status: INITIAL  3.  Pt will improve strength in left forearm supination and wrist extension from tender 4/5 MMT to at least nonpainful 5/5 MMT to have increased functional ability to carry out selfcare and higher-level homecare tasks with less difficulty. Goal status: INITIAL  4.  Pt will decrease pain at worst from 6-7/10 to 2/10 or better to have better sleep and occupational participation in daily roles. Goal status: INITIAL   ASSESSMENT:  CLINICAL IMPRESSION: 07/16/23: ***  07/09/23: He is doing well, less pain, tolerating eccentric strengthening, continue on building up strength slowly.  Consider eccentric forearm rotations or radial deviation in the next session  Eval: Patient is a 43 y.o. male who was seen today for occupational therapy evaluation for pain in the left dorsal forearm (and some mild intermittent complaints of medial forearm pain at times, which is likely pain from compensation/overuse), that presents as an interesting blend of radial tunnel syndrome as well as mild lateral epicondylitis.  It is also exacerbated by stiffness that runs through his shoulder and tricep.  The patient will benefit  from outpatient occupational therapy to decrease symptoms, improve functional upper extremity use, and increase quality of life.     PLAN:  OT FREQUENCY: 1-2x/week  OT DURATION: 6 weeks through 08/15/23 and up to 7 total visits as needed   PLANNED INTERVENTIONS: 97535 self care/ADL training, 02889 therapeutic exercise, 97530 therapeutic activity, 97112 neuromuscular re-education, 97140 manual therapy, 97035 ultrasound, [id number] electrical stimulation (manual), [id number] Orthotic Initial, [id number] Orthotic/Prosthetic subsequent, compression bandaging, Dry needling, energy conservation, coping strategies training, and patient/family education   CONSULTED  AND AGREED WITH PLAN OF CARE: Patient  PLAN FOR NEXT SESSION:   ***  Melvenia Ada, OTR/L, CHT  07/14/2023, 5:08 PM

## 2023-07-16 ENCOUNTER — Encounter: Admitting: Rehabilitative and Restorative Service Providers"

## 2023-07-23 ENCOUNTER — Encounter: Admitting: Rehabilitative and Restorative Service Providers"

## 2023-07-29 ENCOUNTER — Other Ambulatory Visit (HOSPITAL_COMMUNITY): Payer: Self-pay

## 2023-07-29 ENCOUNTER — Other Ambulatory Visit: Payer: Self-pay

## 2023-07-30 ENCOUNTER — Other Ambulatory Visit: Payer: Self-pay

## 2023-07-30 ENCOUNTER — Encounter: Admitting: Rehabilitative and Restorative Service Providers"

## 2023-07-31 ENCOUNTER — Ambulatory Visit: Payer: Self-pay | Admitting: Allergy

## 2023-07-31 DIAGNOSIS — E291 Testicular hypofunction: Secondary | ICD-10-CM | POA: Diagnosis not present

## 2023-07-31 DIAGNOSIS — E559 Vitamin D deficiency, unspecified: Secondary | ICD-10-CM | POA: Diagnosis not present

## 2023-07-31 DIAGNOSIS — R5383 Other fatigue: Secondary | ICD-10-CM | POA: Diagnosis not present

## 2023-07-31 DIAGNOSIS — Z125 Encounter for screening for malignant neoplasm of prostate: Secondary | ICD-10-CM | POA: Diagnosis not present

## 2023-07-31 DIAGNOSIS — E039 Hypothyroidism, unspecified: Secondary | ICD-10-CM | POA: Diagnosis not present

## 2023-07-31 DIAGNOSIS — E538 Deficiency of other specified B group vitamins: Secondary | ICD-10-CM | POA: Diagnosis not present

## 2023-08-06 ENCOUNTER — Ambulatory Visit: Payer: BC Managed Care – PPO | Admitting: Internal Medicine

## 2023-08-06 ENCOUNTER — Encounter: Payer: Self-pay | Admitting: Internal Medicine

## 2023-08-06 VITALS — BP 124/78 | HR 60 | Temp 97.9°F | Resp 16 | Ht 70.0 in | Wt 175.4 lb

## 2023-08-06 DIAGNOSIS — K219 Gastro-esophageal reflux disease without esophagitis: Secondary | ICD-10-CM

## 2023-08-06 DIAGNOSIS — Z23 Encounter for immunization: Secondary | ICD-10-CM

## 2023-08-06 NOTE — Patient Instructions (Signed)
 GERD in Adults: What to Know  Gastroesophageal reflux (GER) is when acid from your stomach flows up into your esophagus. Your esophagus is the part of your body that moves food from your mouth to your stomach. Normally, food goes down and stays in your stomach to be digested. But with GER, food and stomach acid may go back up. You may have a disease called gastroesophageal reflux disease (GERD) if the reflux: Happens often. Causes very bad symptoms. Makes your esophagus sore and swollen. Over time, GERD can make small holes called ulcers in the lining of your esophagus. What are the causes? GERD is caused by a problem with the muscle between your esophagus and stomach. This muscle is called the lower esophageal sphincter (LES). When it's weak or not normal, it doesn't close like it should. This means food and stomach acid can go back up into your esophagus. The muscle can be weak if: You smoke or use products with tobacco in them. You're pregnant. You have a type of hernia called a hiatal hernia. You eat certain foods and drinks. These include: Alcohol. Coffee. Chocolate. Onions. Peppermint. What increases the risk? Being overweight. Having a disease that affects your connective tissue. Taking NSAIDs, such as ibuprofen. What are the signs or symptoms? Heartburn. Trouble swallowing. Pain when you swallow. The feeling of having a lump in your throat. A bitter taste in your mouth. Bad breath. Having an upset or bloated stomach. Burping. Chest pain. Other conditions can also cause chest pain. Make sure you see your health care provider if you have chest pain. Wheezing. This is when you make high-pitched whistling sounds when you breathe, most often when you breathe out. A long-term cough or a cough at night. How is this diagnosed? GERD may be diagnosed based on your medical history and a physical exam. You may also have tests. These may include: An endoscopy. This test looks at your  stomach and esophagus with a small camera. A barium swallow test. This shows the shape and size of your esophagus and how well it's working. Tests of your esophagus to check for: Acid levels. Pressure. How is this treated? Treatment may depend on how bad your symptoms are. It may include: Changes to your diet and daily life. Medicines. Surgery. Follow these instructions at home: Eating and drinking Follow an eating plan as told by your provider. You may need to avoid certain foods and drinks. These may include: Coffee and tea, with or without caffeine. Alcohol. Energy drinks and sports drinks. Fizzy drinks or sodas. Chocolate and cocoa. Peppermint and mint flavorings. Garlic and onions. Horseradish. Spicy and acidic foods. These include: Peppers. Chili powder and curry powder. Vinegar. Hot sauces and BBQ sauce. Citrus fruits and juices. These include: Oranges. Lemons. Limes. Tomato-based foods. These include: Red sauce and pizza with red sauce. Chili. Salsa. Fried and fatty foods. These include: Donuts. [place] fries. Potato chips. High-fat dressings. High-fat meats. These include: Hot dogs and sausage. Rib eye steak. Ham and bacon. High-fat dairy items. These include: Whole milk. Butter. Cream cheese. Eat small meals often. Avoid eating big meals. Avoid drinking lots of liquid with your meals. Try not to eat meals during the 2-3 hours before bedtime. Try not to lie down right after you eat. Do not exercise right after you eat. Lifestyle  If you're overweight, lose an amount of weight that's healthy for you. Ask your provider about a safe weight loss goal. Do not smoke, vape, or use nicotine or tobacco. Wear  loose clothes. Do not wear things that are tight around your waist. When you sleep, try: Raising the head of your bed about 6 inches (15 cm). You can use a wedge to do this. Lying down on your left side. Try to lower your stress. If you need help doing  this, ask your provider. General instructions Take your medicines only as told. Do not take aspirin or ibuprofen unless you're told to. Watch for any changes in your symptoms. Do not bend over if it makes your symptoms worse. Contact a health care provider if: You have new symptoms. You have trouble: Drinking. Swallowing. Eating. It hurts to swallow. You have wheezing. You have a cough that won't go away. Your voice is hoarse. Your symptoms don't get better with treatment. Get help right away if: You have pain all of a sudden in your: Arm. Neck. Jaw. Teeth. Back. You feel sweaty, dizzy, or light-headed all of a sudden. You faint. You have chest pain or shortness of breath. You vomit and the vomit is: Green, yellow, or black. Looks like blood or coffee grounds. Your poop is red, bloody, or black. These symptoms may be an emergency. Call 911 right away. Do not wait to see if the symptoms will go away. Do not drive yourself to the hospital. This information is not intended to replace advice given to you by your health care provider. Make sure you discuss any questions you have with your health care provider. Document Revised: 11/19/2022 Document Reviewed: 06/05/2022 Elsevier Patient Education  2024 [employer].

## 2023-08-06 NOTE — Progress Notes (Unsigned)
 Subjective:  Patient ID: Jacob [name], male    DOB: Oct 26, 1980  Age: 43 y.o. MRN: [id number]  CC: Gastroesophageal Reflux   HPI Jacob [name] presents for f/up ----  Discussed the use of AI scribe software for clinical note transcription with the patient, who gave verbal consent to proceed.  History of Present Illness   Jacob [name] is a 43 year old male who presents for follow-up after a gastrointestinal illness and to discuss testosterone  therapy.He experienced a gastrointestinal illness with severe diarrhea, leading to fecal incontinence for two consecutive nights, necessitating the use of adult diapers. His father and Izetta had similar symptoms. Currently, symptoms including diarrhea, nausea, vomiting, abdominal pain, blood in the stool, and painful bowel movements have resolved.He has low libido, which he attributes to potentially decreasing testosterone  levels. He has been on Clomid  for about a decade, initially maintaining testosterone  levels around 900, but recent levels have decreased to around 500. No abdominal or testicular pain or swelling is reported.He takes [place] once daily without symptoms when adhering to this regimen. He also takes Cymbalta  and has attempted to reduce the dose but did not feel well. Hydroxyzine  is used as needed for itching, which occurs without a rash. He has a history of allergies and has used sublingual allergy drops for five years, which he believes have improved his condition. 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 He has had two foot surgeries in the past due to issues from ill-fitting running shoes.No current weakness, dizziness, lightheadedness, abdominal pain, nausea, vomiting, diarrhea, blood in the stool, painful bowel movements, abdominal pain, or testicular pain or swelling. Reports itching without a rash.       Outpatient  Medications Prior to Visit  Medication Sig Dispense Refill   budesonide -formoterol  (SYMBICORT ) 80-4.5 MCG/ACT inhaler Inhale 2 puffs into the lungs 2 (two) times daily. 30.6 g 3   clomiPHENE  (CLOMID ) 50 MG tablet Take 1 tablet (50 mg total) by mouth daily. 90 tablet 1   DULoxetine  (CYMBALTA ) 30 MG capsule TAKE 1 CAPSULE (30 MG TOTAL) BY MOUTH DAILY. MUST KEEP APPT FOR FUTURE REFILLS 90 capsule 1   hydrOXYzine  (VISTARIL ) 25 MG capsule TAKE 1 CAPSULE BY MOUTH EVERY 6 HOURS AS NEEDED FOR ITCHING. 360 capsule 0   methylPREDNISolone  (MEDROL  DOSEPAK) 4 MG TBPK tablet As directed 21 tablet 0   rosuvastatin  (CRESTOR ) 10 MG tablet Take 1 tablet (10 mg total) by mouth daily. 90 tablet 4   thyroid  (NP THYROID ) 120 MG tablet Take 1 tablet (120 mg total) by mouth every morning, along with 30mg  tablet to equal 150mg  daily 90 tablet 3   thyroid  (NP THYROID ) 30 MG tablet Take 1 tablet (30 mg total) by mouth every morning, along with 120mg  tablet to equal 150mg  daily 90 tablet 3   Vonoprazan Fumarate  (VOQUEZNA ) 10 MG TABS Take 1 tablet by mouth daily. 90 tablet 1   No facility-administered medications prior to visit.    ROS Review of Systems  Objective:  BP 124/78 (BP Location: Left Arm, Patient Position: Sitting, Cuff Size: Normal)   Pulse 60   Temp 97.9 F (36.6 C) (Oral)   Resp 16   Ht 5' 10 (1.778 m)   Wt 175 lb 6.4 oz (79.6 kg)   SpO2 98%   BMI 25.17 kg/m   BP Readings from Last 3 Encounters:  08/06/23 124/78  06/19/23 102/78  06/04/23 102/78  Wt Readings from Last 3 Encounters:  08/06/23 175 lb 6.4 oz (79.6 kg)  06/19/23 176 lb (79.8 kg)  05/28/23 174 lb (78.9 kg)    Physical Exam  Lab Results  Component Value Date   WBC 9.6 06/19/2023   HGB 15.1 06/19/2023   HCT 43.6 06/19/2023   PLT 259.0 06/19/2023   GLUCOSE 66 (L) 06/19/2023   CHOL 166 12/06/2022   TRIG 73 12/06/2022   HDL 68 12/06/2022   LDLCALC 84 12/06/2022   ALT 25 06/19/2023   AST 22 06/19/2023   NA 138  06/19/2023   K 4.1 06/19/2023   CL 104 06/19/2023   CREATININE 1.09 06/19/2023   BUN 22 06/19/2023   CO2 28 06/19/2023   TSH 1.48 06/19/2023   PSA 0.53 01/10/2021   HGBA1C 5.4 12/06/2022    CT CORONARY MORPH W/CTA COR W/SCORE W/CA W/CM &/OR WO/CM Addendum Date: 12/31/2022 ADDENDUM REPORT: 12/31/2022 23:56 EXAM: OVER-READ INTERPRETATION  CT CHEST The following report is an over-read performed by radiologist Dr. Suzen Dials of [facility] on 12/31/2022. This over-read does not include interpretation of cardiac or coronary anatomy or pathology. The coronary calcium  score/coronary CTA interpretation by the cardiologist is attached. COMPARISON:  March 18, 2019 FINDINGS: Cardiovascular: There are no significant extracardiac vascular findings. Mediastinum/Nodes: There are no enlarged lymph nodes within the visualized mediastinum. Lungs/Pleura: There is no pleural effusion. The visualized lungs appear clear. Upper abdomen: No significant findings in the visualized upper abdomen. Musculoskeletal/Chest wall: No chest wall mass or suspicious osseous findings within the visualized chest. IMPRESSION: No significant extracardiac findings within the visualized chest. Electronically Signed   By: Suzen Dials M.D.   On: 12/31/2022 23:56   Result Date: 12/31/2022 CLINICAL DATA:  43 year old with chest pain EXAM: Cardiac/Coronary  CTA TECHNIQUE: The patient was scanned on a [employer]. FINDINGS: A 100 kV prospective scan was triggered in the descending thoracic aorta at 111 HU's. Axial non-contrast 3 mm slices were carried out through the heart. The data set was analyzed on a dedicated work station and scored using the Agatson method. Gantry rotation speed was 250 msecs and collimation was .6 mm. 0.8 mg of sl NTG was given. The 3D data set was reconstructed in 5% intervals of the 67-82 % of the R-R cycle. Diastolic phases were analyzed on a dedicated work station using MPR, MIP and  VRT modes. The patient received 80 cc of contrast. Image quality: good Aorta:  Normal size (28 mm).  No calcifications.  No dissection. Aortic Valve:  Trileaflet.  No calcifications. Coronary Arteries:  Normal coronary origin.  Right dominance. RCA is a large dominant artery that gives rise to PDA and PLA. There is no plaque. Left main is a large artery that gives rise to LAD and LCX arteries. LAD is a large vessel that has no plaque. D1-large normal LCX is a non-dominant artery.  There is no plaque. OM1-large normal Other findings: Normal pulmonary vein drainage into the left atrium. Normal left atrial appendage without a thrombus. Normal size of the pulmonary artery. Please see radiology report for non cardiac findings. IMPRESSION: 1. Coronary calcium  score of 0. This was <1 percentile for age and sex matched control. 2. Total plaque volume (TPV) 0 mm3. 3. Normal coronary origin with right dominance. 4. No evidence of CAD. CAD-RADS 0. No evidence of CAD (0%). Consider non-atherosclerotic causes of chest pain. Electronically Signed: By: Oneil Parchment M.D. On: 12/18/2022 11:24    Assessment &  Plan:  There are no diagnoses linked to this encounter.   Follow-up: No follow-ups on file.  Debby Molt, MD

## 2023-08-08 ENCOUNTER — Other Ambulatory Visit (HOSPITAL_COMMUNITY): Payer: Self-pay

## 2023-08-15 ENCOUNTER — Other Ambulatory Visit (HOSPITAL_COMMUNITY): Payer: Self-pay

## 2023-08-15 DIAGNOSIS — E291 Testicular hypofunction: Secondary | ICD-10-CM | POA: Diagnosis not present

## 2023-08-15 DIAGNOSIS — R5383 Other fatigue: Secondary | ICD-10-CM | POA: Diagnosis not present

## 2023-08-15 DIAGNOSIS — E039 Hypothyroidism, unspecified: Secondary | ICD-10-CM | POA: Diagnosis not present

## 2023-08-15 DIAGNOSIS — K219 Gastro-esophageal reflux disease without esophagitis: Secondary | ICD-10-CM | POA: Diagnosis not present

## 2023-08-18 ENCOUNTER — Other Ambulatory Visit (HOSPITAL_COMMUNITY): Payer: Self-pay

## 2023-10-07 ENCOUNTER — Encounter: Payer: Self-pay | Admitting: Internal Medicine

## 2023-10-07 DIAGNOSIS — Z79899 Other long term (current) drug therapy: Secondary | ICD-10-CM | POA: Diagnosis not present

## 2023-10-07 DIAGNOSIS — L700 Acne vulgaris: Secondary | ICD-10-CM | POA: Diagnosis not present

## 2023-10-10 ENCOUNTER — Encounter: Payer: Self-pay | Admitting: Internal Medicine

## 2023-10-10 ENCOUNTER — Telehealth: Payer: Self-pay

## 2023-10-10 NOTE — Telephone Encounter (Signed)
 Copied from CRM [id number]. Topic: Clinical - Medical Advice >> Oct 10, 2023  8:52 AM [place] E wrote: Reason for CRM: Patient would like to speak with PCP's nurse, as he needs the OK from PCP for his dermatologist to prescribe him Accutane. Patient would like this to be faxed to [phone] with the release to take medication.

## 2023-10-10 NOTE — Telephone Encounter (Signed)
 This is being handled by Mychart . Closing the encounter.

## 2023-10-20 DIAGNOSIS — Z125 Encounter for screening for malignant neoplasm of prostate: Secondary | ICD-10-CM | POA: Diagnosis not present

## 2023-10-20 DIAGNOSIS — E291 Testicular hypofunction: Secondary | ICD-10-CM | POA: Diagnosis not present

## 2023-10-20 DIAGNOSIS — R7989 Other specified abnormal findings of blood chemistry: Secondary | ICD-10-CM | POA: Diagnosis not present

## 2023-10-20 DIAGNOSIS — R5383 Other fatigue: Secondary | ICD-10-CM | POA: Diagnosis not present

## 2023-10-20 DIAGNOSIS — E348 Other specified endocrine disorders: Secondary | ICD-10-CM | POA: Diagnosis not present

## 2023-10-20 LAB — COMPREHENSIVE METABOLIC PANEL WITH GFR
Albumin: 3.9 (ref 3.5–5.0)
Calcium: 8.9 (ref 8.7–10.7)
eGFR: 90

## 2023-10-20 LAB — BASIC METABOLIC PANEL WITH GFR
BUN: 13 (ref 4–21)
CO2: 23 — AB (ref 13–22)
Chloride: 103 (ref 99–108)
Creatinine: 1.1 (ref 0.6–1.3)
Glucose: 84
Potassium: 4.8 meq/L (ref 3.5–5.1)
Sodium: 139 (ref 137–147)

## 2023-10-20 LAB — HEPATIC FUNCTION PANEL
ALT: 39 U/L (ref 10–40)
AST: 40 (ref 14–40)
Alkaline Phosphatase: 64 (ref 25–125)
Bilirubin, Total: 0.3

## 2023-10-20 LAB — TESTOSTERONE: Testosterone: 972

## 2023-10-20 LAB — PSA: PSA: 0.7

## 2023-11-06 ENCOUNTER — Other Ambulatory Visit (HOSPITAL_COMMUNITY): Payer: Self-pay

## 2023-11-07 ENCOUNTER — Encounter: Payer: Self-pay | Admitting: Internal Medicine

## 2023-11-07 ENCOUNTER — Other Ambulatory Visit: Payer: Self-pay | Admitting: Internal Medicine

## 2023-11-07 DIAGNOSIS — K219 Gastro-esophageal reflux disease without esophagitis: Secondary | ICD-10-CM

## 2023-11-14 ENCOUNTER — Encounter: Payer: Self-pay | Admitting: Internal Medicine

## 2023-11-17 DIAGNOSIS — D225 Melanocytic nevi of trunk: Secondary | ICD-10-CM | POA: Diagnosis not present

## 2023-11-17 DIAGNOSIS — L700 Acne vulgaris: Secondary | ICD-10-CM | POA: Diagnosis not present

## 2023-11-17 DIAGNOSIS — L814 Other melanin hyperpigmentation: Secondary | ICD-10-CM | POA: Diagnosis not present

## 2023-11-17 DIAGNOSIS — Z79899 Other long term (current) drug therapy: Secondary | ICD-10-CM | POA: Diagnosis not present

## 2023-12-05 ENCOUNTER — Other Ambulatory Visit: Payer: Self-pay | Admitting: Family

## 2023-12-05 DIAGNOSIS — F418 Other specified anxiety disorders: Secondary | ICD-10-CM

## 2023-12-10 ENCOUNTER — Encounter: Payer: Self-pay | Admitting: Internal Medicine

## 2023-12-12 ENCOUNTER — Other Ambulatory Visit: Payer: Self-pay | Admitting: Internal Medicine

## 2023-12-12 DIAGNOSIS — F418 Other specified anxiety disorders: Secondary | ICD-10-CM

## 2023-12-12 MED ORDER — DULOXETINE HCL 30 MG PO CPEP: 30.0000 mg | ORAL_CAPSULE | Freq: Every day | ORAL | 1 refills | Status: AC

## 2023-12-22 DIAGNOSIS — L700 Acne vulgaris: Secondary | ICD-10-CM | POA: Diagnosis not present

## 2023-12-22 DIAGNOSIS — Z79899 Other long term (current) drug therapy: Secondary | ICD-10-CM | POA: Diagnosis not present

## 2023-12-23 ENCOUNTER — Ambulatory Visit: Admitting: Family Medicine

## 2023-12-23 ENCOUNTER — Other Ambulatory Visit: Payer: Self-pay

## 2023-12-23 ENCOUNTER — Encounter: Payer: Self-pay | Admitting: Family Medicine

## 2023-12-23 VITALS — BP 142/82 | HR 89 | Ht 70.0 in | Wt 190.0 lb

## 2023-12-23 DIAGNOSIS — G8929 Other chronic pain: Secondary | ICD-10-CM | POA: Diagnosis not present

## 2023-12-23 DIAGNOSIS — M7581 Other shoulder lesions, right shoulder: Secondary | ICD-10-CM

## 2023-12-23 DIAGNOSIS — M25511 Pain in right shoulder: Secondary | ICD-10-CM

## 2023-12-23 NOTE — Progress Notes (Unsigned)
   I, Leotis Batter, CMA acting as a scribe for Artist Lloyd, MD.  Jacob [name] is a 43 y.o. male who presents to [facility] today for R shoulder pain. Pt was last seen for his shoulder on 04/07/23 and was given a R subacromial steroid injection and advised to cont HEP.  Today, pt reports exacerbation of right shoulder sx. Sx have responded well to steroid injections in the past. Sx causing night disturbance. Worsening sx over the past month. TTP at the top and side of the shoulder. Worse with IR.   Dx imaging: 11/23/19 R shoulder MRI   Pertinent review of systems: No fevers or chills  Relevant historical information: Chronic shoulder pain.  History of subacromial debridement right shoulder.   Exam:  BP (!) 142/82   Pulse 89   Ht 5' 10 (1.778 m)   Wt 190 lb (86.2 kg)   SpO2 98%   BMI 27.26 kg/m  General: Well Developed, well nourished, and in no acute distress.   MSK: Right shoulder normal appearing normal motion pain with abduction intact strength.    Lab and Radiology Results  Procedure: Real-time Ultrasound Guided Injection of right shoulder subacromial bursa Device: Philips Affiniti 50G/GE Logiq Images permanently stored and available for review in PACS Verbal informed consent obtained.  Discussed risks and benefits of procedure. Warned about infection, bleeding, hyperglycemia damage to structures among others. Patient expresses understanding and agreement Time-out conducted.   Noted no overlying erythema, induration, or other signs of local infection.   Skin prepped in a sterile fashion.   Local anesthesia: Topical Ethyl chloride.   With sterile technique and under real time ultrasound guidance: 40 mg of Kenalog  and 2 mL of Marcaine  injected into subacromial bursa. Fluid seen entering the bursa.   Completed without difficulty   Pain immediately resolved suggesting accurate placement of the medication.   Advised to call if  fevers/chills, erythema, induration, drainage, or persistent bleeding.   Images permanently stored and available for review in the ultrasound unit.  Impression: Technically successful ultrasound guided injection.        Assessment and Plan: 43 y.o. male with right shoulder pain.  This is a recurrence of a chronic problem due to rotator cuff tendinopathy and impingement.  Plan for repeat trial of physical therapy and subacromial injection.  If not improved significantly consider updated MRI.   PDMP not reviewed this encounter. Orders Placed This Encounter  Procedures   US  LIMITED JOINT SPACE STRUCTURES UP RIGHT(NO LINKED CHARGES)    Reason for Exam (SYMPTOM  OR DIAGNOSIS REQUIRED):   right shoulder pain    Preferred imaging location?:   [facility] referral to Physical Therapy    Referral Priority:   Routine    Referral Type:   Physical Medicine    Referral Reason:   Specialty Services Required    Requested Specialty:   Physical Therapy    Number of Visits Requested:   1   No orders of the defined types were placed in this encounter.    Discussed warning signs or symptoms. Please see discharge instructions. Patient expresses understanding.   The above documentation has been reviewed and is accurate and complete Artist Lloyd, M.D.

## 2023-12-23 NOTE — Patient Instructions (Addendum)
Thank you for coming in today.   I've referred you to Physical Therapy.  Let us know if you don't hear from them in one week.   You received an injection today. Seek immediate medical attention if the joint becomes red, extremely painful, or is oozing fluid.

## 2023-12-24 ENCOUNTER — Other Ambulatory Visit (HOSPITAL_COMMUNITY): Payer: Self-pay

## 2023-12-24 ENCOUNTER — Telehealth: Payer: Self-pay

## 2023-12-24 LAB — LIPID PANEL
Cholesterol: 167 (ref 0–200)
Triglycerides: 63 (ref 40–160)

## 2023-12-24 LAB — HEPATIC FUNCTION PANEL
ALT: 41 U/L — AB (ref 10–40)
AST: 81 — AB (ref 14–40)

## 2023-12-24 NOTE — Telephone Encounter (Signed)
 Pharmacy Patient Advocate Encounter   Received notification from Onbase that prior authorization for VOQUEZNA  10 MG PO TABS  is required/requested.   Insurance verification completed.   The patient is insured through [facility].   Per test claim: PA required; PA submitted to above mentioned insurance via Latent Key/confirmation #/EOC [id number] Status is pending

## 2023-12-26 NOTE — Telephone Encounter (Signed)
 Pharmacy Patient Advocate Encounter  Received notification from [facility] that Prior Authorization for Voquezna  10MG  tablets  has been APPROVED from 12/25/2023 to 12/24/2024   PA #/Case ID/Reference #: [id number]

## 2023-12-31 DIAGNOSIS — Z79899 Other long term (current) drug therapy: Secondary | ICD-10-CM | POA: Diagnosis not present

## 2024-01-05 DIAGNOSIS — R790 Abnormal level of blood mineral: Secondary | ICD-10-CM | POA: Diagnosis not present

## 2024-01-05 DIAGNOSIS — E291 Testicular hypofunction: Secondary | ICD-10-CM | POA: Diagnosis not present

## 2024-01-05 DIAGNOSIS — M25559 Pain in unspecified hip: Secondary | ICD-10-CM | POA: Diagnosis not present

## 2024-01-05 DIAGNOSIS — R5383 Other fatigue: Secondary | ICD-10-CM | POA: Diagnosis not present

## 2024-01-05 LAB — CBC AND DIFFERENTIAL
HCT: 51 (ref 41–53)
Hemoglobin: 16.9 (ref 13.5–17.5)
Platelets: 261 10*3/uL (ref 150–400)
WBC: 6.5

## 2024-01-06 ENCOUNTER — Encounter: Payer: Self-pay | Admitting: Internal Medicine

## 2024-01-06 ENCOUNTER — Encounter: Payer: Self-pay | Admitting: Physical Therapy

## 2024-01-06 ENCOUNTER — Ambulatory Visit: Admitting: Physical Therapy

## 2024-01-06 DIAGNOSIS — M79632 Pain in left forearm: Secondary | ICD-10-CM | POA: Diagnosis not present

## 2024-01-06 DIAGNOSIS — M25612 Stiffness of left shoulder, not elsewhere classified: Secondary | ICD-10-CM | POA: Diagnosis not present

## 2024-01-06 DIAGNOSIS — M6281 Muscle weakness (generalized): Secondary | ICD-10-CM | POA: Diagnosis not present

## 2024-01-06 NOTE — Therapy (Signed)
 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 - follows with [facility], neg cx 07/2013   Depression    GERD (gastroesophageal reflux disease)    w/ HH   H/O alcohol abuse    sober since 02/04/2007   Hypothyroid    Past Surgical History:  Procedure Laterality Date   WRIST SURGERY Left    Patient Active Problem List   Diagnosis Date Noted   Acne vulgaris 06/19/2023   Dermatitis 06/19/2023   Encounter for long-term current use of medication 06/19/2023   Rosacea 06/19/2023   Pruritus 06/19/2023   Radial tunnel syndrome, left 06/04/2023   Hypogonadism male 05/15/2023   GAD (generalized anxiety disorder) 04/24/2023   Melanocytic nevi of trunk 02/03/2023   Encounter for general adult medical examination with abnormal findings 02/03/2023   Gastroesophageal reflux disease without esophagitis 01/10/2022   Motion sickness 11/15/2021   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   Rotator cuff tendinitis, right 01/18/2021   Tinea pedis of both feet 12/03/2019   Renal cyst, left 03/24/2016   Routine general medical examination at a health care facility 11/22/2015    PCP: Joshua Debby CROME, MD   REFERRING PROVIDER: Joane Artist RAMAN, MD   REFERRING DIAG:  Diagnosis  M25.511,G89.29 (ICD-10-CM) - Chronic right shoulder pain    THERAPY DIAG:  Muscle weakness (generalized)  Pain in left forearm  Stiffness of left shoulder, not elsewhere classified  Rationale for Evaluation and  Treatment: Rehabilitation  ONSET DATE: about 1 month ago  SUBJECTIVE:                                                                                                                                                                                      SUBJECTIVE STATEMENT: Pt s/p   PERTINENT HISTORY: history of Lt wrist surgery resulting from problems arising from repetitive tennis playing.   PAIN:  NPRS scale: no pain at rest, mild pain noted with abduction and ER, 3/10 Pain location: long head of bicep anterior superior  Rt  shoulder Pain description: achy Aggravating factors: serving in tennis, pull ups, working out Relieving factors: Rt shoulder injection, rest  PRECAUTIONS: None  WEIGHT BEARING RESTRICTIONS: No  FALLS:  Has patient fallen in last 6 months? No LIVING ENVIRONMENT: Lives with: lives with their family Lives in: House/apartment Has following equipment at home: None  OCCUPATION: Office job  PLOF: Independent  PATIENT GOALS:workout and play tennis without pain  Next MD visit:   OBJECTIVE:   DIAGNOSTIC FINDINGS:  04/09/23 FINDINGS: There is no evidence of fracture or dislocation. There is no evidence of arthropathy or other focal bone abnormality. Soft tissues are unremarkable.   IMPRESSION: No acute displaced fracture or dislocation.  PATIENT SURVEYS:  Patient-Specific Activity Scoring Scheme  0 represents unable to perform. 10 represents able to perform at prior level. 0 1 2 3 4 5 6 7 8 9  10 (Date and Score)   Activity Eval  01/06/24    1. Serving in tennis  5    2. Pull ups 3     3. Bench press 3   4.    5.    Score 3.6    Total score = sum of the activity scores/number of activities Minimum detectable change (90%CI) for average score = 2 points Minimum detectable change (90%CI) for single activity score = 3 points  COGNITION: Overall cognitive status: WFL     SENSATION: WFL  POSTURE: Rounded shoulders and forward  head  UPPER EXTREMITY ROM:   ROM Right Eval Active sitting Left Eval Active sitting   Shoulder flexion 162 165  Shoulder extension 50 50  Shoulder abduction 166 170  Shoulder adduction    Shoulder internal rotation    Shoulder external rotation 74 90  Elbow flexion [facility] WFL  Elbow extension    Wrist flexion    Wrist extension    Wrist ulnar deviation    Wrist radial deviation    Wrist pronation    Wrist supination    (Blank rows = not tested)  UPPER EXTREMITY MMT:  MMT Right eval Left eval  Shoulder flexion 22.9 26.1 29.6 32.1  Shoulder extension    Shoulder abduction 26.0 25.0 27.5 29.2  Shoulder adduction    Shoulder internal rotation    Shoulder external rotation 26.3 31.2 31.5 29.3  Middle trapezius    Lower trapezius    Elbow flexion    Elbow extension    Wrist flexion    Wrist extension    Wrist ulnar deviation    Wrist radial deviation    Wrist pronation    Wrist supination    Grip strength (lbs)    (Blank rows = not tested)  SHOULDER SPECIAL TESTS: Rotator cuff assessment: Empty can test: positive  Rt  PALPATION:  TTP: long head of biceps  TODAY'S TREATMENT:                                                                                                       DATE: 01/06/24 Therex: HEP instruction/performance c cues for techniques, handout provided.  Trial set performed of each for comprehension and symptom assessment.  See below for exercise list Self Care:  Discussed gym accommodations to pt's current exercise program    PATIENT EDUCATION: Education details: HEP, POC Person educated: Patient Education method: Explanation, Demonstration, Verbal cues, and Handouts Education comprehension: verbalized understanding, returned demonstration, and  verbal cues required  HOME EXERCISE PROGRAM: Access Code: [email or web address] URL: [email or web address] Date: 01/06/2024 Prepared by: Delon Lunger  Exercises - Doorway Pec Stretch at 90 Degrees Abduction  - 2-3 x daily - 7 x weekly - 3-5 reps - 20 seconds hold - Standing Shoulder Row with Anchored Resistance  - 2-3 x daily - 7 x weekly - 3 sets - 10 reps - 3 seconds hold - Shoulder External Rotation Reactive Isometrics  - 2-3 x daily - 7 x weekly - 3 sets - 10 reps  ASSESSMENT:  CLINICAL IMPRESSION: Patient is a 43 y.o. male who plays tennis comes to clinic with complaints of Rt pain which has progressively worsened over the last months with mobility, strength and movement coordination deficits that impair their ability to perform usual daily and recreational functional activities without increase difficulty/symptoms at this time.  Pt stating the injection has helped with his pain. Patient to benefit from skilled PT services to address impairments and limitations to improve to previous level of function without restriction secondary to condition.   OBJECTIVE IMPAIRMENTS: decreased strength, impaired UE functional use, and pain.   ACTIVITY LIMITATIONS: lifting and sleeping  PARTICIPATION LIMITATIONS: community activity and recreation  PERSONAL FACTORS: PMH is unremarkable are also affecting patient's functional outcome.   REHAB POTENTIAL: Excellent  CLINICAL DECISION MAKING: Stable/uncomplicated  EVALUATION COMPLEXITY: Low   GOALS: Goals reviewed with patient? Yes  SHORT TERM GOALS: (target date for Short term goals are 3 weeks 01/27/2024)  1.Patient will demonstrate independent use of home exercise program to maintain progress from in clinic treatments. Goal status: New  LONG TERM GOALS: (target dates for all long term goals are 10 weeks  02/27/24 )   1. Patient will demonstrate/report pain at worst less than or equal to 2/10 to facilitate minimal limitation in daily  activity secondary to pain symptoms. Goal status: New   2. Patient will demonstrate independent use of home exercise program to facilitate ability to maintain/progress functional gains from skilled physical therapy services. Goal status: New   3. Patient will demonstrate Patient specific functional scale avg > or = 5.6 to indicate reduced disability due to condition.  Goal status: New   4.  Patient will demonstrate 5 pounds in HHD MMT in his Rt UE flexion, ER and abduction throughout to facilitate lifting, reaching, carrying at PLOF in daily activity.   Goal status: New   5.  Patient will demonstrate Rt  GH joint AROM WFL s symptoms to facilitate usual overhead  reaching, self care, dressing at PLOF.    Goal status: New   6.  Pt will be able to serve a tennis ball with pain </= 2/10 in his Rt shoulder. Goal status: New     PLAN:  PT FREQUENCY: 1-x/week  PT DURATION: 8 weeks  PLANNED INTERVENTIONS: Can include 02853- PT Re-evaluation, 97110-Therapeutic exercises, 97530- Therapeutic activity, [id number]- Neuromuscular re-education, 97535- Self Care, 97140- Manual therapy, [phone]- Gait training, [id number]- Orthotic Fit/training, [id number]- Canalith repositioning, [id number]- Aquatic Therapy, [phone]- Electrical stimulation (unattended), [id number] Physical performance testing, 97016- Vasopneumatic device, [id number]- Ultrasound, [id number]- Traction (mechanical), [id number]- Ionotophoresis 4mg /ml Dexamethasone ,  79439 - Needle insertion w/o injection 1 or 2 muscles, 20561 - Needle insertion w/o injection 3 or more muscles.   Patient/Family education, Balance training, Stair training, Taping, Dry Needling, Joint mobilization, Joint manipulation, Spinal manipulation, Spinal mobilization, Scar mobilization, Vestibular training, Visual/preceptual remediation/compensation, DME instructions, Cryotherapy, and Moist heat.  All performed as medically necessary.  All included unless contraindicated  PLAN FOR NEXT SESSION: Review HEP  knowledge/results. Discuss DN and consider as a treatment option, begin strengthening progression and updated HEP as needed   Delon JONELLE Lunger, PT, MPT 01/06/2024, 9:01 AM

## 2024-01-26 NOTE — Therapy (Signed)
 " OUTPATIENT PHYSICAL THERAPY TREATMENT   Patient Name: Jacob [name] MRN: [id number] DOB:02/05/1980, 44 y.o., male Today's Date: 01/27/2024  END OF SESSION:  PT End of Session - 01/27/24 0841     Visit Number 2    Number of Visits 8    Date for Recertification  02/27/24    Authorization Type [employer] 30% coinsurance - auth needed    PT Start Time 0840    PT Stop Time 0921    PT Time Calculation (min) 41 min    Activity Tolerance Patient tolerated treatment well    Behavior During Therapy [facility] for tasks assessed/performed           Past Medical History:  Diagnosis Date   Anxiety    Community acquired MRSA infection    recurrent, 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 - follows with [facility], neg cx 07/2013   Depression    GERD (gastroesophageal reflux disease)    w/ HH   H/O alcohol abuse    sober since 02/04/2007   Hypothyroid    Past Surgical History:  Procedure Laterality Date   WRIST SURGERY Left    Patient Active Problem List   Diagnosis Date Noted   Acne vulgaris 06/19/2023   Dermatitis 06/19/2023   Encounter for long-term current use of medication 06/19/2023   Rosacea 06/19/2023   Pruritus 06/19/2023   Radial tunnel syndrome, left 06/04/2023   Hypogonadism male 05/15/2023   GAD (generalized anxiety disorder) 04/24/2023   Melanocytic nevi of trunk 02/03/2023   Encounter for general adult medical examination with abnormal findings 02/03/2023   Gastroesophageal reflux disease without esophagitis 01/10/2022   Motion sickness 11/15/2021   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   Rotator cuff tendinitis, right 01/18/2021   Tinea pedis of both feet 12/03/2019   Renal cyst, left 03/24/2016   Routine general medical examination at a health care facility 11/22/2015    PCP: Joshua Debby CROME, MD   REFERRING PROVIDER: Joane Artist RAMAN, MD   REFERRING DIAG:  Diagnosis  M25.511,G89.29 (ICD-10-CM) - Chronic right shoulder pain    THERAPY DIAG:  Muscle weakness (generalized)  Pain in  left forearm  Stiffness of left shoulder, not elsewhere classified  Rationale for Evaluation and Treatment: Rehabilitation  ONSET DATE: about 1 month ago  SUBJECTIVE:                                                                                                                                                                                      SUBJECTIVE STATEMENT: Pt indicated some inconsistency with HEP with holidays, etc.  Pt indicated very active in tennis and indicated coming for therapy for imaging.  Pt indicated  less complaints since injection was noted.    Pt indicated playing tennis Saturday with OTC, no pain to report.  Reported serving in tennis seems to be the most trouble.   PERTINENT HISTORY: history of Lt wrist surgery resulting from problems arising from repetitive tennis playing.   PAIN:  NPRS scale:  Pain location: long head of bicep anterior superior  Rt shoulder Pain description: achy Aggravating factors: serving in tennis, pull ups, working out Relieving factors: Rt shoulder injection, rest  PRECAUTIONS: None  WEIGHT BEARING RESTRICTIONS: No  FALLS:  Has patient fallen in last 6 months? No LIVING ENVIRONMENT: Lives with: lives with their family Lives in: House/apartment Has following equipment at home: None  OCCUPATION: Office job  PLOF: Independent  PATIENT GOALS:workout and play tennis without pain  Next MD visit:   OBJECTIVE:   DIAGNOSTIC FINDINGS:  04/09/23 FINDINGS: There is no evidence of fracture or dislocation. There is no evidence of arthropathy or other focal bone abnormality. Soft tissues are unremarkable.   IMPRESSION: No acute displaced fracture or dislocation.  PATIENT SURVEYS:  Patient-Specific Activity Scoring Scheme  0 represents unable to perform. 10 represents able to perform at prior level. 0 1 2 3 4 5 6 7 8 9  10 (Date and Score)   Activity Eval  01/06/24    1. Serving in tennis  5    2. Pull ups 3      3. Bench press 3   4.    5.    Score 3.6    Total score = sum of the activity scores/number of activities Minimum detectable change (90%CI) for average score = 2 points Minimum detectable change (90%CI) for single activity score = 3 points  COGNITION: 01/06/2024 Overall cognitive status: WFL     SENSATION: 01/06/2024 WFL  POSTURE: 01/06/2024 Rounded shoulders and forward head  UPPER EXTREMITY ROM:   ROM Right Eval Active sitting Left Eval Active sitting   Shoulder flexion 162 165  Shoulder extension 50 50  Shoulder abduction 166 170  Shoulder adduction    Shoulder internal rotation    Shoulder external rotation 74 90  Elbow flexion [facility] WFL  Elbow extension    Wrist flexion    Wrist extension    Wrist ulnar deviation    Wrist radial deviation    Wrist pronation    Wrist supination    (Blank rows = not tested)  UPPER EXTREMITY MMT:  MMT Right Eval 01/06/2024 Left Eval 01/06/2024 Right 01/27/2024 Left 01/27/2024   Shoulder flexion 22.9 26.1 29.6 32.1 5/5 58, 56.5 lbs 5/5 85, 78 lbs  Shoulder extension      Shoulder abduction 26.0 25.0 27.5 29.2    Shoulder adduction      Shoulder internal rotation      Shoulder external rotation 26.3 31.2 31.5 29.3 30.5, 35 lbs 36, 43.3 lbs  Middle trapezius      Lower trapezius      Elbow flexion      Elbow extension      Wrist flexion      Wrist extension      Wrist ulnar deviation      Wrist radial deviation      Wrist pronation      Wrist supination      Grip strength (lbs)      (Blank rows = not tested)  SHOULDER SPECIAL TESTS: 01/06/2024 Rotator cuff assessment: Empty can test: positive  Rt  PALPATION:  01/06/2024 TTP: long head of biceps  TODAY'S TREATMENT:                                                                                                     DATE:  01/27/2024 Therex: Verbal review of existing HEP and cues for continued use and progression.  Advice given on gym work out adjustments based off symptom response.  Neuro Re-ed (muscle activation, coordination, scapular control) Prone scapular retraction with bilateral GH ext 5 sec hold x 10  Prone scapular horizontal abduction 5 sec hold x 10 Prone y bilateral 5 sec hold x 10  Standing ER walk outs with towel under arm 15-20 sec holds blue band x 5  Standing ER pulses with blue band around wrist into flexion 2 x 5 with slow movement control.  Standing at wall in push up position blue band in hands 3 point lateral pulls x 10 bilaterally     TODAY'S TREATMENT:                                                                                                    DATE: 01/06/24 Therex: HEP instruction/performance c cues for techniques, handout provided.  Trial set performed of each for comprehension and symptom assessment.  See below for exercise list Self Care:  Discussed gym accommodations to pt's current exercise program    PATIENT EDUCATION: Education details: HEP, POC Person educated: Patient Education method: Explanation, Demonstration, Verbal cues, and Handouts Education comprehension: verbalized understanding, returned demonstration, and verbal cues required  HOME EXERCISE PROGRAM: Access Code: [email or web address] URL: [email or web address] Date: 01/27/2024 Prepared by: Ozell Silvan  Exercises - Doorway Pec Stretch at 90 Degrees Abduction  - 2-3 x daily - 7 x weekly - 3-5 reps - 20 seconds hold - Standing Shoulder Row with Anchored Resistance  - 2-3 x daily - 7 x weekly - 3 sets - 10 reps - 3 seconds hold - Shoulder External Rotation Reactive Isometrics (Mirrored)  - 1-2 x daily - 7 x weekly - 1 sets - 10 reps - 5-10 hold - Prone Scapular Slide with Shoulder Extension  - 2 x daily - 5 x weekly - 1-2 sets - 10 reps -  5 hold - Prone Scapular Retraction Arms at Side  - 1 x daily - 5 x weekly - 1-2 sets - 10 reps - 5 hold - Prone Scapular Retraction Y  - 1 x daily - 5 x weekly - 1-2 sets - 10 reps - 3-5 hold - Shoulder Flexion Serratus Activation with Resistance  - 1 x daily - 7 x weekly - 3 sets - 10 reps  ASSESSMENT:  CLINICAL IMPRESSION: Overall symptoms were reduced.  Focused on stabilization and strengthening with dynamic  movement patters.  Fatigue noted in activity but good targeted response noted from intervention.  Pt was pleased with progressions.  Added to HEP.   OBJECTIVE IMPAIRMENTS: decreased strength, impaired UE functional use, and pain.   ACTIVITY LIMITATIONS: lifting and sleeping  PARTICIPATION LIMITATIONS: community activity and recreation  PERSONAL FACTORS: PMH is unremarkable are also affecting patient's functional outcome.   REHAB POTENTIAL: Excellent  CLINICAL DECISION MAKING: Stable/uncomplicated  EVALUATION COMPLEXITY: Low   GOALS: Goals reviewed with patient? Yes  SHORT TERM GOALS: (target date for Short term goals are 3 weeks 01/27/2024)  1.Patient will demonstrate independent use of home exercise program to maintain progress from in clinic treatments. Goal status: Met 01/27/2024  LONG TERM GOALS: (target dates for all long term goals are 10 weeks  02/27/24 )   1. Patient will demonstrate/report pain at worst less than or equal to 2/10 to facilitate minimal limitation in daily activity secondary to pain symptoms. Goal status: New   2. Patient will demonstrate independent use of home exercise program to facilitate ability to maintain/progress functional gains from skilled physical therapy services. Goal status: New   3. Patient will demonstrate Patient specific functional scale avg > or = 5.6 to indicate reduced disability due to condition.  Goal status: New   4.  Patient will demonstrate 5 pounds in HHD MMT in his Rt UE flexion, ER and abduction throughout to  facilitate lifting, reaching, carrying at PLOF in daily activity.   Goal status: New   5.  Patient will demonstrate Rt  GH joint AROM WFL s symptoms to facilitate usual overhead reaching, self care, dressing at PLOF.    Goal status: New   6.  Pt will be able to serve a tennis ball with pain </= 2/10 in his Rt shoulder. Goal status: New     PLAN:  PT FREQUENCY: 1-x/week  PT DURATION: 8 weeks  PLANNED INTERVENTIONS: Can include 02853- PT Re-evaluation, 97110-Therapeutic exercises, 97530- Therapeutic activity, [id number]- Neuromuscular re-education, 97535- Self Care, 97140- Manual therapy, [phone]- Gait training, [phone]- Orthotic Fit/training, [phone]- Canalith repositioning, [id number]- Aquatic Therapy, [phone]- Electrical stimulation (unattended), [id number] Physical performance testing, 97016- Vasopneumatic device, [id number]- Ultrasound, [id number]- Traction (mechanical), [id number]- Ionotophoresis 4mg /ml Dexamethasone ,  79439 - Needle insertion w/o injection 1 or 2 muscles, 20561 - Needle insertion w/o injection 3 or more muscles.   Patient/Family education, Balance training, Stair training, Taping, Dry Needling, Joint mobilization, Joint manipulation, Spinal manipulation, Spinal mobilization, Scar mobilization, Vestibular training, Visual/preceptual remediation/compensation, DME instructions, Cryotherapy, and Moist heat.  All performed as medically necessary.  All included unless contraindicated  PLAN FOR NEXT SESSION:  DN if pain symptoms present per Pt desires.  Continued strengthening.   Ozell Silvan, PT, DPT, OCS, ATC 01/27/2024  9:30 AM    "

## 2024-01-27 ENCOUNTER — Encounter: Payer: Self-pay | Admitting: Rehabilitative and Restorative Service Providers"

## 2024-01-27 ENCOUNTER — Ambulatory Visit (INDEPENDENT_AMBULATORY_CARE_PROVIDER_SITE_OTHER): Admitting: Rehabilitative and Restorative Service Providers"

## 2024-01-27 DIAGNOSIS — M6281 Muscle weakness (generalized): Secondary | ICD-10-CM

## 2024-01-27 DIAGNOSIS — M25612 Stiffness of left shoulder, not elsewhere classified: Secondary | ICD-10-CM

## 2024-01-27 DIAGNOSIS — M79632 Pain in left forearm: Secondary | ICD-10-CM

## 2024-02-03 ENCOUNTER — Encounter: Payer: Self-pay | Admitting: Physical Therapy

## 2024-02-03 ENCOUNTER — Ambulatory Visit: Admitting: Physical Therapy

## 2024-02-03 DIAGNOSIS — M79632 Pain in left forearm: Secondary | ICD-10-CM

## 2024-02-03 DIAGNOSIS — M6281 Muscle weakness (generalized): Secondary | ICD-10-CM | POA: Diagnosis not present

## 2024-02-03 DIAGNOSIS — M25612 Stiffness of left shoulder, not elsewhere classified: Secondary | ICD-10-CM

## 2024-02-03 NOTE — Therapy (Signed)
 " OUTPATIENT PHYSICAL THERAPY TREATMENT   Patient Name: Jacob [name] MRN: [id number] DOB:Nov 12, 1980, 44 y.o., male Today's Date: 02/03/2024  END OF SESSION:  PT End of Session - 02/03/24 1124     Visit Number 3    Number of Visits 8    Date for Recertification  02/27/24    Authorization Type [employer] 30% coinsurance - auth needed    PT Start Time [phone]    PT Stop Time 0930    PT Time Calculation (min) 38 min    Activity Tolerance Patient tolerated treatment well    Behavior During Therapy [facility] for tasks assessed/performed            Past Medical History:  Diagnosis Date   Anxiety    Community acquired MRSA infection    recurrent, 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 - follows with [facility], neg cx 07/2013   Depression    GERD (gastroesophageal reflux disease)    w/ HH   H/O alcohol abuse    sober since 02/04/2007   Hypothyroid    Past Surgical History:  Procedure Laterality Date   WRIST SURGERY Left    Patient Active Problem List   Diagnosis Date Noted   Acne vulgaris 06/19/2023   Dermatitis 06/19/2023   Encounter for long-term current use of medication 06/19/2023   Rosacea 06/19/2023   Pruritus 06/19/2023   Radial tunnel syndrome, left 06/04/2023   Hypogonadism male 05/15/2023   GAD (generalized anxiety disorder) 04/24/2023   Melanocytic nevi of trunk 02/03/2023   Encounter for general adult medical examination with abnormal findings 02/03/2023   Gastroesophageal reflux disease without esophagitis 01/10/2022   Motion sickness 11/15/2021   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   Rotator cuff tendinitis, right 01/18/2021   Tinea pedis of both feet 12/03/2019   Renal cyst, left 03/24/2016   Routine general medical examination at a health care facility 11/22/2015    PCP: Joshua Debby CROME, MD   REFERRING PROVIDER: Joane Artist RAMAN, MD   REFERRING DIAG:  Diagnosis  M25.511,G89.29 (ICD-10-CM) - Chronic right shoulder pain    THERAPY DIAG:  Muscle weakness (generalized)  Pain  in left forearm  Stiffness of left shoulder, not elsewhere classified  Rationale for Evaluation and Treatment: Rehabilitation  ONSET DATE: about 1 month ago  SUBJECTIVE:                                                                                                                                                                                      SUBJECTIVE STATEMENT: Pt reporting the injection has made a difference. Pt reporting he has been doing his HEP at least 3x week.   PERTINENT HISTORY: history  of Lt wrist surgery resulting from problems arising from repetitive tennis playing.   PAIN:  NPRS scale: no pain reported today Pain location: long head of bicep anterior superior  Rt shoulder Pain description: achy Aggravating factors: serving in tennis, pull ups, working out Relieving factors: Rt shoulder injection, rest  PRECAUTIONS: None  WEIGHT BEARING RESTRICTIONS: No  FALLS:  Has patient fallen in last 6 months? No LIVING ENVIRONMENT: Lives with: lives with their family Lives in: House/apartment Has following equipment at home: None  OCCUPATION: Office job  PLOF: Independent  PATIENT GOALS:workout and play tennis without pain  Next MD visit:   OBJECTIVE:   DIAGNOSTIC FINDINGS:  04/09/23 FINDINGS: There is no evidence of fracture or dislocation. There is no evidence of arthropathy or other focal bone abnormality. Soft tissues are unremarkable.   IMPRESSION: No acute displaced fracture or dislocation.  PATIENT SURVEYS:  Patient-Specific Activity Scoring Scheme  0 represents unable to perform. 10 represents able to perform at prior level. 0 1 2 3 4 5 6 7 8 9  10 (Date and Score)   Activity Eval  01/06/24 02/03/24   1. Serving in tennis  5 7   2. Pull ups 3   7  3. Bench press 3 6  4.    5.    Score 3.6 6.6   Total score = sum of the activity scores/number of activities Minimum detectable change (90%CI) for average score = 2  points Minimum detectable change (90%CI) for single activity score = 3 points  COGNITION: 01/06/2024 Overall cognitive status: WFL     SENSATION: 01/06/2024 WFL  POSTURE: 01/06/2024 Rounded shoulders and forward head  UPPER EXTREMITY ROM:   ROM Right Eval Active sitting Left Eval Active sitting   Shoulder flexion 162 165  Shoulder extension 50 50  Shoulder abduction 166 170  Shoulder adduction    Shoulder internal rotation    Shoulder external rotation 74 90  Elbow flexion [facility] WFL  Elbow extension    Wrist flexion    Wrist extension    Wrist ulnar deviation    Wrist radial deviation    Wrist pronation    Wrist supination    (Blank rows = not tested)  UPPER EXTREMITY MMT:  MMT Right Eval 01/06/2024 Left Eval 01/06/2024 Right 01/27/2024 Left 01/27/2024   Shoulder flexion 22.9 26.1 29.6 32.1 5/5 58, 56.5 lbs 5/5 85, 78 lbs  Shoulder extension      Shoulder abduction 26.0 25.0 27.5 29.2    Shoulder adduction      Shoulder internal rotation      Shoulder external rotation 26.3 31.2 31.5 29.3 30.5, 35 lbs 36, 43.3 lbs  Middle trapezius      Lower trapezius      Elbow flexion      Elbow extension      Wrist flexion      Wrist extension      Wrist ulnar deviation      Wrist radial deviation      Wrist pronation      Wrist supination      Grip strength (lbs)      (Blank rows = not tested)  SHOULDER SPECIAL TESTS: 01/06/2024 Rotator cuff assessment: Empty can test: positive  Rt  PALPATION:  01/06/2024 TTP: long head of biceps  TODAY'S TREATMENT:                                                                                                    DATE:  02/03/2024 Therex: Wall push ups 2 x 10  Standing ER c blue TB 2 x 15 holding 3 sec Standing shoulder abd:  3# 2 x 10  Theractivities:  UBE: 2 minutes each direction Rows: blue TB 2 x 15 holding 3 sec Chest press: blue TB 2 x 15  Standing shoulder extension: blue TB 2 x 15 holding 3 sec Tennis serve c Rt UE using badminton birdies x 5  c instructions in controlled movement patterns Self Care:  We discussed self STM using tennis ball/ lacross ball at home NMR:  Prone Y's over small cones over and back 2 x 10  bil UE's Wall walk up and downc green TB around wrist x 10 Body blade: flexion arc (75-110), abduction arc (0-90) x 5 each     TODAY'S TREATMENT:                                                                                                    DATE:  01/27/2024 Therex: Verbal review of existing HEP and cues for continued use and progression.  Advice given on gym work out adjustments based off symptom response.  Neuro Re-ed (muscle activation, coordination, scapular control) Prone scapular retraction with bilateral GH ext 5 sec hold x 10  Prone scapular horizontal abduction 5 sec hold x 10 Prone y bilateral 5 sec hold x 10  Standing ER walk outs with towel under arm 15-20 sec holds blue band x 5  Standing ER pulses with blue band around wrist into flexion 2 x 5 with slow movement control.  Standing at wall in push up position blue band in hands 3 point lateral pulls x 10 bilaterally     TODAY'S TREATMENT:                                                                                                    DATE: 01/06/24 Therex: HEP instruction/performance c cues for techniques, handout provided.  Trial set performed of each for comprehension and symptom assessment.  See below for exercise list Self Care:  Discussed gym accommodations to  pt's current exercise program    PATIENT EDUCATION: Education details: HEP, POC Person educated: Patient Education method: Explanation, Demonstration, Verbal cues, and Handouts Education comprehension: verbalized understanding, returned  demonstration, and verbal cues required  HOME EXERCISE PROGRAM: Access Code: [email or web address] URL: [email or web address] Date: 01/27/2024 Prepared by: Ozell Silvan  Exercises - Doorway Pec Stretch at 90 Degrees Abduction  - 2-3 x daily - 7 x weekly - 3-5 reps - 20 seconds hold - Standing Shoulder Row with Anchored Resistance  - 2-3 x daily - 7 x weekly - 3 sets - 10 reps - 3 seconds hold - Shoulder External Rotation Reactive Isometrics (Mirrored)  - 1-2 x daily - 7 x weekly - 1 sets - 10 reps - 5-10 hold - Prone Scapular Slide with Shoulder Extension  - 2 x daily - 5 x weekly - 1-2 sets - 10 reps - 5 hold - Prone Scapular Retraction Arms at Side  - 1 x daily - 5 x weekly - 1-2 sets - 10 reps - 5 hold - Prone Scapular Retraction Y  - 1 x daily - 5 x weekly - 1-2 sets - 10 reps - 3-5 hold - Shoulder Flexion Serratus Activation with Resistance  - 1 x daily - 7 x weekly - 3 sets - 10 reps  ASSESSMENT:  CLINICAL IMPRESSION: Treatment still focusing on dynamic strengthening and functional movement patterns in order to return to recreational activities and for improved function. Pt reporting soreness during session today with end ranges of ER and Abduction. Recommending continued skilled PT interventions.     OBJECTIVE IMPAIRMENTS: decreased strength, impaired UE functional use, and pain.   ACTIVITY LIMITATIONS: lifting and sleeping  PARTICIPATION LIMITATIONS: community activity and recreation  PERSONAL FACTORS: PMH is unremarkable are also affecting patient's functional outcome.   REHAB POTENTIAL: Excellent  CLINICAL DECISION MAKING: Stable/uncomplicated  EVALUATION COMPLEXITY: Low   GOALS: Goals reviewed with patient? Yes  SHORT TERM GOALS: (target date for Short term goals are 3 weeks 01/27/2024)  1.Patient will demonstrate independent use of home exercise program to maintain progress from in clinic treatments. Goal status: Met 01/27/2024  LONG TERM GOALS: (target dates  for all long term goals are 10 weeks  02/27/24 )   1. Patient will demonstrate/report pain at worst less than or equal to 2/10 to facilitate minimal limitation in daily activity secondary to pain symptoms. Goal status: New   2. Patient will demonstrate independent use of home exercise program to facilitate ability to maintain/progress functional gains from skilled physical therapy services. Goal status: New   3. Patient will demonstrate Patient specific functional scale avg > or = 5.6 to indicate reduced disability due to condition.  Goal status:Met 02/03/24   4.  Patient will demonstrate 5 pounds in HHD MMT in his Rt UE flexion, ER and abduction throughout to facilitate lifting, reaching, carrying at PLOF in daily activity.   Goal status: New   5.  Patient will demonstrate Rt  GH joint AROM WFL s symptoms to facilitate usual overhead reaching, self care, dressing at PLOF.    Goal status: ongoing 02/03/24   6.  Pt will be able to serve a tennis ball with pain </= 2/10 in his Rt shoulder. Goal status: New     PLAN:  PT FREQUENCY: 1-x/week  PT DURATION: 8 weeks  PLANNED INTERVENTIONS: Can include 02853- PT Re-evaluation, 97110-Therapeutic exercises, 97530- Therapeutic activity, [id number]- Neuromuscular re-education, 97535- Self Care, 97140- Manual therapy, [id number]- Gait training, [phone]- Orthotic Fit/training, [phone]- Canalith repositioning,  02886- Aquatic Therapy, [phone]- Electrical stimulation (unattended), [id number] Physical performance testing, 02983- Vasopneumatic device, [id number]- Ultrasound, [id number]- Traction (mechanical), [id number]- Ionotophoresis 4mg /ml Dexamethasone ,  79439 - Needle insertion w/o injection 1 or 2 muscles, 20561 - Needle insertion w/o injection 3 or more muscles.   Patient/Family education, Balance training, Stair training, Taping, Dry Needling, Joint mobilization, Joint manipulation, Spinal manipulation, Spinal mobilization, Scar mobilization, Vestibular training, Visual/preceptual  remediation/compensation, DME instructions, Cryotherapy, and Moist heat.  All performed as medically necessary.  All included unless contraindicated  PLAN FOR NEXT SESSION:   Continued strengthening in dynamic patterns for return to gym and tennis play   Delon Lunger, PT, MPT 02/03/2024 11:33 AM   02/03/2024  11:33 AM    "

## 2024-02-04 ENCOUNTER — Other Ambulatory Visit: Payer: Self-pay

## 2024-02-04 ENCOUNTER — Other Ambulatory Visit (HOSPITAL_COMMUNITY): Payer: Self-pay

## 2024-02-05 ENCOUNTER — Other Ambulatory Visit (HOSPITAL_COMMUNITY): Payer: Self-pay

## 2024-02-18 ENCOUNTER — Encounter: Admitting: Rehabilitative and Restorative Service Providers"

## 2024-02-20 ENCOUNTER — Other Ambulatory Visit: Payer: Self-pay | Admitting: Internal Medicine

## 2024-02-26 ENCOUNTER — Ambulatory Visit: Admitting: Internal Medicine

## 2024-02-26 ENCOUNTER — Encounter: Payer: Self-pay | Admitting: Internal Medicine

## 2024-02-26 VITALS — BP 118/76 | HR 77 | Temp 97.8°F | Ht 70.0 in | Wt 186.0 lb

## 2024-02-26 DIAGNOSIS — K219 Gastro-esophageal reflux disease without esophagitis: Secondary | ICD-10-CM

## 2024-02-26 DIAGNOSIS — Z0001 Encounter for general adult medical examination with abnormal findings: Secondary | ICD-10-CM

## 2024-02-26 NOTE — Progress Notes (Unsigned)
 "  Subjective:  Patient ID: Jacob [name], male    DOB: Oct 24, 1980  Age: 44 y.o. MRN: [id number]  CC: Annual Exam, Rash, and Hyperlipidemia   HPI Jacob [name] presents for a CPX  ---  Discussed the use of AI scribe software for clinical note transcription with the patient, who gave verbal consent to proceed.  History of Present Illness Jacob [name] is a 44 year old male who presents for an annual check-up and review of his current health status.  He has a history of recurrent staphylococcal skin infections, with recent lesions on his legs. He self-treated with Bactrim  for five days and used nasal mupirocin  and chlorhexidine washes.  During a recent trip to [place] , he experienced symptoms consistent with altitude sickness, including difficulty breathing, elevated heart rate, and nausea. His oxygen saturation was low, and he was treated with Diamox and another medication. Symptoms have since resolved.  He has switched from Clomid  to testosterone  injections, which has improved his libido. He is currently on 70 mg of testosterone  twice a week. He experienced increased hemoglobin levels, leading to blood donation and a subsequent dose adjustment. He is scheduled for blood work to monitor his iron levels and other parameters. No testicular pain or swelling is reported.  He continues to take NP thyroid  medication and reports satisfaction with its effects. He is also on 10 mg of Crestor  daily for cholesterol management, with no significant side effects reported.  His family history includes heart issues in his father, but no prostate cancer. He undergoes regular PSA screenings due to testosterone  therapy, with recent results showing a slight increase but remaining within normal limits. No current breathing difficulties, night sweats, fevers, or chills.     Outpatient Medications Prior to Visit  Medication Sig Dispense Refill   budesonide -formoterol  (SYMBICORT ) 80-4.5 MCG/ACT  inhaler Inhale 2 puffs into the lungs 2 (two) times daily. 30.6 g 3   clomiPHENE  (CLOMID ) 50 MG tablet Take 1 tablet (50 mg total) by mouth daily. 90 tablet 1   DULoxetine  (CYMBALTA ) 30 MG capsule Take 1 capsule (30 mg total) by mouth daily. Must keep appt for future refills 90 capsule 1   hydrOXYzine  (VISTARIL ) 25 MG capsule TAKE 1 CAPSULE BY MOUTH EVERY 6 HOURS AS NEEDED FOR ITCHING. 360 capsule 0   methylPREDNISolone  (MEDROL  DOSEPAK) 4 MG TBPK tablet As directed 21 tablet 0   rosuvastatin  (CRESTOR ) 10 MG tablet Take 1 tablet (10 mg total) by mouth daily. 90 tablet 4   thyroid  (NP THYROID ) 120 MG tablet Take 1 tablet (120 mg total) by mouth every morning, along with 30mg  tablet to equal 150mg  daily 90 tablet 3   thyroid  (NP THYROID ) 30 MG tablet Take 1 tablet (30 mg total) by mouth every morning, along with 120mg  tablet to equal 150mg  daily 90 tablet 3   VOQUEZNA  10 MG TABS Take 1 tablet by mouth daily. 90 tablet 1   No facility-administered medications prior to visit.    ROS Review of Systems  Objective:  BP 118/76   Pulse 77   Temp 97.8 F (36.6 C) (Oral)   Ht 5' 10 (1.778 m)   Wt 186 lb (84.4 kg)   SpO2 97%   BMI 26.69 kg/m   BP Readings from Last 3 Encounters:  02/26/24 118/76  12/23/23 (!) 142/82  08/06/23 124/78    Wt Readings from Last 3 Encounters:  02/26/24 186 lb (84.4 kg)  12/23/23 190 lb (86.2 kg)  08/06/23 175 lb 6.4  oz (79.6 kg)    Physical Exam  Lab Results  Component Value Date   WBC 9.6 06/19/2023   HGB 15.1 06/19/2023   HCT 43.6 06/19/2023   PLT 259.0 06/19/2023   GLUCOSE 66 (L) 06/19/2023   CHOL 166 12/06/2022   TRIG 73 12/06/2022   HDL 68 12/06/2022   LDLCALC 84 12/06/2022   ALT 25 06/19/2023   AST 22 06/19/2023   NA 138 06/19/2023   K 4.1 06/19/2023   CL 104 06/19/2023   CREATININE 1.09 06/19/2023   BUN 22 06/19/2023   CO2 28 06/19/2023   TSH 1.48 06/19/2023   PSA 0.53 01/10/2021   HGBA1C 5.4 12/06/2022    CT CORONARY MORPH  W/CTA COR W/SCORE W/CA W/CM &/OR WO/CM Addendum Date: 12/31/2022 ADDENDUM REPORT: 12/31/2022 23:56 EXAM: OVER-READ INTERPRETATION  CT CHEST The following report is an over-read performed by radiologist Dr. Suzen Dials of [facility] on 12/31/2022. This over-read does not include interpretation of cardiac or coronary anatomy or pathology. The coronary calcium  score/coronary CTA interpretation by the cardiologist is attached. COMPARISON:  March 18, 2019 FINDINGS: Cardiovascular: There are no significant extracardiac vascular findings. Mediastinum/Nodes: There are no enlarged lymph nodes within the visualized mediastinum. Lungs/Pleura: There is no pleural effusion. The visualized lungs appear clear. Upper abdomen: No significant findings in the visualized upper abdomen. Musculoskeletal/Chest wall: No chest wall mass or suspicious osseous findings within the visualized chest. IMPRESSION: No significant extracardiac findings within the visualized chest. Electronically Signed   By: Suzen Dials M.D.   On: 12/31/2022 23:56   Result Date: 12/31/2022 CLINICAL DATA:  44 year old with chest pain EXAM: Cardiac/Coronary  CTA TECHNIQUE: The patient was scanned on a [employer]. FINDINGS: A 100 kV prospective scan was triggered in the descending thoracic aorta at 111 HU's. Axial non-contrast 3 mm slices were carried out through the heart. The data set was analyzed on a dedicated work station and scored using the Agatson method. Gantry rotation speed was 250 msecs and collimation was .6 mm. 0.8 mg of sl NTG was given. The 3D data set was reconstructed in 5% intervals of the 67-82 % of the R-R cycle. Diastolic phases were analyzed on a dedicated work station using MPR, MIP and VRT modes. The patient received 80 cc of contrast. Image quality: good Aorta:  Normal size (28 mm).  No calcifications.  No dissection. Aortic Valve:  Trileaflet.  No calcifications. Coronary Arteries:  Normal coronary  origin.  Right dominance. RCA is a large dominant artery that gives rise to PDA and PLA. There is no plaque. Left main is a large artery that gives rise to LAD and LCX arteries. LAD is a large vessel that has no plaque. D1-large normal LCX is a non-dominant artery.  There is no plaque. OM1-large normal Other findings: Normal pulmonary vein drainage into the left atrium. Normal left atrial appendage without a thrombus. Normal size of the pulmonary artery. Please see radiology report for non cardiac findings. IMPRESSION: 1. Coronary calcium  score of 0. This was <1 percentile for age and sex matched control. 2. Total plaque volume (TPV) 0 mm3. 3. Normal coronary origin with right dominance. 4. No evidence of CAD. CAD-RADS 0. No evidence of CAD (0%). Consider non-atherosclerotic causes of chest pain. Electronically Signed: By: Oneil Parchment M.D. On: 12/18/2022 11:24    Assessment & Plan:  Encounter for general adult medical examination with abnormal findings -     Lipid panel; Future  Follow-up: Return in about 1 year (around 02/25/2025).  Debby Molt, MD "

## 2024-02-26 NOTE — Patient Instructions (Addendum)
 [email or web address]  Health Maintenance, Male Adopting a healthy lifestyle and getting preventive care are important in promoting health and wellness. Ask your health care provider about: The right schedule for you to have regular tests and exams. Things you can do on your own to prevent diseases and keep yourself healthy. What should I know about diet, weight, and exercise? Eat a healthy diet  Eat a diet that includes plenty of vegetables, fruits, low-fat dairy products, and lean protein. Do not eat a lot of foods that are high in solid fats, added sugars, or sodium. Maintain a healthy weight Body mass index (BMI) is a measurement that can be used to identify possible weight problems. It estimates body fat based on height and weight. Your health care provider can help determine your BMI and help you achieve or maintain a healthy weight. Get regular exercise Get regular exercise. This is one of the most important things you can do for your health. Most adults should: Exercise for at least 150 minutes each week. The exercise should increase your heart rate and make you sweat (moderate-intensity exercise). Do strengthening exercises at least twice a week. This is in addition to the moderate-intensity exercise. Spend less time sitting. Even light physical activity can be beneficial. Watch cholesterol and blood lipids Have your blood tested for lipids and cholesterol at 44 years of age, then have this test every 5 years. You may need to have your cholesterol levels checked more often if: Your lipid or cholesterol levels are high. You are older than 45 years of age. You are at high risk for heart disease. What should I know about cancer screening? Many types of cancers can be detected early and may often be prevented. Depending on your health history and family history, you may need to have cancer screening at various ages. This may include screening for: Colorectal cancer. Prostate  cancer. Skin cancer. Lung cancer. What should I know about heart disease, diabetes, and high blood pressure? Blood pressure and heart disease High blood pressure causes heart disease and increases the risk of stroke. This is more likely to develop in people who have high blood pressure readings or are overweight. Talk with your health care provider about your target blood pressure readings. Have your blood pressure checked: Every 3-5 years if you are 44-65 years of age. Every year if you are 44 years of age or older. If you are between the ages of 45 and 24 and are a current or former smoker, ask your health care provider if you should have a one-time screening for abdominal aortic aneurysm (AAA). Diabetes Have regular diabetes screenings. This checks your fasting blood sugar level. Have the screening done: Once every three years after age 64 if you are at a normal weight and have a low risk for diabetes. More often and at a younger age if you are overweight or have a high risk for diabetes. What should I know about preventing infection? Hepatitis B If you have a higher risk for hepatitis B, you should be screened for this virus. Talk with your health care provider to find out if you are at risk for hepatitis B infection. Hepatitis C Blood testing is recommended for: Everyone born from [birth year] through 1965. Anyone with known risk factors for hepatitis C. Sexually transmitted infections (STIs) You should be screened each year for STIs, including gonorrhea and chlamydia, if: You are sexually active and are younger than 44 years of age. You are older than 44 years of age  and your health care provider tells you that you are at risk for this type of infection. Your sexual activity has changed since you were last screened, and you are at increased risk for chlamydia or gonorrhea. Ask your health care provider if you are at risk. Ask your health care provider about whether you are at high risk for HIV.  Your health care provider may recommend a prescription medicine to help prevent HIV infection. If you choose to take medicine to prevent HIV, you should first get tested for HIV. You should then be tested every 3 months for as long as you are taking the medicine. Follow these instructions at home: Alcohol use Do not drink alcohol if your health care provider tells you not to drink. If you drink alcohol: Limit how much you have to 0-2 drinks a day. Know how much alcohol is in your drink. In the [place] one drink equals one 12 oz bottle of beer (355 mL), one 5 oz glass of wine (148 mL), or one 1 oz glass of hard liquor (44 mL). Lifestyle Do not use any products that contain nicotine or tobacco. These products include cigarettes, chewing tobacco, and vaping devices, such as e-cigarettes. If you need help quitting, ask your health care provider. Do not use street drugs. Do not share needles. Ask your health care provider for help if you need support or information about quitting drugs. General instructions Schedule regular health, dental, and eye exams. Stay current with your vaccines. Tell your health care provider if: You often feel depressed. You have ever been abused or do not feel safe at home. Summary Adopting a healthy lifestyle and getting preventive care are important in promoting health and wellness. Follow your health care provider's instructions about healthy diet, exercising, and getting tested or screened for diseases. Follow your health care provider's instructions on monitoring your cholesterol and blood pressure. This information is not intended to replace advice given to you by your health care provider. Make sure you discuss any questions you have with your health care provider. Document Revised: 05/29/2020 Document Reviewed: 05/29/2020 Elsevier Patient Education  2024 [employer].

## 2024-02-27 ENCOUNTER — Encounter: Payer: Self-pay | Admitting: Internal Medicine
